# Patient Record
Sex: Male | Born: 1955
Health system: Southern US, Community
[De-identification: ages and names within clinical notes are randomized; demographics above are authoritative.]

## PROBLEM LIST (undated history)

## (undated) DIAGNOSIS — K449 Diaphragmatic hernia without obstruction or gangrene: Secondary | ICD-10-CM

## (undated) DIAGNOSIS — R51 Headache: Secondary | ICD-10-CM

## (undated) DIAGNOSIS — K219 Gastro-esophageal reflux disease without esophagitis: Secondary | ICD-10-CM

## (undated) DIAGNOSIS — J45909 Unspecified asthma, uncomplicated: Secondary | ICD-10-CM

## (undated) DIAGNOSIS — F329 Major depressive disorder, single episode, unspecified: Secondary | ICD-10-CM

## (undated) DIAGNOSIS — I10 Essential (primary) hypertension: Secondary | ICD-10-CM

## (undated) DIAGNOSIS — F319 Bipolar disorder, unspecified: Secondary | ICD-10-CM

## (undated) DIAGNOSIS — F419 Anxiety disorder, unspecified: Secondary | ICD-10-CM

## (undated) DIAGNOSIS — F32A Depression, unspecified: Secondary | ICD-10-CM

## (undated) DIAGNOSIS — M1990 Unspecified osteoarthritis, unspecified site: Secondary | ICD-10-CM

## (undated) DIAGNOSIS — J449 Chronic obstructive pulmonary disease, unspecified: Secondary | ICD-10-CM

## (undated) HISTORY — DX: Depression, unspecified: F32.A

## (undated) HISTORY — DX: Anxiety disorder, unspecified: F41.9

## (undated) HISTORY — DX: Major depressive disorder, single episode, unspecified: F32.9

## (undated) HISTORY — DX: Unspecified asthma, uncomplicated: J45.909

---

## 1998-09-25 ENCOUNTER — Encounter: Payer: Self-pay | Admitting: Emergency Medicine

## 1998-09-25 ENCOUNTER — Inpatient Hospital Stay (HOSPITAL_COMMUNITY): Admission: EM | Admit: 1998-09-25 | Discharge: 1998-09-27 | Payer: Self-pay | Admitting: Emergency Medicine

## 1998-10-01 ENCOUNTER — Inpatient Hospital Stay (HOSPITAL_COMMUNITY): Admission: AD | Admit: 1998-10-01 | Discharge: 1998-10-02 | Payer: Self-pay | Admitting: *Deleted

## 1998-10-02 ENCOUNTER — Encounter: Payer: Self-pay | Admitting: *Deleted

## 1998-11-05 ENCOUNTER — Emergency Department (HOSPITAL_COMMUNITY): Admission: EM | Admit: 1998-11-05 | Discharge: 1998-11-05 | Payer: Self-pay | Admitting: Emergency Medicine

## 1998-11-05 ENCOUNTER — Encounter: Payer: Self-pay | Admitting: Emergency Medicine

## 2000-05-08 ENCOUNTER — Emergency Department (HOSPITAL_COMMUNITY): Admission: EM | Admit: 2000-05-08 | Discharge: 2000-05-08 | Payer: Self-pay | Admitting: Emergency Medicine

## 2004-06-27 ENCOUNTER — Emergency Department (HOSPITAL_COMMUNITY): Admission: EM | Admit: 2004-06-27 | Discharge: 2004-06-27 | Payer: Self-pay | Admitting: Emergency Medicine

## 2005-01-10 ENCOUNTER — Emergency Department (HOSPITAL_COMMUNITY): Admission: EM | Admit: 2005-01-10 | Discharge: 2005-01-10 | Payer: Self-pay | Admitting: Emergency Medicine

## 2005-10-09 ENCOUNTER — Emergency Department (HOSPITAL_COMMUNITY): Admission: EM | Admit: 2005-10-09 | Discharge: 2005-10-09 | Payer: Self-pay | Admitting: Emergency Medicine

## 2005-10-14 ENCOUNTER — Emergency Department (HOSPITAL_COMMUNITY): Admission: EM | Admit: 2005-10-14 | Discharge: 2005-10-14 | Payer: Self-pay | Admitting: Emergency Medicine

## 2005-11-20 ENCOUNTER — Emergency Department (HOSPITAL_COMMUNITY): Admission: EM | Admit: 2005-11-20 | Discharge: 2005-11-20 | Payer: Self-pay | Admitting: Family Medicine

## 2005-12-06 ENCOUNTER — Emergency Department (HOSPITAL_COMMUNITY): Admission: EM | Admit: 2005-12-06 | Discharge: 2005-12-06 | Payer: Self-pay | Admitting: Emergency Medicine

## 2005-12-24 ENCOUNTER — Emergency Department (HOSPITAL_COMMUNITY): Admission: EM | Admit: 2005-12-24 | Discharge: 2005-12-24 | Payer: Self-pay | Admitting: Emergency Medicine

## 2006-03-27 ENCOUNTER — Emergency Department (HOSPITAL_COMMUNITY): Admission: EM | Admit: 2006-03-27 | Discharge: 2006-03-27 | Payer: Self-pay | Admitting: Emergency Medicine

## 2006-06-23 ENCOUNTER — Emergency Department (HOSPITAL_COMMUNITY): Admission: EM | Admit: 2006-06-23 | Discharge: 2006-06-23 | Payer: Self-pay | Admitting: Emergency Medicine

## 2006-07-25 ENCOUNTER — Emergency Department (HOSPITAL_COMMUNITY): Admission: EM | Admit: 2006-07-25 | Discharge: 2006-07-25 | Payer: Self-pay | Admitting: *Deleted

## 2006-08-01 ENCOUNTER — Emergency Department (HOSPITAL_COMMUNITY): Admission: EM | Admit: 2006-08-01 | Discharge: 2006-08-01 | Payer: Self-pay | Admitting: Emergency Medicine

## 2006-11-16 ENCOUNTER — Encounter: Admission: RE | Admit: 2006-11-16 | Discharge: 2006-11-16 | Payer: Self-pay | Admitting: Orthopaedic Surgery

## 2006-11-21 ENCOUNTER — Ambulatory Visit (HOSPITAL_BASED_OUTPATIENT_CLINIC_OR_DEPARTMENT_OTHER): Admission: RE | Admit: 2006-11-21 | Discharge: 2006-11-21 | Payer: Self-pay | Admitting: Orthopaedic Surgery

## 2008-02-11 ENCOUNTER — Emergency Department (HOSPITAL_COMMUNITY): Admission: EM | Admit: 2008-02-11 | Discharge: 2008-02-11 | Payer: Self-pay | Admitting: Emergency Medicine

## 2008-04-16 ENCOUNTER — Encounter
Admission: RE | Admit: 2008-04-16 | Discharge: 2008-04-16 | Payer: Self-pay | Admitting: Physical Medicine & Rehabilitation

## 2009-01-31 ENCOUNTER — Emergency Department (HOSPITAL_COMMUNITY): Admission: EM | Admit: 2009-01-31 | Discharge: 2009-01-31 | Payer: Self-pay | Admitting: Emergency Medicine

## 2009-02-08 ENCOUNTER — Emergency Department (HOSPITAL_COMMUNITY): Admission: EM | Admit: 2009-02-08 | Discharge: 2009-02-08 | Payer: Self-pay | Admitting: Family Medicine

## 2009-12-21 ENCOUNTER — Emergency Department (HOSPITAL_COMMUNITY): Admission: EM | Admit: 2009-12-21 | Discharge: 2009-12-21 | Payer: Self-pay | Admitting: Emergency Medicine

## 2010-09-11 ENCOUNTER — Emergency Department (HOSPITAL_COMMUNITY): Admission: EM | Admit: 2010-09-11 | Discharge: 2010-09-11 | Payer: Self-pay | Admitting: Emergency Medicine

## 2010-09-13 ENCOUNTER — Emergency Department (HOSPITAL_COMMUNITY): Admission: EM | Admit: 2010-09-13 | Discharge: 2010-09-14 | Payer: Self-pay | Admitting: Emergency Medicine

## 2010-10-19 ENCOUNTER — Emergency Department (HOSPITAL_COMMUNITY)
Admission: EM | Admit: 2010-10-19 | Discharge: 2010-10-19 | Payer: Self-pay | Source: Home / Self Care | Admitting: Emergency Medicine

## 2011-02-09 LAB — POCT URINALYSIS DIP (DEVICE)
Glucose, UA: NEGATIVE mg/dL
Nitrite: NEGATIVE

## 2011-02-17 ENCOUNTER — Ambulatory Visit (HOSPITAL_COMMUNITY)
Admission: RE | Admit: 2011-02-17 | Discharge: 2011-02-17 | Disposition: A | Discharge status: Home / Self Care | Payer: Medicaid Other | Source: Ambulatory Visit | Attending: Family Medicine | Admitting: Family Medicine

## 2011-02-17 DIAGNOSIS — J449 Chronic obstructive pulmonary disease, unspecified: Secondary | ICD-10-CM | POA: Insufficient documentation

## 2011-02-17 DIAGNOSIS — J4489 Other specified chronic obstructive pulmonary disease: Secondary | ICD-10-CM | POA: Insufficient documentation

## 2011-02-17 LAB — PULMONARY FUNCTION TEST

## 2011-03-18 NOTE — Op Note (Signed)
NAME:  Glen Lopez, Glen Lopez NO.:  [id number]   MEDICAL RECORD NO.:  [id number]          PATIENT TYPE:  AMB   LOCATION:  DSC                          FACILITY:  [facility]   PHYSICIAN:  Sharolyn Douglas, M.D.        DATE OF BIRTH:  Mar 01, 1956   DATE OF PROCEDURE:  11/21/2006  DATE OF DISCHARGE:                               OPERATIVE REPORT   DIAGNOSIS:  Lumbar spondylosis and severe back pain.   PROCEDURE:  1. Bilateral L3-S1 facette joint injections.  2. Fluoroscopic imaging used for the above injections during needle      placement.   SURGEON:  Sharolyn Douglas, MD   ASSISTANT:  None.   ANESTHESIA:  MAC plus local.   COMPLICATIONS:  None.   INDICATIONS:  The patient is a pleasant 55 year old male with chronic  severe back pain.  He has failed other conservative treatment  modalities, now presents for facette injections from L3-S1 for further  diagnostic and therapeutic purposes.  Risk, benefits, alternatives  reviewed.   PROCEDURE:  After informed consent taken to the operating room.  He  underwent sedation.  He was turned prone.  Back prepped, draped usual  sterile fashion.  Fluoroscopy brought into the field and oblique imaging  was obtained of the facette joints from L3 to S1.  22 gauge Quincke  spinal needles were advanced into the joints.  Aspiration showed no  blood.  A solution of 20 mg Depo-Medrol and 1 mL preservative-free  lidocaine injected into each joint.  The patient tolerated the procedure  well.  There were no apparent complications.  He was transferred to  recovery in stable condition.  I reviewed post injection instructions  with him.  He was comfortable and he will follow-up in 2 weeks.      Sharolyn Douglas, M.D.  Electronically Signed     MC/MEDQ  D:  11/21/2006  T:  11/21/2006  Job:  [id number]

## 2011-03-18 NOTE — Op Note (Signed)
NAME:  Glen Lopez, Glen Lopez NO.:  [id number]   MEDICAL RECORD NO.:  [id number]          PATIENT TYPE:  AMB   LOCATION:  DSC                          FACILITY:  [facility]   PHYSICIAN:  Sharolyn Douglas, M.D.        DATE OF BIRTH:  September 16, 1956   DATE OF PROCEDURE:  11/21/2006  DATE OF DISCHARGE:  11/21/2006                               OPERATIVE REPORT   DIAGNOSIS:  Lumbar spondylosis and back pain.   PROCEDURES:  1. Bilateral facette injections L3-4, L4-5 and L5-S1.  2. Fluoroscopic imaging used for needle placement in the above      injections.   SURGEON:  Sharolyn Douglas, MD   ASSISTANT:  None.   ANESTHESIA:  MAC plus local.   COMPLICATIONS:  None.   INDICATIONS:  The patient is a pleasant 55 year old male with persistent  back pain thought to be secondary to  lumbar spondylosis now presents  for facette injections as listed above.  The risks, benefits and  alternatives were reviewed.   PROCEDURE:  After informed taken to the operating room.  He was turned  prone.  He underwent sedation by anesthesia.  The fluoroscopy unit was  brought into the field.  The back was prepped and draped in the usual  sterile fashion.  Twenty-two gauge Quincke spinal needles were advanced  into the facette joints bilaterally at L3-4, L4-5 and L5-S1 using  fluoroscopy.  Aspiration showed no blood.  We then injected a solution  of 20 mg Depo-Medrol and 1 mL preservative-free lidocaine in each  facette joint.  The patient tolerated the procedure well with no  apparent complications.  Transferred to recovery in stable condition.  I  reviewed post injection instructions with him.  He will follow-up in 2  weeks.      Sharolyn Douglas, M.D.  Electronically Signed     MC/MEDQ  D:  02/08/2007  T:  02/08/2007  Job:  [id number]

## 2011-06-06 ENCOUNTER — Inpatient Hospital Stay (INDEPENDENT_AMBULATORY_CARE_PROVIDER_SITE_OTHER)
Admission: RE | Admit: 2011-06-06 | Discharge: 2011-06-06 | Disposition: A | Discharge status: Home / Self Care | Payer: Medicaid Other | Source: Ambulatory Visit | Attending: Emergency Medicine | Admitting: Emergency Medicine

## 2011-06-06 ENCOUNTER — Ambulatory Visit (INDEPENDENT_AMBULATORY_CARE_PROVIDER_SITE_OTHER): Payer: Medicaid Other

## 2011-06-06 DIAGNOSIS — S40019A Contusion of unspecified shoulder, initial encounter: Secondary | ICD-10-CM

## 2011-06-06 DIAGNOSIS — M549 Dorsalgia, unspecified: Secondary | ICD-10-CM

## 2011-06-06 DIAGNOSIS — M542 Cervicalgia: Secondary | ICD-10-CM

## 2011-06-06 DIAGNOSIS — M25519 Pain in unspecified shoulder: Secondary | ICD-10-CM

## 2011-07-26 LAB — POCT I-STAT, CHEM 8
BUN: 22
Chloride: 106
Creatinine, Ser: 1.3
HCT: 52
Potassium: 4.6
Sodium: 141

## 2011-07-28 ENCOUNTER — Emergency Department (HOSPITAL_COMMUNITY): Payer: Medicaid Other

## 2011-07-28 ENCOUNTER — Encounter (HOSPITAL_COMMUNITY): Payer: Self-pay | Admitting: Radiology

## 2011-07-28 ENCOUNTER — Emergency Department (HOSPITAL_COMMUNITY)
Admission: EM | Admit: 2011-07-28 | Discharge: 2011-07-28 | Disposition: A | Discharge status: Home / Self Care | Payer: Medicaid Other | Attending: Emergency Medicine | Admitting: Emergency Medicine

## 2011-07-28 DIAGNOSIS — Z79899 Other long term (current) drug therapy: Secondary | ICD-10-CM | POA: Insufficient documentation

## 2011-07-28 DIAGNOSIS — J438 Other emphysema: Secondary | ICD-10-CM | POA: Insufficient documentation

## 2011-07-28 DIAGNOSIS — R079 Chest pain, unspecified: Secondary | ICD-10-CM | POA: Insufficient documentation

## 2011-07-28 DIAGNOSIS — M549 Dorsalgia, unspecified: Secondary | ICD-10-CM | POA: Insufficient documentation

## 2011-07-28 DIAGNOSIS — R109 Unspecified abdominal pain: Secondary | ICD-10-CM | POA: Insufficient documentation

## 2011-07-28 DIAGNOSIS — R112 Nausea with vomiting, unspecified: Secondary | ICD-10-CM | POA: Insufficient documentation

## 2011-07-28 DIAGNOSIS — G8929 Other chronic pain: Secondary | ICD-10-CM | POA: Insufficient documentation

## 2011-07-28 HISTORY — DX: Diaphragmatic hernia without obstruction or gangrene: K44.9

## 2011-07-28 HISTORY — DX: Chronic obstructive pulmonary disease, unspecified: J44.9

## 2011-07-28 LAB — URINALYSIS, ROUTINE W REFLEX MICROSCOPIC
Glucose, UA: NEGATIVE mg/dL
Ketones, ur: NEGATIVE mg/dL
Leukocytes, UA: NEGATIVE
Protein, ur: NEGATIVE mg/dL

## 2011-07-28 LAB — CBC
HCT: 46.9 % (ref 39.0–52.0)
Hemoglobin: 16.6 g/dL (ref 13.0–17.0)
MCH: 31.9 pg (ref 26.0–34.0)
MCV: 90 fL (ref 78.0–100.0)
RBC: 5.21 MIL/uL (ref 4.22–5.81)
RDW: 13.7 % (ref 11.5–15.5)
WBC: 9.6 10*3/uL (ref 4.0–10.5)

## 2011-07-28 LAB — COMPREHENSIVE METABOLIC PANEL
ALT: 28 U/L (ref 0–53)
AST: 23 U/L (ref 0–37)
Albumin: 3.9 g/dL (ref 3.5–5.2)
Calcium: 9.2 mg/dL (ref 8.4–10.5)
Chloride: 104 mEq/L (ref 96–112)
Creatinine, Ser: 1.32 mg/dL (ref 0.50–1.35)
Total Bilirubin: 0.4 mg/dL (ref 0.3–1.2)
Total Protein: 7 g/dL (ref 6.0–8.3)

## 2011-07-28 LAB — DIFFERENTIAL
Basophils Relative: 0 % (ref 0–1)
Eosinophils Absolute: 0.1 10*3/uL (ref 0.0–0.7)
Monocytes Relative: 8 % (ref 3–12)

## 2011-07-28 MED ORDER — IOHEXOL 300 MG/ML  SOLN
100.0000 mL | Freq: Once | INTRAMUSCULAR | Status: AC | PRN
  Administered 2011-07-28: 100 mL via INTRAVENOUS

## 2012-05-22 ENCOUNTER — Emergency Department (HOSPITAL_COMMUNITY)
Admission: EM | Admit: 2012-05-22 | Discharge: 2012-05-22 | Disposition: A | Discharge status: Home / Self Care | Payer: Medicare Other | Attending: Emergency Medicine | Admitting: Emergency Medicine

## 2012-05-22 ENCOUNTER — Encounter (HOSPITAL_COMMUNITY): Payer: Self-pay | Admitting: *Deleted

## 2012-05-22 ENCOUNTER — Emergency Department (HOSPITAL_COMMUNITY): Payer: Medicare Other

## 2012-05-22 DIAGNOSIS — K209 Esophagitis, unspecified without bleeding: Secondary | ICD-10-CM

## 2012-05-22 DIAGNOSIS — R079 Chest pain, unspecified: Secondary | ICD-10-CM | POA: Insufficient documentation

## 2012-05-22 DIAGNOSIS — J4489 Other specified chronic obstructive pulmonary disease: Secondary | ICD-10-CM | POA: Insufficient documentation

## 2012-05-22 DIAGNOSIS — R1013 Epigastric pain: Secondary | ICD-10-CM | POA: Insufficient documentation

## 2012-05-22 DIAGNOSIS — R0600 Dyspnea, unspecified: Secondary | ICD-10-CM

## 2012-05-22 DIAGNOSIS — J449 Chronic obstructive pulmonary disease, unspecified: Secondary | ICD-10-CM | POA: Insufficient documentation

## 2012-05-22 DIAGNOSIS — F17200 Nicotine dependence, unspecified, uncomplicated: Secondary | ICD-10-CM | POA: Insufficient documentation

## 2012-05-22 DIAGNOSIS — R110 Nausea: Secondary | ICD-10-CM | POA: Insufficient documentation

## 2012-05-22 DIAGNOSIS — Z79899 Other long term (current) drug therapy: Secondary | ICD-10-CM | POA: Insufficient documentation

## 2012-05-22 LAB — COMPREHENSIVE METABOLIC PANEL
AST: 21 U/L (ref 0–37)
BUN: 14 mg/dL (ref 6–23)
CO2: 27 mEq/L (ref 19–32)
Calcium: 9.3 mg/dL (ref 8.4–10.5)
Chloride: 102 mEq/L (ref 96–112)
Creatinine, Ser: 1.12 mg/dL (ref 0.50–1.35)
GFR calc non Af Amer: 72 mL/min — ABNORMAL LOW (ref >90)
Total Bilirubin: 0.8 mg/dL (ref 0.3–1.2)

## 2012-05-22 LAB — CBC WITH DIFFERENTIAL/PLATELET
Eosinophils Absolute: 0.1 10*3/uL (ref 0.0–0.7)
Hemoglobin: 17.5 g/dL — ABNORMAL HIGH (ref 13.0–17.0)
Lymphocytes Relative: 21 % (ref 12–46)
Lymphs Abs: 2.4 10*3/uL (ref 0.7–4.0)
MCH: 32.4 pg (ref 26.0–34.0)
Monocytes Relative: 7 % (ref 3–12)
Neutro Abs: 8.1 10*3/uL — ABNORMAL HIGH (ref 1.7–7.7)
Neutrophils Relative %: 71 % (ref 43–77)
Platelets: 147 10*3/uL — ABNORMAL LOW (ref 150–400)
RBC: 5.4 MIL/uL (ref 4.22–5.81)
WBC: 11.4 10*3/uL — ABNORMAL HIGH (ref 4.0–10.5)

## 2012-05-22 LAB — LIPASE, BLOOD: Lipase: 14 U/L (ref 11–59)

## 2012-05-22 LAB — POCT I-STAT TROPONIN I: Troponin i, poc: 0.05 ng/mL (ref 0.00–0.08)

## 2012-05-22 LAB — TROPONIN I: Troponin I: <0.3 ng/mL (ref <0.30)

## 2012-05-22 MED ORDER — GI COCKTAIL ~~LOC~~
30.0000 mL | Freq: Once | ORAL | Status: AC
  Administered 2012-05-22: 30 mL via ORAL
  Filled 2012-05-22: qty 30

## 2012-05-22 MED ORDER — SUCRALFATE 1 GM/10ML PO SUSP: 1.0000 g | Freq: Four times a day (QID) | ORAL | Status: DC

## 2012-05-22 MED ORDER — OMEPRAZOLE 20 MG PO CPDR: 20.0000 mg | DELAYED_RELEASE_CAPSULE | Freq: Every day | ORAL | Status: DC

## 2012-05-22 NOTE — ED Provider Notes (Signed)
History     [name]: [id number]  Arrival date & time 05/22/12  1655   First MD Initiated Contact with Patient 05/22/12 1953      Chief Complaint  Patient presents with  . Chest Pain    (Consider location/radiation/quality/duration/timing/severity/associated sxs/prior treatment) HPI The patient presents with concerns of chest pain.  He notes that his symptoms began 4 days ago, while he was on vacation.  He states that his symptoms consist of burning, aching pain in his epigastrium and sternum.  The pain has become worse over the past few days, and is most pronounced when lying flat.  He notes minor improvement with walking, upright positioning. The patient notes concurrent nausea.  He notes no significant improvement with oral medication, such as H2 blockers, or PPI. He denies ongoing other dyspnea, lightheadedness, syncope, confusion, disorientation. The patient continues to smoke, though he smokes 0.5 packs per day, down from 3 packs per day. Past Medical History  Diagnosis Date  . COPD (chronic obstructive pulmonary disease)   . Hiatal hernia   . Emphysema     History reviewed. No pertinent past surgical history.  No family history on file.  History  Substance Use Topics  . Smoking status: Current Everyday Smoker  . Smokeless tobacco: Not on file  . Alcohol Use: No      Review of Systems  Constitutional:       Per HPI, otherwise negative  HENT:       Per HPI, otherwise negative  Eyes: Negative.   Respiratory:       Per HPI, otherwise negative  Cardiovascular:       Per HPI, otherwise negative  Gastrointestinal: Negative for vomiting.  Genitourinary: Negative.   Musculoskeletal:       Per HPI, otherwise negative  Skin: Negative.   Neurological: Negative for syncope.    Allergies  Codeine  Home Medications   Current Outpatient Rx  Name Route Sig Dispense Refill  . ALBUTEROL SULFATE HFA 108 (90 BASE) MCG/ACT IN AERS Inhalation Inhale 2 puffs into the lungs  every 4 (four) hours as needed. For shortness of breath    . ALBUTEROL SULFATE (2.5 MG/3ML) 0.083% IN NEBU Nebulization Take 2.5 mg by nebulization every 6 (six) hours as needed. For shortness of breath    . ALPRAZOLAM 1 MG PO TABS Oral Take 1 mg by mouth 2 (two) times daily.    . BUDESONIDE-FORMOTEROL FUMARATE 160-4.5 MCG/ACT IN AERO Inhalation Inhale 2 puffs into the lungs 2 (two) times daily.    Marland Kitchen HYDROCODONE-ACETAMINOPHEN 10-325 MG PO TABS Oral Take 1 tablet by mouth 3 (three) times daily.    Marland Kitchen LITHIUM CARBONATE 300 MG PO CAPS Oral Take 300 mg by mouth 3 (three) times daily with meals.      BP 156/88  Pulse 95  Temp 97.8 F (36.6 C) (Oral)  Resp 18  SpO2 92%  Physical Exam  Nursing note and vitals reviewed. Constitutional: He is oriented to person, place, and time. He appears well-developed. No distress.  HENT:  Head: Normocephalic and atraumatic.  Eyes: Conjunctivae and EOM are normal.  Cardiovascular: Normal rate and regular rhythm.   Pulmonary/Chest: Effort normal. No stridor. No respiratory distress.  Abdominal: He exhibits no distension. There is tenderness in the epigastric area.  Musculoskeletal: He exhibits no edema.  Neurological: He is alert and oriented to person, place, and time.  Skin: Skin is warm and dry.  Psychiatric: He has a normal mood and affect.    ED  Course  Procedures (including critical care time)  Labs Reviewed  COMPREHENSIVE METABOLIC PANEL - Abnormal; Notable for the following:    Glucose, Bld 142 (*)     GFR calc non Af Amer 72 (*)     GFR calc Af Amer 84 (*)     All other components within normal limits  CBC WITH DIFFERENTIAL - Abnormal; Notable for the following:    WBC 11.4 (*)     Hemoglobin 17.5 (*)     Platelets 147 (*)     Neutro Abs 8.1 (*)     All other components within normal limits  POCT I-STAT TROPONIN I  TROPONIN I  LIPASE, BLOOD   Dg Chest 2 View  05/22/2012  *RADIOLOGY REPORT*  Clinical Data: Chest pain  CHEST - 2 VIEW   Comparison: 07/28/2011  Findings: COPD with scarring in the lung bases.  Negative for pneumonia.  Negative for heart failure or effusion.  Negative for mass lesion.  IMPRESSION: COPD with pulmonary scarring.  No acute abnormality.  Original Report Authenticated By: Camelia Phenes, M.D.     No diagnosis found.   Cardiac 75 sinus rhythm normal Pulse oximetry 97% room air normal   Date: 05/22/2012  Rate: 99  Rhythm: normal sinus rhythm  QRS Axis: normal  Intervals: normal  ST/T Wave abnormalities: normal  Conduction Disutrbances:none  Narrative Interpretation:   Old EKG Reviewed: none available Unremarkable ecg   On re-eval the patient refers to ED staff as "miracle workers" and notes that he is entirely asymptomatic.  MDM  This 56 year old male presents with days of epigastric and chest pain.  On my exam the patient is in no distress with unremarkable vital signs.  Given the patient's description of symptoms for days there is low suspicion for acute ongoing ischemia, this is also supported by the patient's endorsement of pain that is relieved with exertion.  The patient's description of pain is worse when supine, his history of GERD and is suggestive of acute esophagitis.  The patient's symptoms resolved entirely following a GI cocktail and following an unremarkable laboratory evaluation he was discharged with instructions to follow up with a gastroenterologist as well as his primary care physician for a still necessary cardiac evaluation.    Gerhard Munch, MD 05/22/12 2350

## 2012-05-22 NOTE — ED Notes (Signed)
Pt had some chest pain last week and felt like indigestion.  Pt states last nite it hit him and laid it back and then it came back again.  Pt states it is hurting now [place] beach.  Pt states he has a hernia behind his heart.

## 2012-05-22 NOTE — ED Notes (Signed)
Pt st's pain has subsided after GI cocktail.  Dr. Jeraldine Loots made aware.

## 2012-07-16 ENCOUNTER — Encounter (HOSPITAL_COMMUNITY): Payer: Self-pay

## 2012-07-16 ENCOUNTER — Emergency Department (INDEPENDENT_AMBULATORY_CARE_PROVIDER_SITE_OTHER)
Admission: EM | Admit: 2012-07-16 | Discharge: 2012-07-16 | Disposition: A | Discharge status: Home / Self Care | Payer: Medicare Other | Source: Home / Self Care | Attending: Family Medicine | Admitting: Family Medicine

## 2012-07-16 DIAGNOSIS — M25569 Pain in unspecified knee: Secondary | ICD-10-CM

## 2012-07-16 DIAGNOSIS — M25561 Pain in right knee: Secondary | ICD-10-CM

## 2012-07-16 MED ORDER — KETOROLAC TROMETHAMINE 60 MG/2ML IM SOLN
60.0000 mg | Freq: Once | INTRAMUSCULAR | Status: AC
  Administered 2012-07-16: 60 mg via INTRAMUSCULAR

## 2012-07-16 MED ORDER — KETOROLAC TROMETHAMINE 60 MG/2ML IM SOLN
INTRAMUSCULAR | Status: AC
  Filled 2012-07-16: qty 2

## 2012-07-16 MED ORDER — MELOXICAM 7.5 MG PO TABS: 7.5000 mg | ORAL_TABLET | Freq: Every day | ORAL | Status: DC

## 2012-07-16 NOTE — ED Notes (Signed)
C/O Right knee pain, states that he tripped over a cable wire at home yesterday, pain radiates from knee down.
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
## 2012-07-27 NOTE — ED Provider Notes (Signed)
Medical screening examination/treatment/procedure(s) were performed by resident physician or non-physician practitioner and as supervising physician I was immediately available for consultation/collaboration.   Barkley Bruns MD.    Linna Hoff, MD 07/27/12 [phone]

## 2013-01-29 HISTORY — PX: OTHER SURGICAL HISTORY: SHX169

## 2013-08-29 ENCOUNTER — Encounter (HOSPITAL_COMMUNITY): Payer: Self-pay | Admitting: Emergency Medicine

## 2013-08-29 ENCOUNTER — Emergency Department (HOSPITAL_COMMUNITY)
Admission: EM | Admit: 2013-08-29 | Discharge: 2013-08-29 | Disposition: A | Discharge status: Home / Self Care | Payer: Medicare Other | Attending: Emergency Medicine | Admitting: Emergency Medicine

## 2013-08-29 ENCOUNTER — Emergency Department (HOSPITAL_COMMUNITY): Payer: Medicare Other

## 2013-08-29 DIAGNOSIS — J438 Other emphysema: Secondary | ICD-10-CM | POA: Insufficient documentation

## 2013-08-29 DIAGNOSIS — IMO0002 Reserved for concepts with insufficient information to code with codable children: Secondary | ICD-10-CM | POA: Insufficient documentation

## 2013-08-29 DIAGNOSIS — S40011A Contusion of right shoulder, initial encounter: Secondary | ICD-10-CM

## 2013-08-29 DIAGNOSIS — Z79899 Other long term (current) drug therapy: Secondary | ICD-10-CM | POA: Insufficient documentation

## 2013-08-29 DIAGNOSIS — Z8719 Personal history of other diseases of the digestive system: Secondary | ICD-10-CM | POA: Insufficient documentation

## 2013-08-29 DIAGNOSIS — F17200 Nicotine dependence, unspecified, uncomplicated: Secondary | ICD-10-CM | POA: Insufficient documentation

## 2013-08-29 DIAGNOSIS — Y9301 Activity, walking, marching and hiking: Secondary | ICD-10-CM | POA: Insufficient documentation

## 2013-08-29 DIAGNOSIS — S8391XA Sprain of unspecified site of right knee, initial encounter: Secondary | ICD-10-CM

## 2013-08-29 DIAGNOSIS — S40019A Contusion of unspecified shoulder, initial encounter: Secondary | ICD-10-CM | POA: Insufficient documentation

## 2013-08-29 DIAGNOSIS — Y92410 Unspecified street and highway as the place of occurrence of the external cause: Secondary | ICD-10-CM | POA: Insufficient documentation
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
## 2013-08-29 NOTE — ED Notes (Signed)
Pa  at bedside. 
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
## 2013-09-01 NOTE — ED Provider Notes (Signed)
Medical screening examination/treatment/procedure(s) were performed by non-physician practitioner and as supervising physician I was immediately available for consultation/collaboration.  Kitara Hebb M Lathyn Griggs, MD 09/01/13 1842 

## 2013-10-31 ENCOUNTER — Inpatient Hospital Stay (HOSPITAL_COMMUNITY)
Admission: EM | Admit: 2013-10-31 | Discharge: 2013-11-01 | DRG: 192 | Disposition: A | Discharge status: Home / Self Care | Payer: Medicare Other | Attending: Internal Medicine | Admitting: Internal Medicine

## 2013-10-31 ENCOUNTER — Emergency Department (HOSPITAL_COMMUNITY): Payer: Medicare Other

## 2013-10-31 ENCOUNTER — Encounter (HOSPITAL_COMMUNITY): Payer: Self-pay | Admitting: Emergency Medicine

## 2013-10-31 DIAGNOSIS — K219 Gastro-esophageal reflux disease without esophagitis: Secondary | ICD-10-CM | POA: Diagnosis present

## 2013-10-31 DIAGNOSIS — R0902 Hypoxemia: Secondary | ICD-10-CM | POA: Diagnosis present

## 2013-10-31 DIAGNOSIS — I10 Essential (primary) hypertension: Secondary | ICD-10-CM | POA: Diagnosis present

## 2013-10-31 DIAGNOSIS — J441 Chronic obstructive pulmonary disease with (acute) exacerbation: Principal | ICD-10-CM | POA: Diagnosis present

## 2013-10-31 DIAGNOSIS — J439 Emphysema, unspecified: Secondary | ICD-10-CM | POA: Diagnosis present

## 2013-10-31 DIAGNOSIS — F17200 Nicotine dependence, unspecified, uncomplicated: Secondary | ICD-10-CM | POA: Diagnosis present

## 2013-10-31 DIAGNOSIS — F319 Bipolar disorder, unspecified: Secondary | ICD-10-CM | POA: Diagnosis present

## 2013-10-31 HISTORY — DX: Headache: R51

## 2013-10-31 HISTORY — DX: Essential (primary) hypertension: I10

## 2013-10-31 HISTORY — DX: Gastro-esophageal reflux disease without esophagitis: K21.9

## 2013-10-31 HISTORY — DX: Bipolar disorder, unspecified: F31.9

## 2013-10-31 HISTORY — DX: Unspecified osteoarthritis, unspecified site: M19.90

## 2013-10-31 LAB — BASIC METABOLIC PANEL
BUN: 19 mg/dL (ref 6–23)
CHLORIDE: 96 meq/L (ref 96–112)
CO2: 29 meq/L (ref 19–32)
CREATININE: 1.19 mg/dL (ref 0.50–1.35)
Calcium: 9.3 mg/dL (ref 8.4–10.5)
GFR calc Af Amer: 77 mL/min — ABNORMAL LOW (ref >90)
GFR calc non Af Amer: 66 mL/min — ABNORMAL LOW (ref >90)
Glucose, Bld: 110 mg/dL — ABNORMAL HIGH (ref 70–99)
Potassium: 4.7 mEq/L (ref 3.7–5.3)
Sodium: 139 mEq/L (ref 137–147)

## 2013-10-31 LAB — POCT I-STAT 3, ART BLOOD GAS (G3+)
ACID-BASE EXCESS: 2 mmol/L (ref 0.0–2.0)
Bicarbonate: 30.7 mEq/L — ABNORMAL HIGH (ref 20.0–24.0)
O2 Saturation: 98 %
Patient temperature: 98.9
TCO2: 32 mmol/L (ref 0–100)
pCO2 arterial: 59.3 mmHg (ref 35.0–45.0)
pH, Arterial: 7.324 — ABNORMAL LOW (ref 7.350–7.450)
pO2, Arterial: 120 mmHg — ABNORMAL HIGH (ref 80.0–100.0)

## 2013-10-31 LAB — CBC
HCT: 52.3 % — ABNORMAL HIGH (ref 39.0–52.0)
Hemoglobin: 18.1 g/dL — ABNORMAL HIGH (ref 13.0–17.0)
MCH: 32.6 pg (ref 26.0–34.0)
MCHC: 34.6 g/dL (ref 30.0–36.0)
MCV: 94.1 fL (ref 78.0–100.0)
PLATELETS: 132 10*3/uL — AB (ref 150–400)
RBC: 5.56 MIL/uL (ref 4.22–5.81)
RDW: 14.6 % (ref 11.5–15.5)
WBC: 15.5 10*3/uL — AB (ref 4.0–10.5)

## 2013-10-31 LAB — PRO B NATRIURETIC PEPTIDE: Pro B Natriuretic peptide (BNP): 125.2 pg/mL — ABNORMAL HIGH (ref 0–125)

## 2013-10-31 LAB — POCT I-STAT TROPONIN I: Troponin i, poc: 0.02 ng/mL (ref 0.00–0.08)

## 2013-10-31 MED ORDER — ACETAMINOPHEN 500 MG PO TABS
1000.0000 mg | ORAL_TABLET | Freq: Once | ORAL | Status: AC
Start: 2013-10-31 — End: 2013-10-31
  Administered 2013-10-31: 1000 mg via ORAL
  Filled 2013-10-31: qty 2

## 2013-10-31 MED ORDER — ALPRAZOLAM 0.5 MG PO TABS
1.0000 mg | ORAL_TABLET | Freq: Three times a day (TID) | ORAL | Status: DC | PRN
  Administered 2013-10-31 – 2013-11-01 (×2): 1 mg via ORAL
  Filled 2013-10-31 (×2): qty 4
  Filled 2013-10-31: qty 2

## 2013-10-31 MED ORDER — ALBUTEROL SULFATE (2.5 MG/3ML) 0.083% IN NEBU
2.5000 mg | INHALATION_SOLUTION | RESPIRATORY_TRACT | Status: DC | PRN
  Administered 2013-11-01: 2.5 mg via RESPIRATORY_TRACT
  Filled 2013-10-31: qty 3

## 2013-10-31 MED ORDER — ALBUTEROL SULFATE (2.5 MG/3ML) 0.083% IN NEBU
5.0000 mg | INHALATION_SOLUTION | Freq: Once | RESPIRATORY_TRACT | Status: AC
  Administered 2013-10-31: 5 mg via RESPIRATORY_TRACT
  Filled 2013-10-31: qty 6

## 2013-10-31 MED ORDER — BUDESONIDE-FORMOTEROL FUMARATE 160-4.5 MCG/ACT IN AERO
2.0000 | INHALATION_SPRAY | Freq: Two times a day (BID) | RESPIRATORY_TRACT | Status: DC
  Administered 2013-10-31: 2 via RESPIRATORY_TRACT
  Filled 2013-10-31: qty 6

## 2013-10-31 MED ORDER — IPRATROPIUM BROMIDE 0.02 % IN SOLN
0.5000 mg | RESPIRATORY_TRACT | Status: DC | PRN
  Administered 2013-11-01: 0.5 mg via RESPIRATORY_TRACT
  Filled 2013-10-31: qty 2.5

## 2013-10-31 MED ORDER — HYDROCODONE-ACETAMINOPHEN 10-325 MG PO TABS
1.0000 | ORAL_TABLET | Freq: Three times a day (TID) | ORAL | Status: DC | PRN
  Administered 2013-10-31 – 2013-11-01 (×2): 1 via ORAL
  Filled 2013-10-31 (×2): qty 1

## 2013-10-31 MED ORDER — LEVOFLOXACIN IN D5W 750 MG/150ML IV SOLN
750.0000 mg | Freq: Once | INTRAVENOUS | Status: AC
  Administered 2013-10-31: 750 mg via INTRAVENOUS
  Filled 2013-10-31: qty 150

## 2013-10-31 MED ORDER — ENOXAPARIN SODIUM 40 MG/0.4ML ~~LOC~~ SOLN
40.0000 mg | SUBCUTANEOUS | Status: DC
  Administered 2013-10-31: 40 mg via SUBCUTANEOUS
  Filled 2013-10-31 (×2): qty 0.4

## 2013-10-31 MED ORDER — IPRATROPIUM BROMIDE 0.02 % IN SOLN
0.5000 mg | Freq: Once | RESPIRATORY_TRACT | Status: AC
  Administered 2013-10-31: 0.5 mg via RESPIRATORY_TRACT
  Filled 2013-10-31: qty 2.5

## 2013-10-31 MED ORDER — PREDNISONE 20 MG PO TABS
60.0000 mg | ORAL_TABLET | Freq: Once | ORAL | Status: AC
  Administered 2013-10-31: 60 mg via ORAL
  Filled 2013-10-31: qty 3

## 2013-10-31 MED ORDER — LITHIUM CARBONATE 300 MG PO CAPS
300.0000 mg | ORAL_CAPSULE | Freq: Three times a day (TID) | ORAL | Status: DC
  Filled 2013-10-31 (×3): qty 1

## 2013-10-31 MED ORDER — LEVOFLOXACIN IN D5W 750 MG/150ML IV SOLN
750.0000 mg | INTRAVENOUS | Status: DC
  Filled 2013-10-31: qty 150

## 2013-10-31 MED ORDER — LITHIUM CARBONATE 300 MG PO CAPS: 300.0000 mg | ORAL_CAPSULE | Freq: Three times a day (TID) | ORAL | Status: DC

## 2013-10-31 MED ORDER — ALBUTEROL (5 MG/ML) CONTINUOUS INHALATION SOLN
10.0000 mg/h | INHALATION_SOLUTION | Freq: Once | RESPIRATORY_TRACT | Status: AC
  Administered 2013-10-31: 10 mg/h via RESPIRATORY_TRACT
  Filled 2013-10-31: qty 20

## 2013-10-31 MED ORDER — PANTOPRAZOLE SODIUM 40 MG PO TBEC: 40.0000 mg | DELAYED_RELEASE_TABLET | Freq: Every day | ORAL | Status: DC

## 2013-10-31 MED ORDER — ALBUTEROL SULFATE HFA 108 (90 BASE) MCG/ACT IN AERS: 2.0000 | INHALATION_SPRAY | RESPIRATORY_TRACT | Status: DC | PRN

## 2013-10-31 MED ORDER — METHYLPREDNISOLONE SODIUM SUCC 125 MG IJ SOLR
60.0000 mg | Freq: Four times a day (QID) | INTRAMUSCULAR | Status: DC
  Administered 2013-10-31 – 2013-11-01 (×3): 60 mg via INTRAVENOUS
  Filled 2013-10-31 (×4): qty 0.96
  Filled 2013-10-31: qty 2
  Filled 2013-10-31: qty 0.96
  Filled 2013-10-31: qty 2
  Filled 2013-10-31: qty 0.96

## 2013-10-31 MED ORDER — IPRATROPIUM-ALBUTEROL 0.5-2.5 (3) MG/3ML IN SOLN
3.0000 mL | RESPIRATORY_TRACT | Status: DC
  Administered 2013-10-31 – 2013-11-01 (×3): 3 mL via RESPIRATORY_TRACT
  Filled 2013-10-31 (×3): qty 3

## 2013-10-31 MED ORDER — PANTOPRAZOLE SODIUM 40 MG PO TBEC
40.0000 mg | DELAYED_RELEASE_TABLET | Freq: Every day | ORAL | Status: DC
  Filled 2013-10-31 (×2): qty 1

## 2013-10-31 NOTE — ED Notes (Signed)
The pt has copd and for 2-3 days he has had diff breathing also.  headache

## 2013-10-31 NOTE — ED Notes (Signed)
Pt complaining of back pain, chronic pain.

## 2013-10-31 NOTE — ED Notes (Signed)
Nasal 02 placed at 2 liters

## 2013-10-31 NOTE — H&P (Signed)
[facility] History and Physical  Patient: Glen Lopez  JWJ:[id number]  DOB: 07/10/1956  DOS: the patient was seen and examined on 10/31/2013 PCP: Norberto SorensonSHAW,EVA, MD  Chief Complaint: Shortness of breath and cough  HPI: Glen Lopez is a 58 y.o. male with Past medical history of COPD, hypertension, bipolar disorder. The patient is coming from home. The patient presented with the complaints of shortness of breath and cough that has been ongoing since last 4-5 days. He mentions he has some baseline cough and baseline shortness of breath but since last few days this is progressively worsening associated with whitish sputum production.2 weeks ago he had grown children having similar symptoms.today he mentions that he was feeling disoriented and clumsy and was not able to fully understand his surroundings and was doing things that he would not do it normally.he also complains of frontal headache with some runny nose. Pt denies any fever, chills, chest pain, palpitation, orthopnea, PND, nausea, vomiting, abdominal pain, diarrhea, constipation, active bleeding, burning urination, dizziness, pedal edema,  focal neurological deficit. He is an active smoker smokes one pack of cigarettes a day. No alcohol abuse no signs of aspiration. No recent travel no immobilization  Review of Systems: as mentioned in the history of present illness.  A Comprehensive review of the other systems is negative.  Past Medical History  Diagnosis Date  . COPD (chronic obstructive pulmonary disease)   . Hiatal hernia   . Emphysema   . Emphysema    Past Surgical History  Procedure Laterality Date  . Artheroscopic knee surgery r Right 01/2013   Social History:  reports that he has been smoking Cigarettes.  He has been smoking about 0.50 packs per day. He does not have any smokeless tobacco history on file. He reports that he does not drink alcohol or use illicit drugs. Independent for most of his   ADL.  Allergies  Allergen Reactions  . Codeine Nausea And Vomiting    No family history on file.  Prior to Admission medications   Medication Sig Start Date End Date Taking? Authorizing Provider  albuterol (PROVENTIL HFA;VENTOLIN HFA) 108 (90 BASE) MCG/ACT inhaler Inhale 2 puffs into the lungs every 4 (four) hours as needed. For shortness of breath   Yes Historical Provider, MD  albuterol (PROVENTIL) (2.5 MG/3ML) 0.083% nebulizer solution Take 2.5 mg by nebulization every 6 (six) hours as needed. For shortness of breath   Yes Historical Provider, MD  ALPRAZolam Prudy Feeler(XANAX) 1 MG tablet Take 1 mg by mouth 3 (three) times daily as needed for anxiety.    Yes Historical Provider, MD  atenolol-chlorthalidone (TENORETIC) 100-25 MG per tablet Take 0.5 tablets by mouth daily.   Yes Historical Provider, MD  budesonide-formoterol (SYMBICORT) 160-4.5 MCG/ACT inhaler Inhale 2 puffs into the lungs 2 (two) times daily.   Yes Historical Provider, MD  HYDROcodone-acetaminophen (NORCO) 10-325 MG per tablet Take 1 tablet by mouth 3 (three) times daily as needed for moderate pain.    Yes Historical Provider, MD  lithium carbonate 300 MG capsule Take 300 mg by mouth 3 (three) times daily with meals.   Yes Historical Provider, MD  omeprazole (PRILOSEC) 20 MG capsule Take 20 mg by mouth daily as needed (for acid reflux/indigestion).   Yes Historical Provider, MD    Physical Exam: Filed Vitals:   10/31/13 1846 10/31/13 1900 10/31/13 1930 10/31/13 2000  BP: 136/69 127/67 124/67 121/74  Pulse: 119 113 116 111  Temp:      Resp: 20 28  28 24  Height:      Weight:      SpO2: 89% 85% 82% 95%    General: Alert, Awake and Oriented to Time, Place and Person. Appear in moderate distress Eyes: PERRL ENT: Oral Mucosa clear moist. Neck: no  JVD Cardiovascular: S1 and S2 Present, No  Murmur, Peripheral Pulses Present Respiratory: Bilateral Air entry equal and Decreased, no Crackles, bilateral expiratory wheezes Abdomen:  Bowel Sound Present, Soft and Non tender Skin: no  Rash Extremities: No  Pedal edema,  no calf tenderness Neurologic: Grossly Unremarkable.  Labs on Admission:  CBC:  Recent Labs Lab 10/31/13 1645  WBC 15.5*  HGB 18.1*  HCT 52.3*  MCV 94.1  PLT 132*    CMP     Component Value Date/Time   NA 139 10/31/2013 1645   K 4.7 10/31/2013 1645   CL 96 10/31/2013 1645   CO2 29 10/31/2013 1645   GLUCOSE 110* 10/31/2013 1645   BUN 19 10/31/2013 1645   CREATININE 1.19 10/31/2013 1645   CALCIUM 9.3 10/31/2013 1645   PROT 7.4 05/22/2012 1736   ALBUMIN 4.0 05/22/2012 1736   AST 21 05/22/2012 1736   ALT 25 05/22/2012 1736   ALKPHOS 76 05/22/2012 1736   BILITOT 0.8 05/22/2012 1736   GFRNONAA 66* 10/31/2013 1645   GFRAA 77* 10/31/2013 1645  BNP (last 3 results)  Recent Labs  10/31/13 1645  PROBNP 125.2*    Radiological Exams on Admission: Dg Chest Portable 1 View  10/31/2013   CLINICAL DATA:  Shortness of breath.  EXAM: PORTABLE CHEST - 1 VIEW  COMPARISON:  05/22/2012.  FINDINGS: The cardiac silhouette, mediastinal and hilar contours are within normal limits and stable. The lungs are clear. Mild hyperinflation and probable emphysematous changes. No pleural effusion or pneumothorax. The bony thorax is intact.  IMPRESSION: No acute cardiopulmonary findings.   Electronically Signed   By: Loralie Champagne M.D.   On: 10/31/2013 17:22    EKG: Independently reviewed. sinus tachycardia.  Assessment/Plan Principal Problem:   COPD exacerbation Active Problems:   Essential hypertension, benign   Bipolar disorder, unspecified   1. COPD exacerbation  with an active smoking history the patient is presenting with complaints of cough and shortness of breath with extensive wheezing and severe hypoxia with significantly decreased breath sounds bilaterally. it appears that he has COPD exacerbation of the same at present . With this I will admit him to telemetry IV Solu-Medrol 60 mg every 6 hours , DuoNeb, IV  levofloxacin, oxygen as needed to maintain saturations 88-92%. His ABG does show hypercarbia but it appears chronic and compensated. He is requiring more than 50% FiO2 to maintain his saturations in adequate range at present, he may require closer monitoring If it does not improve with current treatment.  2.hypertension  At present holding his antihypertensive  May Continue his atenolol and chlorthalidone from tomorrow   3. bipolar disorder  Continue lithium   4. GERD   continue Protonix  DVT Prophylaxis: subcutaneous HeparinNutrition: Cardiac diet   Code Status: Full  Family Communication:  wife  was present at bedside, opportunity was given to ask question and all questions were answered satisfactorily at the time of interview. Disposition: Admitted to inpatient in telemetry unit.  Author: Lynden Oxford, MD [facility] Pager: [phone] 10/31/2013, 8:11 PM    If 7PM-7AM, please contact night-coverage [email or web address] Password [email or web address]

## 2013-10-31 NOTE — ED Notes (Addendum)
RT at bedside to change patient to a ventri mask off from NRB. 40% O2.

## 2013-10-31 NOTE — ED Notes (Signed)
Spoke with Admitting MD about lithium medication, dose change and floor request of Stepdown.

## 2013-10-31 NOTE — ED Notes (Signed)
Attempted report. Charge RN will come down to assess patient.
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
## 2013-11-01 ENCOUNTER — Encounter (HOSPITAL_COMMUNITY): Payer: Self-pay | Admitting: General Practice

## 2013-11-01 LAB — COMPREHENSIVE METABOLIC PANEL
ALBUMIN: 3.4 g/dL — AB (ref 3.5–5.2)
ALT: 32 U/L (ref 0–53)
AST: 18 U/L (ref 0–37)
Alkaline Phosphatase: 73 U/L (ref 39–117)
BUN: 20 mg/dL (ref 6–23)
CALCIUM: 9.1 mg/dL (ref 8.4–10.5)
CO2: 27 meq/L (ref 19–32)
Chloride: 97 mEq/L (ref 96–112)
Creatinine, Ser: 1 mg/dL (ref 0.50–1.35)
GFR calc Af Amer: >90 mL/min (ref >90)
GFR calc non Af Amer: 82 mL/min — ABNORMAL LOW (ref >90)
Glucose, Bld: 220 mg/dL — ABNORMAL HIGH (ref 70–99)
Potassium: 4.1 mEq/L (ref 3.7–5.3)
SODIUM: 139 meq/L (ref 137–147)
Total Bilirubin: 0.7 mg/dL (ref 0.3–1.2)
Total Protein: 7.2 g/dL (ref 6.0–8.3)

## 2013-11-01 LAB — CBC WITH DIFFERENTIAL/PLATELET
BASOS PCT: 0 % (ref 0–1)
Basophils Absolute: 0 10*3/uL (ref 0.0–0.1)
EOS PCT: 0 % (ref 0–5)
Eosinophils Absolute: 0 10*3/uL (ref 0.0–0.7)
HEMATOCRIT: 48.6 % (ref 39.0–52.0)
HEMOGLOBIN: 17 g/dL (ref 13.0–17.0)
Lymphocytes Relative: 8 % — ABNORMAL LOW (ref 12–46)
Lymphs Abs: 0.8 10*3/uL (ref 0.7–4.0)
MCH: 32.9 pg (ref 26.0–34.0)
MCHC: 35 g/dL (ref 30.0–36.0)
MCV: 94 fL (ref 78.0–100.0)
Monocytes Absolute: 0.1 10*3/uL (ref 0.1–1.0)
Monocytes Relative: 1 % — ABNORMAL LOW (ref 3–12)
NEUTROS PCT: 91 % — AB (ref 43–77)
Neutro Abs: 9.3 10*3/uL — ABNORMAL HIGH (ref 1.7–7.7)
Platelets: 124 10*3/uL — ABNORMAL LOW (ref 150–400)
RBC: 5.17 MIL/uL (ref 4.22–5.81)
RDW: 14.1 % (ref 11.5–15.5)
WBC: 10.2 10*3/uL (ref 4.0–10.5)

## 2013-11-01 LAB — LEGIONELLA ANTIGEN, URINE: Legionella Antigen, Urine: NEGATIVE

## 2013-11-01 LAB — STREP PNEUMONIAE URINARY ANTIGEN: STREP PNEUMO URINARY ANTIGEN: NEGATIVE

## 2013-11-01 MED ORDER — PREDNISONE 10 MG PO TABS: 10.0000 mg | ORAL_TABLET | Freq: Every day | ORAL | Status: DC

## 2013-11-01 MED ORDER — LITHIUM CARBONATE 300 MG PO CAPS
300.0000 mg | ORAL_CAPSULE | Freq: Three times a day (TID) | ORAL | Status: DC
  Administered 2013-11-01: 300 mg via ORAL
  Filled 2013-11-01 (×4): qty 1

## 2013-11-01 MED ORDER — LEVOFLOXACIN 750 MG PO TABS: 750.0000 mg | ORAL_TABLET | Freq: Every day | ORAL | Status: DC

## 2013-11-01 NOTE — ED Provider Notes (Addendum)
I saw and evaluated the patient, reviewed the resident's note and I agree with the findings and plan.   .Face to face Exam:  General:  Awake HEENT:  Atraumatic Resp:  Wheezing Abd:  Nondistended Neuro:No focal weakness   I reviewed and interpreted the EKG with resident.  Nelia Shiobert L Baylin Cabal, MD 11/21/13 [phone]

## 2013-11-01 NOTE — Discharge Summary (Signed)
Physician Discharge Summary  Glen Lopez ZOX:[id number] DOB: 02/25/1956 DOA: 10/31/2013  PCP: Norberto Sorenson, MD  Admit date: 10/31/2013 Discharge date: 11/01/2013  Time spent: >35 minutes  Recommendations for Outpatient Follow-up:  F/u with PCP next week Discharge Diagnoses:  Principal Problem:   COPD exacerbation Active Problems:   Essential hypertension, benign   Bipolar disorder, unspecified   Discharge Condition: stable   Diet recommendation: heart healthy   Filed Weights   10/31/13 1628 10/31/13 2317 11/01/13 0456  Weight: 77.111 kg (170 lb) 82.01 kg (180 lb 12.8 oz) 81.557 kg (179 lb 12.8 oz)    History of present illness:  58 y.o. male with Past medical history of COPD, hypertension, bipolar disorder. The patient is coming from home. The patient presented with the complaints of shortness of breath and cough that has been ongoing since last 4-5 days. He mentions he has some baseline cough and baseline shortness of breath but since last few days this is progressively worsening associated with whitish sputum production.2 weeks ago he had grown children having similar symptoms   Hospital Course:  Patient started on IV steroids, antibiotics, bronchodilators with clinical improvement; he is requesting top go home today; recommended to cont PO steroid taper, atx, inhalers, and f/u with PCP next week; d/w to stop smoking; will perform desat study prior to discharge    Procedures:  CXR (i.e. Studies not automatically included, echos, thoracentesis, etc; not x-rays)  Consultations:  None   Discharge Exam: Filed Vitals:   11/01/13 0853  BP: 140/72  Pulse: 102  Temp: 97.8 F (36.6 C)  Resp:     General: alert Cardiovascular: s1,s2 rrr Respiratory: CTA BL  Discharge Instructions  Discharge Orders   Future Orders Complete By Expires   Diet - low sodium heart healthy  As directed    Discharge instructions  As directed    Comments:     Please follow up with primary  care doctor in 1 week   Increase activity slowly  As directed        Medication List         albuterol 108 (90 BASE) MCG/ACT inhaler  Commonly known as:  PROVENTIL HFA;VENTOLIN HFA  Inhale 2 puffs into the lungs every 4 (four) hours as needed. For shortness of breath     albuterol (2.5 MG/3ML) 0.083% nebulizer solution  Commonly known as:  PROVENTIL  Take 2.5 mg by nebulization every 6 (six) hours as needed. For shortness of breath     ALPRAZolam 1 MG tablet  Commonly known as:  XANAX  Take 1 mg by mouth 3 (three) times daily as needed for anxiety.     atenolol-chlorthalidone 100-25 MG per tablet  Commonly known as:  TENORETIC  Take 0.5 tablets by mouth daily.     budesonide-formoterol 160-4.5 MCG/ACT inhaler  Commonly known as:  SYMBICORT  Inhale 2 puffs into the lungs 2 (two) times daily.     HYDROcodone-acetaminophen 10-325 MG per tablet  Commonly known as:  NORCO  Take 1 tablet by mouth 3 (three) times daily as needed for moderate pain.     levofloxacin 750 MG tablet  Commonly known as:  LEVAQUIN  Take 1 tablet (750 mg total) by mouth daily.     lithium carbonate 300 MG capsule  Take 300 mg by mouth 3 (three) times daily with meals.     omeprazole 20 MG capsule  Commonly known as:  PRILOSEC  Take 20 mg by mouth daily as needed (for acid reflux/indigestion).  predniSONE 10 MG tablet  Commonly known as:  DELTASONE  Take 1 tablet (10 mg total) by mouth daily with breakfast.       Allergies  Allergen Reactions  . Codeine Nausea And Vomiting       Follow-up Information   Follow up with SHAW,EVA, MD. Schedule an appointment as soon as possible for a visit in 1 week.   Specialty:  Family Medicine   Contact information:   [address] [place] [id number] [phone]        The results of significant diagnostics from this hospitalization (including imaging, microbiology, ancillary and laboratory) are listed below for reference.    Significant  Diagnostic Studies: Dg Chest Portable 1 View  10/31/2013   CLINICAL DATA:  Shortness of breath.  EXAM: PORTABLE CHEST - 1 VIEW  COMPARISON:  05/22/2012.  FINDINGS: The cardiac silhouette, mediastinal and hilar contours are within normal limits and stable. The lungs are clear. Mild hyperinflation and probable emphysematous changes. No pleural effusion or pneumothorax. The bony thorax is intact.  IMPRESSION: No acute cardiopulmonary findings.   Electronically Signed   By: Loralie ChampagneMark  Gallerani M.D.   On: 10/31/2013 17:22    Microbiology: No results found for this or any previous visit (from the past 240 hour(s)).   Labs: Basic Metabolic Panel:  Recent Labs Lab 10/31/13 1645 11/01/13 0556  NA 139 139  K 4.7 4.1  CL 96 97  CO2 29 27  GLUCOSE 110* 220*  BUN 19 20  CREATININE 1.19 1.00  CALCIUM 9.3 9.1   Liver Function Tests:  Recent Labs Lab 11/01/13 0556  AST 18  ALT 32  ALKPHOS 73  BILITOT 0.7  PROT 7.2  ALBUMIN 3.4*   No results found for this basename: LIPASE, AMYLASE,  in the last 168 hours No results found for this basename: AMMONIA,  in the last 168 hours CBC:  Recent Labs Lab 10/31/13 1645 11/01/13 0556  WBC 15.5* 10.2  NEUTROABS  --  9.3*  HGB 18.1* 17.0  HCT 52.3* 48.6  MCV 94.1 94.0  PLT 132* 124*   Cardiac Enzymes: No results found for this basename: CKTOTAL, CKMB, CKMBINDEX, TROPONINI,  in the last 168 hours BNP: BNP (last 3 results)  Recent Labs  10/31/13 1645  PROBNP 125.2*   CBG: No results found for this basename: GLUCAP,  in the last 168 hours     Signed:  Jonette MateBURIEV, Biridiana Twardowski N  [facility] 11/01/2013, 11:48 AM

## 2013-11-01 NOTE — Progress Notes (Signed)
Went over all d/c info with pt and wife.

## 2013-11-01 NOTE — Progress Notes (Addendum)
Pt discharged per w/c accompanied by Nurse tech and wife. Pt has d/c info when next meds are due, scripts and follow up appts. Also 02 tank from [facility] on 4 l n/c

## 2013-11-01 NOTE — Progress Notes (Signed)
[facility] here to deliver tank for pt and discuss proper use.

## 2013-11-01 NOTE — Progress Notes (Signed)
Call to [facility] for oxygen tank and instruction see previous note for sat results on RA and while ambulating.

## 2013-11-01 NOTE — Progress Notes (Signed)
SATURATION QUALIFICATIONS: (This note is used to comply with regulatory documentation for home oxygen)  Patient Saturations on Room Air at Rest =84 %  Patient Saturations on Room Air while Ambulating = 82  Patient Saturations on 4 Liters of oxygen while Ambulating = 91 %  Please briefly explain why patient needs home oxygen: Emphysema per CXR and desaturation  on RA

## 2013-11-01 NOTE — Progress Notes (Signed)
Pt. Arrived to the floor via stretcher in alert and stable condition. Pt. Oriented to room and call system. Call light within reach. Wife at bedside. Pt. O2 saturation 91% (40% venturi mask). No distress or discomfort noted. Pt. Stated he has constant back pain 5/10 in lower back that he had received pain medication for before arriving to unit. Pain is tolerable at this time. RN will continue to monitor pt. For changes in condition. Anai Lipson, Cheryll DessertKaren Cherrell

## 2014-03-06 ENCOUNTER — Emergency Department (HOSPITAL_COMMUNITY)
Admission: EM | Admit: 2014-03-06 | Discharge: 2014-03-06 | Disposition: A | Discharge status: Home / Self Care | Payer: Medicare HMO | Attending: Emergency Medicine | Admitting: Emergency Medicine

## 2014-03-06 ENCOUNTER — Emergency Department (HOSPITAL_COMMUNITY): Payer: Medicare HMO

## 2014-03-06 ENCOUNTER — Encounter (HOSPITAL_COMMUNITY): Payer: Self-pay | Admitting: Emergency Medicine

## 2014-03-06 DIAGNOSIS — M25569 Pain in unspecified knee: Secondary | ICD-10-CM

## 2014-03-06 DIAGNOSIS — W010XXA Fall on same level from slipping, tripping and stumbling without subsequent striking against object, initial encounter: Secondary | ICD-10-CM | POA: Insufficient documentation

## 2014-03-06 DIAGNOSIS — M129 Arthropathy, unspecified: Secondary | ICD-10-CM | POA: Insufficient documentation

## 2014-03-06 DIAGNOSIS — IMO0002 Reserved for concepts with insufficient information to code with codable children: Secondary | ICD-10-CM | POA: Insufficient documentation

## 2014-03-06 DIAGNOSIS — S79929A Unspecified injury of unspecified thigh, initial encounter: Secondary | ICD-10-CM | POA: Insufficient documentation

## 2014-03-06 DIAGNOSIS — Z79899 Other long term (current) drug therapy: Secondary | ICD-10-CM | POA: Insufficient documentation

## 2014-03-06 DIAGNOSIS — K219 Gastro-esophageal reflux disease without esophagitis: Secondary | ICD-10-CM | POA: Insufficient documentation

## 2014-03-06 DIAGNOSIS — F17200 Nicotine dependence, unspecified, uncomplicated: Secondary | ICD-10-CM | POA: Insufficient documentation

## 2014-03-06 DIAGNOSIS — I10 Essential (primary) hypertension: Secondary | ICD-10-CM | POA: Insufficient documentation

## 2014-03-06 DIAGNOSIS — F319 Bipolar disorder, unspecified: Secondary | ICD-10-CM | POA: Insufficient documentation

## 2014-03-06 DIAGNOSIS — R000 Tachycardia, unspecified: Secondary | ICD-10-CM | POA: Insufficient documentation

## 2014-03-06 DIAGNOSIS — S99919A Unspecified injury of unspecified ankle, initial encounter: Secondary | ICD-10-CM

## 2014-03-06 DIAGNOSIS — S8990XA Unspecified injury of unspecified lower leg, initial encounter: Secondary | ICD-10-CM | POA: Insufficient documentation

## 2014-03-06 DIAGNOSIS — J438 Other emphysema: Secondary | ICD-10-CM | POA: Insufficient documentation

## 2014-03-06 DIAGNOSIS — Y9229 Other specified public building as the place of occurrence of the external cause: Secondary | ICD-10-CM | POA: Insufficient documentation

## 2014-03-06 DIAGNOSIS — S79919A Unspecified injury of unspecified hip, initial encounter: Secondary | ICD-10-CM | POA: Insufficient documentation

## 2014-03-06 DIAGNOSIS — M25559 Pain in unspecified hip: Secondary | ICD-10-CM

## 2014-03-06 DIAGNOSIS — Y939 Activity, unspecified: Secondary | ICD-10-CM | POA: Insufficient documentation

## 2014-03-06 DIAGNOSIS — S99929A Unspecified injury of unspecified foot, initial encounter: Secondary | ICD-10-CM

## 2014-03-06 MED ORDER — OXYCODONE-ACETAMINOPHEN 5-325 MG PO TABS
2.0000 | ORAL_TABLET | Freq: Once | ORAL | Status: AC
  Administered 2014-03-06: 2 via ORAL
  Filled 2014-03-06: qty 2

## 2014-03-06 MED ORDER — OXYCODONE-ACETAMINOPHEN 5-325 MG PO TABS: 1.0000 | ORAL_TABLET | Freq: Three times a day (TID) | ORAL | Status: DC | PRN

## 2014-03-06 NOTE — Discharge Instructions (Signed)
Arthralgia  Your caregiver has diagnosed you as suffering from an arthralgia. Arthralgia means there is pain in a joint. This can come from many reasons including:  · Bruising the joint which causes soreness (inflammation) in the joint.  · Wear and tear on the joints which occur as we grow older (osteoarthritis).  · Overusing the joint.  · Various forms of arthritis.  · Infections of the joint.  Regardless of the cause of pain in your joint, most of these different pains respond to anti-inflammatory drugs and rest. The exception to this is when a joint is infected, and these cases are treated with antibiotics, if it is a bacterial infection.  HOME CARE INSTRUCTIONS   · Rest the injured area for as long as directed by your caregiver. Then slowly start using the joint as directed by your caregiver and as the pain allows. Crutches as directed may be useful if the ankles, knees or hips are involved. If the knee was splinted or casted, continue use and care as directed. If an stretchy or elastic wrapping bandage has been applied today, it should be removed and re-applied every 3 to 4 hours. It should not be applied tightly, but firmly enough to keep swelling down. Watch toes and feet for swelling, bluish discoloration, coldness, numbness or excessive pain. If any of these problems (symptoms) occur, remove the ace bandage and re-apply more loosely. If these symptoms persist, contact your caregiver or return to this location.  · For the first 24 hours, keep the injured extremity elevated on pillows while lying down.  · Apply ice for 15-20 minutes to the sore joint every couple hours while awake for the first half day. Then 03-04 times per day for the first 48 hours. Put the ice in a plastic bag and place a towel between the bag of ice and your skin.  · Wear any splinting, casting, elastic bandage applications, or slings as instructed.  · Only take over-the-counter or prescription medicines for pain, discomfort, or fever as  directed by your caregiver. Do not use aspirin immediately after the injury unless instructed by your physician. Aspirin can cause increased bleeding and bruising of the tissues.  · If you were given crutches, continue to use them as instructed and do not resume weight bearing on the sore joint until instructed.  Persistent pain and inability to use the sore joint as directed for more than 2 to 3 days are warning signs indicating that you should see a caregiver for a follow-up visit as soon as possible. Initially, a hairline fracture (break in bone) may not be evident on X-rays. Persistent pain and swelling indicate that further evaluation, non-weight bearing or use of the joint (use of crutches or slings as instructed), or further X-rays are indicated. X-rays may sometimes not show a small fracture until a week or 10 days later. Make a follow-up appointment with your own caregiver or one to whom we have referred you. A radiologist (specialist in reading X-rays) may read your X-rays. Make sure you know how you are to obtain your X-ray results. Do not assume everything is normal if you do not hear from us.  SEEK MEDICAL CARE IF:  Bruising, swelling, or pain increases.  SEEK IMMEDIATE MEDICAL CARE IF:   · Your fingers or toes are numb or blue.  · The pain is not responding to medications and continues to stay the same or get worse.  · The pain in your joint becomes severe.  · You   develop a fever over 102° F (38.9° C).  · It becomes impossible to move or use the joint.  MAKE SURE YOU:   · Understand these instructions.  · Will watch your condition.  · Will get help right away if you are not doing well or get worse.  Document Released: 10/17/2005 Document Revised: 01/09/2012 Document Reviewed: 06/04/2008  [employer]® Patient Information ©2014 [employer].

## 2014-03-06 NOTE — ED Provider Notes (Signed)
[name]: [id number]     Arrival date & time 03/06/14  1911 History   First MD Initiated Contact with Patient 03/06/14 2002     Chief Complaint  Patient presents with  . Fall  . Hip Pain     (Consider location/radiation/quality/duration/timing/severity/associated sxs/prior Treatment) HPI Comments: Patient presents to the emergency department with chief complaint of left hip, and left knee pain. He states that he slipped and fell yesterday the grocery store. He has tried using Vicodin with no relief. He states the pain is aggravated with ambulation and movement. He denies any alleviating factors. Denies fevers, chills, nausea, or vomiting.  The history is provided by the patient. No language interpreter was used.    Past Medical History  Diagnosis Date  . COPD (chronic obstructive pulmonary disease)   . Hiatal hernia   . Emphysema   . Emphysema   . Bipolar disorder   . Hypertension   . Shortness of breath   . GERD (gastroesophageal reflux disease)   . Headache(784.0)   . Arthritis    Past Surgical History  Procedure Laterality Date  . Artheroscopic knee surgery r Right 01/2013   History reviewed. No pertinent family history. History  Substance Use Topics  . Smoking status: Current Every Day Smoker -- 0.50 packs/day for 43 years    Types: Cigarettes  . Smokeless tobacco: Never Used  . Alcohol Use: No    Review of Systems  Constitutional: Negative for fever and chills.  Respiratory: Negative for shortness of breath.   Cardiovascular: Negative for chest pain.  Gastrointestinal: Negative for nausea, vomiting, diarrhea and constipation.  Genitourinary: Negative for dysuria.  Musculoskeletal: Positive for arthralgias, joint swelling and myalgias.      Allergies  Codeine  Home Medications   Prior to Admission medications   Medication Sig Start Date End Date Taking? Authorizing Provider  albuterol (PROVENTIL HFA;VENTOLIN HFA) 108 (90 BASE) MCG/ACT inhaler Inhale 2 puffs  into the lungs every 4 (four) hours as needed. For shortness of breath   Yes Historical Provider, MD  albuterol (PROVENTIL) (2.5 MG/3ML) 0.083% nebulizer solution Take 2.5 mg by nebulization every 6 (six) hours as needed. For shortness of breath   Yes Historical Provider, MD  ALPRAZolam Prudy Feeler(XANAX) 1 MG tablet Take 1 mg by mouth 3 (three) times daily as needed for anxiety.    Yes Historical Provider, MD  atenolol-chlorthalidone (TENORETIC) 100-25 MG per tablet Take 0.5 tablets by mouth daily.   Yes Historical Provider, MD  budesonide-formoterol (SYMBICORT) 160-4.5 MCG/ACT inhaler Inhale 2 puffs into the lungs 2 (two) times daily.   Yes Historical Provider, MD  HYDROcodone-acetaminophen (NORCO) 10-325 MG per tablet Take 0.5-1 tablets by mouth 3 (three) times daily as needed for moderate pain.    Yes Historical Provider, MD  lithium carbonate 300 MG capsule Take 300 mg by mouth 3 (three) times daily with meals.   Yes Historical Provider, MD  Omega-3 Fatty Acids (FISH OIL) 500 MG CAPS Take 500 mg by mouth daily.   Yes Historical Provider, MD  omeprazole (PRILOSEC) 20 MG capsule Take 20 mg by mouth daily as needed (for acid reflux/indigestion).   Yes Historical Provider, MD   BP 151/74  Pulse 125  Temp(Src) 99 F (37.2 C) (Oral)  Resp 24  SpO2 90% Physical Exam  Nursing note and vitals reviewed. Constitutional: He is oriented to person, place, and time. He appears well-developed and well-nourished.  HENT:  Head: Normocephalic and atraumatic.  Eyes: Conjunctivae and EOM are normal. Pupils are  equal, round, and reactive to light. Right eye exhibits no discharge. Left eye exhibits no discharge. No scleral icterus.  Neck: Normal range of motion. Neck supple. No JVD present.  Cardiovascular: Regular rhythm and normal heart sounds.  Exam reveals no gallop and no friction rub.   No murmur heard. Tachycardic  Pulmonary/Chest: Effort normal and breath sounds normal. No respiratory distress. He has no  wheezes. He has no rales. He exhibits no tenderness.  Abdominal: Soft. He exhibits no distension and no mass. There is no tenderness. There is no rebound and no guarding.  Musculoskeletal: Normal range of motion. He exhibits no edema and no tenderness.  Range of motion and strength reduced secondary to pain, some bony tenderness over the lateral hip, no bony tenderness over the lateral knee.  Neurological: He is alert and oriented to person, place, and time.  Skin: Skin is warm and dry.  Psychiatric: He has a normal mood and affect. His behavior is normal. Judgment and thought content normal.    ED Course  Procedures (including critical care time) Labs Review Labs Reviewed - No data to display  Imaging Review Dg Hip Complete Left  03/06/2014   CLINICAL DATA:  Larey SeatFell today. Lateral hip pain. Pain worse with weight-bearing and flexing. No prior injury.  EXAM: LEFT HIP - COMPLETE 2+ VIEW  COMPARISON:  None.  FINDINGS: There is no evidence of hip fracture or dislocation. There is no evidence of arthropathy or other focal bone abnormality.  IMPRESSION: Negative.   Electronically Signed   By: Rosalie GumsBeth  Brown M.D.   On: 03/06/2014 21:42   Dg Knee Complete 4 Views Left  03/06/2014   CLINICAL DATA:  Fall  EXAM: LEFT KNEE - COMPLETE 4+ VIEW  COMPARISON:  None.  FINDINGS: There is no evidence of fracture, dislocation, or joint effusion. There is no evidence of arthropathy or other focal bone abnormality. Soft tissues are unremarkable.  IMPRESSION: Negative.   Electronically Signed   By: Elige KoHetal  Patel   On: 03/06/2014 21:45     EKG Interpretation None      MDM   Final diagnoses:  Hip pain  Knee pain    Patient with knee pain and hip pain following a fall.  Negative plain films.  DC to home with ortho follow-up.  Patient is stable and ready for discharge.  I personally performed the services described in this documentation, which was scribed in my presence. The recorded information has been reviewed  and is accurate.  Filed Vitals:   03/06/14 2237  BP:   Pulse: 88  Temp:   Resp:       Roxy Horsemanobert Dalaina Tates, PA-C 03/06/14 2259

## 2014-03-06 NOTE — ED Notes (Signed)
Pt states he slipped then fell catching himself before his hit the ground yesterday [place] [employer]. Pt states that his left hip and left knee are hurting him. Pt states he tried OTC medication with no relief. Pt ambulatory.

## 2014-03-10 NOTE — ED Provider Notes (Signed)
Medical screening examination/treatment/procedure(s) were performed by non-physician practitioner and as supervising physician I was immediately available for consultation/collaboration.   EKG Interpretation None        Shelda JakesScott W. Malayasia Mirkin, MD 03/10/14 [phone]

## 2014-03-21 ENCOUNTER — Institutional Professional Consult (permissible substitution): Payer: Medicare Other | Admitting: Emergency Medicine

## 2014-03-27 ENCOUNTER — Institutional Professional Consult (permissible substitution): Payer: Medicare Other | Admitting: Emergency Medicine

## 2014-04-02 ENCOUNTER — Telehealth: Payer: Self-pay

## 2014-04-02 NOTE — Telephone Encounter (Signed)
Glen Lopez from home health care called because she received a fax from [facility] for pt's oxygen form, that only had a circle on the page, and nothing else. Would like to know if she can refax the paper work

## 2014-04-02 NOTE — Telephone Encounter (Signed)
Spoke to [place]  And she will refax the ppw for Dr. Clelia Croft to complete.

## 2014-06-25 ENCOUNTER — Emergency Department (HOSPITAL_COMMUNITY): Payer: Medicare HMO

## 2014-06-25 ENCOUNTER — Emergency Department (HOSPITAL_COMMUNITY)
Admission: EM | Admit: 2014-06-25 | Discharge: 2014-06-25 | Disposition: A | Discharge status: Home / Self Care | Payer: Medicare HMO | Attending: Emergency Medicine | Admitting: Emergency Medicine

## 2014-06-25 ENCOUNTER — Encounter (HOSPITAL_COMMUNITY): Payer: Self-pay | Admitting: Emergency Medicine

## 2014-06-25 DIAGNOSIS — Z79899 Other long term (current) drug therapy: Secondary | ICD-10-CM | POA: Insufficient documentation

## 2014-06-25 DIAGNOSIS — M129 Arthropathy, unspecified: Secondary | ICD-10-CM | POA: Insufficient documentation

## 2014-06-25 DIAGNOSIS — R000 Tachycardia, unspecified: Secondary | ICD-10-CM | POA: Insufficient documentation

## 2014-06-25 DIAGNOSIS — S8990XA Unspecified injury of unspecified lower leg, initial encounter: Secondary | ICD-10-CM | POA: Diagnosis not present

## 2014-06-25 DIAGNOSIS — X500XXA Overexertion from strenuous movement or load, initial encounter: Secondary | ICD-10-CM | POA: Diagnosis not present

## 2014-06-25 DIAGNOSIS — Y9389 Activity, other specified: Secondary | ICD-10-CM | POA: Insufficient documentation

## 2014-06-25 DIAGNOSIS — S99919A Unspecified injury of unspecified ankle, initial encounter: Principal | ICD-10-CM

## 2014-06-25 DIAGNOSIS — I10 Essential (primary) hypertension: Secondary | ICD-10-CM | POA: Insufficient documentation

## 2014-06-25 DIAGNOSIS — K219 Gastro-esophageal reflux disease without esophagitis: Secondary | ICD-10-CM | POA: Diagnosis not present

## 2014-06-25 DIAGNOSIS — Z9889 Other specified postprocedural states: Secondary | ICD-10-CM | POA: Diagnosis not present

## 2014-06-25 DIAGNOSIS — F319 Bipolar disorder, unspecified: Secondary | ICD-10-CM | POA: Diagnosis not present

## 2014-06-25 DIAGNOSIS — Y9289 Other specified places as the place of occurrence of the external cause: Secondary | ICD-10-CM | POA: Insufficient documentation

## 2014-06-25 DIAGNOSIS — F17200 Nicotine dependence, unspecified, uncomplicated: Secondary | ICD-10-CM | POA: Insufficient documentation

## 2014-06-25 DIAGNOSIS — J438 Other emphysema: Secondary | ICD-10-CM | POA: Diagnosis not present

## 2014-06-25 DIAGNOSIS — M25561 Pain in right knee: Secondary | ICD-10-CM

## 2014-06-25 DIAGNOSIS — S99929A Unspecified injury of unspecified foot, initial encounter: Principal | ICD-10-CM

## 2014-06-25 DIAGNOSIS — G8929 Other chronic pain: Secondary | ICD-10-CM

## 2014-06-25 MED ORDER — OXYCODONE-ACETAMINOPHEN 5-325 MG PO TABS
2.0000 | ORAL_TABLET | Freq: Once | ORAL | Status: AC
  Administered 2014-06-25: 2 via ORAL
  Filled 2014-06-25: qty 2

## 2014-06-25 MED ORDER — OXYCODONE-ACETAMINOPHEN 5-325 MG PO TABS: 1.0000 | ORAL_TABLET | Freq: Four times a day (QID) | ORAL | Status: DC | PRN

## 2014-06-25 NOTE — Discharge Instructions (Signed)

## 2014-06-25 NOTE — ED Notes (Addendum)
PT reports leg pain; tachy at 126. Reports radiates to groin on right side. Pt is not a diabetic. Hx of multiple leg injuries and surgeries in the past. Denies chest pain; SOB.

## 2014-06-25 NOTE — ED Notes (Addendum)
States "I don't want to leave without any pain medicine. I just can't take this,. The percocet is not doing anything"

## 2014-06-25 NOTE — ED Notes (Signed)
Pt no longer diaphoretic, heart rate has come down to 100-110. Pt states he is trying to get into a pain clinic, and has been on percocet for a long time for chronic back pain. Grandson at bedside.

## 2014-06-25 NOTE — ED Notes (Signed)
Pt diaphoretic, jittery, tachycardic-- states "I haven't taken my medicine this am"

## 2014-06-25 NOTE — ED Notes (Signed)
PT takes atenolol and did not take it this morning.

## 2014-06-25 NOTE — ED Provider Notes (Signed)
[name]: [id number]     Arrival date & time 06/25/14  1049 History   First MD Initiated Contact with Patient 06/25/14 1101     Chief Complaint  Patient presents with  . Leg Pain     (Consider location/radiation/quality/duration/timing/severity/associated sxs/prior Treatment) HPI Comments: Patient presents to the emergency department with chief complaint of right knee pain. He states that he recently twisted his knee "wrong" while getting out of car. He states that he has chronic pain in his right knee. He has been followed by orthopedic surgery. He states his pain is 10 out of 10. It radiates from his knee to his groin. He denies any swelling. He states that he normally takes Percocet for his pain. It is aggravated with movement and palpation. He denies fevers, chills, shortness of breath, or chest pain.  The history is provided by the patient. No language interpreter was used.    Past Medical History  Diagnosis Date  . COPD (chronic obstructive pulmonary disease)   . Hiatal hernia   . Emphysema   . Emphysema   . Bipolar disorder   . Hypertension   . Shortness of breath   . GERD (gastroesophageal reflux disease)   . Headache(784.0)   . Arthritis    Past Surgical History  Procedure Laterality Date  . Artheroscopic knee surgery r Right 01/2013   History reviewed. No pertinent family history. History  Substance Use Topics  . Smoking status: Current Every Day Smoker -- 0.50 packs/day for 43 years    Types: Cigarettes  . Smokeless tobacco: Never Used  . Alcohol Use: No    Review of Systems  Constitutional: Negative for fever and chills.  Respiratory: Negative for shortness of breath.   Cardiovascular: Negative for chest pain.  Gastrointestinal: Negative for nausea, vomiting, diarrhea and constipation.  Genitourinary: Negative for dysuria.  Musculoskeletal: Positive for arthralgias and joint swelling.      Allergies  Codeine  Home Medications   Prior to Admission  medications   Medication Sig Start Date End Date Taking? Authorizing Provider  albuterol (PROVENTIL HFA;VENTOLIN HFA) 108 (90 BASE) MCG/ACT inhaler Inhale 2 puffs into the lungs every 4 (four) hours as needed. For shortness of breath    Historical Provider, MD  albuterol (PROVENTIL) (2.5 MG/3ML) 0.083% nebulizer solution Take 2.5 mg by nebulization every 6 (six) hours as needed. For shortness of breath    Historical Provider, MD  ALPRAZolam Prudy Feeler) 1 MG tablet Take 1 mg by mouth 3 (three) times daily as needed for anxiety.     Historical Provider, MD  atenolol-chlorthalidone (TENORETIC) 100-25 MG per tablet Take 0.5 tablets by mouth daily.    Historical Provider, MD  budesonide-formoterol (SYMBICORT) 160-4.5 MCG/ACT inhaler Inhale 2 puffs into the lungs 2 (two) times daily.    Historical Provider, MD  HYDROcodone-acetaminophen (NORCO) 10-325 MG per tablet Take 0.5-1 tablets by mouth 3 (three) times daily as needed for moderate pain.     Historical Provider, MD  lithium carbonate 300 MG capsule Take 300 mg by mouth 3 (three) times daily with meals.    Historical Provider, MD  Omega-3 Fatty Acids (FISH OIL) 500 MG CAPS Take 500 mg by mouth daily.    Historical Provider, MD  omeprazole (PRILOSEC) 20 MG capsule Take 20 mg by mouth daily as needed (for acid reflux/indigestion).    Historical Provider, MD  oxyCODONE-acetaminophen (PERCOCET/ROXICET) 5-325 MG per tablet Take 1-2 tablets by mouth every 8 (eight) hours as needed for severe pain. 03/06/14   [name]  [name], PA-C   BP 131/95  Pulse 127  Temp(Src) 97.8 F (36.6 C) (Oral)  Resp 20  SpO2 93% Physical Exam  Nursing note and vitals reviewed. Constitutional: He is oriented to person, place, and time. He appears well-developed and well-nourished.  HENT:  Head: Normocephalic and atraumatic.  Eyes: Conjunctivae and EOM are normal.  Neck: Normal range of motion.  Cardiovascular:  Tachycardia  Right PT pulse is found with doppler  Right DP  pulse is not found  Left PT/DP pulses intact to palpation  Pulmonary/Chest: Breath sounds normal. He is in respiratory distress.  Abdominal: He exhibits no distension.  Musculoskeletal: Normal range of motion.  Right knee mildly tender to palpation, no obvious swelling, no evidence of DVT, or septic joint, range of motion and strength mildly limited secondary to pain  Neurological: He is alert and oriented to person, place, and time.  Skin: Skin is dry.  Psychiatric: He has a normal mood and affect. His behavior is normal. Judgment and thought content normal.    ED Course  Procedures (including critical care time) Labs Review Labs Reviewed - No data to display  Imaging Review No results found.   EKG Interpretation None      MDM   Final diagnoses:  Chronic knee pain, right    Patient with knee pain that radiates to the groin.  This is not a new problem.  Patient states that he is out of his pain medications.  Is trying to establish care with orthopedics.  Patient repeatedly asks for pain medicines.  States that he is not leaving without any.  Suspect that the patient is drug seeking and is likely in opiate withdrawal.    Patient seen by and discussed with Dr. Loretha Stapler, who agrees that no additional workup is needed today.  DC to home with ortho follow-up. I will give him a few percocet, but have told him this will be the last he will receive from [place] without seeing ortho.   1:57 PM  Nurse reports that after discharge, patient 2 other family members join him with prescription pain medications, all seen today.   Roxy Horseman, PA-C 06/25/14 1357

## 2014-06-26 NOTE — ED Provider Notes (Signed)
Medical screening examination/treatment/procedure(s) were conducted as a shared visit with non-physician practitioner(s) and myself.  I personally evaluated the patient during the encounter.   EKG Interpretation None      58 yo male with chronic right knee pain.  He denies any new component to his pain, stating it is severe, sharp, radiating up to his groin.  On exam, well appearing, nontoxic, not distressed, normal respiratory effort, normal perfusion, right knee with mild TTP of lateral aspect without instability or gross deformity, no testicular mass or tenderness, able to bear weight.  Plan dc with outpatient treatment.   Clinical Impression: 1. Chronic knee pain, right       Candyce Churn III, MD 06/26/14 [phone]

## 2014-06-26 NOTE — ED Provider Notes (Signed)
Medical screening examination/treatment/procedure(s) were conducted as a shared visit with non-physician practitioner(s) and myself.  I personally evaluated the patient during the encounter.   EKG Interpretation None        Candyce Churn III, MD 06/26/14 [phone]

## 2014-06-27 ENCOUNTER — Emergency Department (HOSPITAL_COMMUNITY)
Admission: EM | Admit: 2014-06-27 | Discharge: 2014-06-27 | Disposition: A | Discharge status: Home / Self Care | Payer: Medicare HMO | Attending: Emergency Medicine | Admitting: Emergency Medicine

## 2014-06-27 ENCOUNTER — Encounter (HOSPITAL_COMMUNITY): Payer: Self-pay | Admitting: Emergency Medicine

## 2014-06-27 DIAGNOSIS — F319 Bipolar disorder, unspecified: Secondary | ICD-10-CM | POA: Diagnosis not present

## 2014-06-27 DIAGNOSIS — J4489 Other specified chronic obstructive pulmonary disease: Secondary | ICD-10-CM | POA: Insufficient documentation

## 2014-06-27 DIAGNOSIS — Z79899 Other long term (current) drug therapy: Secondary | ICD-10-CM | POA: Diagnosis not present

## 2014-06-27 DIAGNOSIS — F19239 Other psychoactive substance dependence with withdrawal, unspecified: Secondary | ICD-10-CM | POA: Insufficient documentation

## 2014-06-27 DIAGNOSIS — G8929 Other chronic pain: Secondary | ICD-10-CM | POA: Insufficient documentation

## 2014-06-27 DIAGNOSIS — F17200 Nicotine dependence, unspecified, uncomplicated: Secondary | ICD-10-CM | POA: Insufficient documentation

## 2014-06-27 DIAGNOSIS — F1123 Opioid dependence with withdrawal: Secondary | ICD-10-CM

## 2014-06-27 DIAGNOSIS — J438 Other emphysema: Secondary | ICD-10-CM | POA: Insufficient documentation

## 2014-06-27 DIAGNOSIS — F19939 Other psychoactive substance use, unspecified with withdrawal, unspecified: Secondary | ICD-10-CM | POA: Insufficient documentation

## 2014-06-27 DIAGNOSIS — J449 Chronic obstructive pulmonary disease, unspecified: Secondary | ICD-10-CM | POA: Insufficient documentation

## 2014-06-27 DIAGNOSIS — F1120 Opioid dependence, uncomplicated: Secondary | ICD-10-CM | POA: Diagnosis not present

## 2014-06-27 DIAGNOSIS — M25569 Pain in unspecified knee: Secondary | ICD-10-CM | POA: Insufficient documentation

## 2014-06-27 DIAGNOSIS — I10 Essential (primary) hypertension: Secondary | ICD-10-CM | POA: Insufficient documentation

## 2014-06-27 DIAGNOSIS — Z8739 Personal history of other diseases of the musculoskeletal system and connective tissue: Secondary | ICD-10-CM | POA: Diagnosis not present

## 2014-06-27 DIAGNOSIS — K219 Gastro-esophageal reflux disease without esophagitis: Secondary | ICD-10-CM | POA: Insufficient documentation

## 2014-06-27 MED ORDER — LORAZEPAM 1 MG PO TABS: 0.5000 mg | ORAL_TABLET | Freq: Four times a day (QID) | ORAL | Status: DC | PRN

## 2014-06-27 MED ORDER — LORAZEPAM 1 MG PO TABS
1.0000 mg | ORAL_TABLET | Freq: Once | ORAL | Status: AC
  Administered 2014-06-27: 1 mg via ORAL
  Filled 2014-06-27: qty 1

## 2014-06-27 MED ORDER — CLONIDINE HCL 0.1 MG PO TABS: 0.1000 mg | ORAL_TABLET | Freq: Three times a day (TID) | ORAL | Status: DC

## 2014-06-27 MED ORDER — CLONIDINE HCL 0.2 MG PO TABS
0.2000 mg | ORAL_TABLET | Freq: Once | ORAL | Status: AC
  Administered 2014-06-27: 0.2 mg via ORAL
  Filled 2014-06-27: qty 1

## 2014-06-27 MED ORDER — ONDANSETRON HCL 4 MG PO TABS: 4.0000 mg | ORAL_TABLET | Freq: Four times a day (QID) | ORAL | Status: DC | PRN

## 2014-06-27 MED ORDER — ONDANSETRON 4 MG PO TBDP
4.0000 mg | ORAL_TABLET | Freq: Once | ORAL | Status: AC
  Administered 2014-06-27: 4 mg via ORAL
  Filled 2014-06-27: qty 1

## 2014-06-27 NOTE — ED Notes (Signed)
Per pt sts he wants detox from oxycodone. sts he takes 3 a day and last one was 5 hours ago. sts he wither wants to detox or more pain meds.

## 2014-06-27 NOTE — Discharge Instructions (Signed)
Chemical Dependency Chemical dependency is an addiction to drugs or alcohol. It is characterized by the repeated behavior of seeking out and using drugs and alcohol despite harmful consequences to the health and safety of ones self and others.  RISK FACTORS There are certain situations or behaviors that increase a person's risk for chemical dependency. These include:  A family history of chemical dependency.  A history of mental health issues, including depression and anxiety.  A home environment where drugs and alcohol are easily available to you.  Drug or alcohol use at a young age. SYMPTOMS  The following symptoms can indicate chemical dependency:  Inability to limit the use of drugs or alcohol.  Nausea, sweating, shakiness, and anxiety that occurs when alcohol or drugs are not being used.  An increase in amount of drugs or alcohol that is necessary to get drunk or high. People who experience these symptoms can assess their use of drugs and alcohol by asking themselves the following questions:  Have you been told by friends or family that they are worried about your use of alcohol or drugs?  Do friends and family ever tell you about things you did while drinking alcohol or using drugs that you do not remember?  Do you lie about using alcohol or drugs or about the amounts you use?  Do you have difficulty completing daily tasks unless you use alcohol or drugs?  Is the level of your work or school performance lower because of your drug or alcohol use?  Do you get sick from using drugs or alcohol but keep using anyway?  Do you feel uncomfortable in social situations unless you use alcohol or drugs?  Do you use drugs or alcohol to help forget problems? An answer of yes to any of these questions may indicate chemical dependency. Professional evaluation is suggested. Document Released: 10/11/2001 Document Revised: 01/09/2012 Document Reviewed: 12/23/2010 [facility] Patient  Information 2015 [place], [place]. This information is not intended to replace advice given to you by your health care provider. Make sure you discuss any questions you have with your health care provider.  Finding Treatment for Alcohol and Drug Addiction It can be hard to find the right place to get professional treatment. Here are some important things to consider:  There are different types of treatment to choose from.  Some programs are live-in (residential) while others are not (outpatient). Sometimes a combination is offered.  No single type of program is right for everyone.  Most treatment programs involve a combination of education, counseling, and a 12-step, spiritually-based approach.  There are non-spiritually based programs (not 12-step).  Some treatment programs are government sponsored. They are geared for patients without private insurance.  Treatment programs can vary in many respects such as:  Cost and types of insurance accepted.  Types of on-site medical services offered.  Length of stay, setting, and size.  Overall philosophy of treatment. A person may need specialized treatment or have needs not addressed by all programs. For example, adolescents need treatment appropriate for their age. Other people have secondary disorders that must be managed as well. Secondary conditions can include mental illness, such as depression or diabetes. Often, a period of detoxification from alcohol or drugs is needed. This requires medical supervision and not all programs offer this. THINGS TO CONSIDER WHEN SELECTING A TREATMENT PROGRAM   Is the program certified by the appropriate government agency? Even private programs must be certified and employ certified professionals.  Does the program accept your insurance?  If not, can a payment plan be set up?  Is the facility clean, organized, and well run? Do they allow you to speak with graduates who can share their treatment experience  with you? Can you tour the facility? Can you meet with staff?  Does the program meet the full range of individual needs?  Does the treatment program address sexual orientation and physical disabilities? Do they provide age, gender, and culturally appropriate treatment services?  Is treatment available in languages other than English?  Is long-term aftercare support or guidance encouraged and provided?  Is assessment of an individual's treatment plan ongoing to ensure it meets changing needs?  Does the program use strategies to encourage reluctant patients to remain in treatment long enough to increase the likelihood of success?  Does the program offer counseling (individual or group) and other behavioral therapies?  Does the program offer medicine as part of the treatment regimen, if needed?  Is there ongoing monitoring of possible relapse? Is there a defined relapse prevention program? Are services or referrals offered to family members to ensure they understand addiction and the recovery process? This would help them support the recovering individual.  Are 12-step meetings held at the center or is transport available for patients to attend outside meetings? In countries outside of the [place]. and [place], Magazine features editor for contact information for services in your area. Document Released: 09/15/2005 Document Revised: 01/09/2012 Document Reviewed: 03/27/2008 [facility] Patient Information 2015 [place], [place]. This information is not intended to replace advice given to you by your health care provider. Make sure you discuss any questions you have with your health care provider.   Emergency Department Resource Guide 1) Find a Doctor and Pay Out of Pocket Although you won't have to find out who is covered by your insurance plan, it is a good idea to ask around and get recommendations. You will then need to call the office and see if the doctor you have chosen will accept you as a new  patient and what types of options they offer for patients who are self-pay. Some doctors offer discounts or will set up payment plans for their patients who do not have insurance, but you will need to ask so you aren't surprised when you get to your appointment.  2) Contact Your Local Health Department Not all health departments have doctors that can see patients for sick visits, but many do, so it is worth a call to see if yours does. If you don't know where your local health department is, you can check in your phone book. The CDC also has a tool to help you locate your state's health department, and many state websites also have listings of all of their local health departments.  3) Find a Walk-in Clinic If your illness is not likely to be very severe or complicated, you may want to try a walk in clinic. These are popping up all over the country in pharmacies, drugstores, and shopping centers. They're usually staffed by nurse practitioners or physician assistants that have been trained to treat common illnesses and complaints. They're usually fairly quick and inexpensive. However, if you have serious medical issues or chronic medical problems, these are probably not your best option.  No Primary Care Doctor: - Call Health Connect at  [phone] - they can help you locate a primary care doctor that  accepts your insurance, provides certain services, etc. - Physician Referral Service- [phone]  Chronic Pain Problems: Organization  Address  Phone   Notes  [facility]  [phone] Patients need to be referred by their primary care doctor.   Medication Assistance: Organization         Address  Phone   Notes  [facility] Medication [facility] [address] [place], [place] [address] [phone] --Must be a resident of [facility] -- Must have NO insurance coverage whatsoever (no Medicaid/ Medicare, etc.) -- The pt. MUST have a primary  care doctor that directs their care regularly and follows them in the community   MedAssist  [phone]   [employer]  [phone]    Agencies that provide inexpensive medical care: Organization         Address  Phone   Notes  [place]  [phone]   [facility]    [phone]   [facility] [place], [place] [address] [phone]   [place] [address] [place] [phone]   Planned Parenthood    [phone]   [facility]    [phone]   [place] and [place] [address], [place] Phone:  [phone], Fax:  [phone] Hours of Operation:  9 am - 6 pm, M-F.  Also accepts Medicaid/Medicare and self-pay.  [facility] for [place], [address], [place] Phone: [phone], Fax: [phone]. Hours of Operation:  8:30 am - 5:30 pm, M-F.  Also accepts Medicaid and self-pay.  [facility] [place] [address] [place] Phone: [phone]   [place], [place], [place] [phone], Ext. [phone] Mondays & Thursdays: 7-9 AM.  First 15 patients are seen on a first come, first serve basis.    [place] Providers:  Organization         Address  Phone   Notes  [facility] [address], [place] [phone] Also accepts self-pay patients.  [facility] [address], [place]  [phone]   [place], [address], [place] [phone]   [facility] [address] [place] [phone]   Lucianne Lei [address], [place]   [phone] Only accepts [name] Access [place] patients after they have their name applied to their card.   Self-Pay (no insurance) in [facility]:  Organization         Address  Phone   Notes  Sickle Cell Patients, [facility] [place] [phone]   [facility] [place] [phone]   [facility] [place]  [place], [place], [place] [phone]   [facility]/Dr. Osei-Bonsu  [address] [place] or [address], [place] [phone] Phone number for both [place] and [place] locations is the same.  Urgent Medical and [facility] [place] [address], [place] [phone]   [facility] [address] [place] or [address] [phone]   [facility] [address] [place] [phone], phone; [phone], fax Sees patients 1st and 3rd Saturday of every month.  Must not qualify  for public or private insurance (i.e. Medicaid, Medicare, [place] Health Choice, Veterans' Benefits)  Household income should be no more than 200% of the poverty level The clinic cannot treat you if you are pregnant or think you are pregnant  Sexually transmitted diseases are not treated at the clinic.    Dental Care: Organization         Address  Phone  Notes  [facility] Department of [facility] [phone] Accepts children up to age 28 who are enrolled in [place] or [place]; pregnant women with a Medicaid card; and children who have applied for Medicaid or [place] Health Choice, but were declined, whose parents can pay a reduced fee at time of service.  [facility] Department of [facility]  [address], [place] [phone] Accepts children up to age 68 who are enrolled in [place] or [place]; pregnant women with a Medicaid card; and children who have applied for Medicaid or  Health Choice, but were declined, whose parents can  pay a reduced fee at time of service.  [facility]  [place] [phone] Patients are seen by appointment only. Walk-ins are not accepted. [place] will see patients 52 years of age and older. Monday - Tuesday (8am-5pm) Most Wednesdays (8:30-5pm) $30 per visit, cash only  [facility] Adult Dental Access PROGRAM  [address], [facility] [phone] Patients are seen by appointment only. Walk-ins are not accepted. [place] will see patients 36 years of age and older. One Wednesday Evening (Monthly: Volunteer Based).  $30 per visit, cash only  [place]  [phone] for adults; Children under age 40, call Graduate Pediatric Dentistry at [phone]. Children aged 22-14, please call [phone] to request a pediatric application.  Dental services are provided in all areas of dental care including fillings, crowns and bridges, complete and partial dentures, implants, gum treatment, root canals, and extractions. Preventive care is also provided. Treatment is provided to both adults and children. Patients are selected via a lottery and there is often a waiting list.   [facility] [address] [place]  [phone] [email or web address]   [facility] [address] [place], [place] [phone], Ext. [phone] Second and Fourth Thursday of each month, opens at 6:30 AM; Clinic ends at 9 AM.  Patients are seen on a first-come first-served basis, and a limited number are seen during each clinic.   [facility]  [address] Hillard Danker [place], [place] [phone]   Eligibility Requirements You must have lived in [place], [place], or [place] counties for at least the last three months.   You cannot be eligible for state or federal sponsored [employer], including [employer], [place], or [employer].   You generally cannot be eligible for healthcare  insurance through your employer.    How to apply: Eligibility screenings are held every Tuesday and Wednesday afternoon from 1:00 pm until 4:00 pm. You do not need an appointment for the interview!  [facility] [address] [place], [place]   [place]  [place] Department  [place]  [phone]    Behavioral Health Resources in the Community: Intensive Outpatient Programs Organization         Address  Phone  Notes  °[facility] [address], [place], [place] [phone]   °[facility] [address], [place], [place] [phone]   °[facility] [address], [place], [place] ° [phone]   °[facility] [address],  °[place], [place] 1-800-853-5163 or 336-641-4981   °Substance Abuse Resources °Organization         Address  Phone  Notes  °[facility]  [phone]   °[facility]  [phone]   °[facility]  336-285-9073   °[facility]  [phone]   °Residential & Outpatient Substance Abuse Program  1-800-659-3381   °Psychological Services °Organization         Address  Phone  Notes  °[facility]  336- [phone]   °[facility]  336- [phone]   °[facility] [address], [place] 1-800-853-5163 or 336-641-4981   ° °Mobile Crisis Teams °Organization         Address  Phone  Notes  °[facility], Mobile Crisis Care Unit  1-877-626-1772   °Assertive °[facility] Services ° [address] [place], [place] 336-834-9664   °Sharon DeEsch [address] °[place] [phone]   ° °Self-Help/Support Groups °Organization         Address  Phone             Notes  °[facility] [place] - variety of support groups  336- 373-1402 Call for more information  °Narcotics Anonymous (NA),  [facility] [address], °[place]  2 meetings at this location  ° °Residential Treatment Programs °Organization         Address  Phone  Notes  °[facility] [address],    °[place]  1-866-801-8205   °[facility] ° [address], [place], [place] [phone]   °[facility] [address], [place] [phone] Admissions: 8am-3pm M-F  °[facility] [address]    °[place], [place] 336-841-1104   °[facility] [address], [place], [place] 336-379-7146   °[facility] [address]  °[place], [place] 336-285-9073   °[facility] - Intensive Outpatient [address], [place], [place] 336-852-3033   °[facility] ([facility] [address]  °[place], [place] 1-877-615-2722 or 336-784-9470   °[facility] ([facility]) [address] [place], [place] 336-227-7417 Accepts Medicaid  °[facility] [address]  ° [place] 1-800-659-3381 Substance Abuse/Addiction Treatment  ° °[place] Behavioral Health Resources °Organization         Address  Phone  Notes  °[facility]  (888) 581-9988   °Julie Brannon, PhD [address] [place], [place]   (336) 349-5553 or (336) 951-0000   ° Behavioral   [address] °[place], [place] (336) 349-4454   °[facility] [address], [place], [place] (336) 342-8316 Insurance/Medicaid/sponsorship through [facility]  °[facility] [address]                                    [place],  (336) [phone] Therapy/tele-psych/case  °[facility] [address]  ° ,  (336) 349-2233    °Dr. Arfeen  (336) 349-4544   °[facility] [place]  [employer] [place]   [facility]. 1) [address], [place] 2) [address] [place] 3)  [address], [place] [phone]  [phone]    [facility] Child Abuse Hotline 417-730-1755 or (918) [phone] (After Hours)

## 2014-06-27 NOTE — ED Notes (Signed)
Dr. Kohut at bedside 

## 2014-06-29 ENCOUNTER — Emergency Department (HOSPITAL_BASED_OUTPATIENT_CLINIC_OR_DEPARTMENT_OTHER)
Admission: EM | Admit: 2014-06-29 | Discharge: 2014-06-29 | Discharge status: Against Medical Advice | Payer: Medicare HMO | Attending: Emergency Medicine | Admitting: Emergency Medicine

## 2014-06-29 ENCOUNTER — Encounter (HOSPITAL_BASED_OUTPATIENT_CLINIC_OR_DEPARTMENT_OTHER): Payer: Self-pay | Admitting: Emergency Medicine

## 2014-06-29 ENCOUNTER — Emergency Department (HOSPITAL_BASED_OUTPATIENT_CLINIC_OR_DEPARTMENT_OTHER): Payer: Medicare HMO

## 2014-06-29 DIAGNOSIS — F319 Bipolar disorder, unspecified: Secondary | ICD-10-CM | POA: Diagnosis not present

## 2014-06-29 DIAGNOSIS — M25569 Pain in unspecified knee: Secondary | ICD-10-CM | POA: Diagnosis not present

## 2014-06-29 DIAGNOSIS — M25561 Pain in right knee: Secondary | ICD-10-CM

## 2014-06-29 DIAGNOSIS — J438 Other emphysema: Secondary | ICD-10-CM | POA: Insufficient documentation

## 2014-06-29 DIAGNOSIS — Z79899 Other long term (current) drug therapy: Secondary | ICD-10-CM | POA: Diagnosis not present

## 2014-06-29 DIAGNOSIS — K219 Gastro-esophageal reflux disease without esophagitis: Secondary | ICD-10-CM | POA: Diagnosis not present

## 2014-06-29 DIAGNOSIS — I10 Essential (primary) hypertension: Secondary | ICD-10-CM | POA: Insufficient documentation

## 2014-06-29 DIAGNOSIS — M129 Arthropathy, unspecified: Secondary | ICD-10-CM | POA: Insufficient documentation

## 2014-06-29 DIAGNOSIS — G8929 Other chronic pain: Secondary | ICD-10-CM | POA: Insufficient documentation

## 2014-06-29 DIAGNOSIS — F17200 Nicotine dependence, unspecified, uncomplicated: Secondary | ICD-10-CM | POA: Diagnosis not present

## 2014-06-29 NOTE — ED Notes (Signed)
Reports pain to right knee. Chronic pain.  No swelling. Recent fall

## 2014-06-29 NOTE — ED Provider Notes (Signed)
Medical screening examination/treatment/procedure(s) were performed by non-physician practitioner and as supervising physician I was immediately available for consultation/collaboration.   EKG Interpretation None        Elwin Mocha, MD 06/29/14 [phone]

## 2014-06-29 NOTE — ED Provider Notes (Signed)
[name]: [id number]     Arrival date & time 06/29/14  1340 History  This chart was scribed for non-physician practitioner Lonia Skinner. Joylene Grapes, working with Rolland Porter, MD, by Yevette Edwards, ED Scribe. This patient was seen in room MHFT1/MHFT1 and the patient's care was started at 4:00 PM.  First MD Initiated Contact with Patient 06/29/14 1512     Chief Complaint  Patient presents with  . Knee Pain    The history is provided by the patient. No language interpreter was used.   HPI Comments: Glen Lopez is a 58 y.o. male, with a h/o arthritis, who presents to the Emergency Department complaining of pain to his right knee which has persisted for approximately five months. He also endorses numbness to his right inferior leg. The pt was treated at the ED two days ago for similar symptoms. However, he states that he has not been provided adequate pain medication. He has an appointment with a pain management clinic on September 25.  Dr. Mayford Knife was his PCP; his new PCP is Dr. Meredith Staggers. He is a current smoker.   Past Medical History  Diagnosis Date  . COPD (chronic obstructive pulmonary disease)   . Hiatal hernia   . Emphysema   . Emphysema   . Bipolar disorder   . Hypertension   . Shortness of breath   . GERD (gastroesophageal reflux disease)   . Headache(784.0)   . Arthritis    Past Surgical History  Procedure Laterality Date  . Artheroscopic knee surgery r Right 01/2013   No family history on file. History  Substance Use Topics  . Smoking status: Current Every Day Smoker -- 0.50 packs/day for 43 years    Types: Cigarettes  . Smokeless tobacco: Never Used  . Alcohol Use: No    Review of Systems  Constitutional: Negative for fever and chills.  Musculoskeletal: Positive for arthralgias.  All other systems reviewed and are negative.   Allergies  Codeine  Home Medications   Prior to Admission medications   Medication Sig Start Date End Date Taking? Authorizing  Provider  albuterol (PROVENTIL HFA;VENTOLIN HFA) 108 (90 BASE) MCG/ACT inhaler Inhale 2 puffs into the lungs every 4 (four) hours as needed. For shortness of breath    Historical Provider, MD  albuterol (PROVENTIL) (2.5 MG/3ML) 0.083% nebulizer solution Take 2.5 mg by nebulization every 6 (six) hours as needed. For shortness of breath    Historical Provider, MD  ALPRAZolam Prudy Feeler) 1 MG tablet Take 1 mg by mouth 3 (three) times daily as needed for anxiety.     Historical Provider, MD  atenolol-chlorthalidone (TENORETIC) 100-25 MG per tablet Take 0.5 tablets by mouth daily.    Historical Provider, MD  budesonide-formoterol (SYMBICORT) 160-4.5 MCG/ACT inhaler Inhale 2 puffs into the lungs 2 (two) times daily.    Historical Provider, MD  cloNIDine (CATAPRES) 0.1 MG tablet Take 1 tablet (0.1 mg total) by mouth 3 (three) times daily. 06/27/14   Raeford Razor, MD  lithium carbonate 300 MG capsule Take 300 mg by mouth 3 (three) times daily with meals.    Historical Provider, MD  LORazepam (ATIVAN) 1 MG tablet Take 0.5 tablets (0.5 mg total) by mouth every 6 (six) hours as needed for anxiety. 06/27/14   Raeford Razor, MD  Omega-3 Fatty Acids (FISH OIL) 500 MG CAPS Take 500 mg by mouth daily.    Historical Provider, MD  omeprazole (PRILOSEC) 20 MG capsule Take 20 mg by mouth daily as needed (for acid  reflux/indigestion).    Historical Provider, MD  ondansetron (ZOFRAN) 4 MG tablet Take 1 tablet (4 mg total) by mouth 4 (four) times daily as needed for nausea or vomiting. 06/27/14   Raeford Razor, MD  oxyCODONE-acetaminophen (PERCOCET/ROXICET) 5-325 MG per tablet Take 2 tablets by mouth 3 (three) times daily as needed for moderate pain or severe pain.     Historical Provider, MD   Triage Vitals: BP 107/72  Pulse 80  Temp(Src) 98.1 F (36.7 C) (Oral)  Resp 18  Ht  (1.727 m)  Wt 180 lb (81.647 kg)  BMI 27.38 kg/m2  SpO2 90%  Physical Exam  Nursing note and vitals reviewed. Constitutional: He is  oriented to person, place, and time. He appears well-developed and well-nourished. No distress.  HENT:  Head: Normocephalic and atraumatic.  Eyes: Conjunctivae and EOM are normal.  Neck: Neck supple. No tracheal deviation present.  Cardiovascular: Normal rate.   Pulmonary/Chest: Effort normal. No respiratory distress.  Musculoskeletal: Normal range of motion.  Neurological: He is alert and oriented to person, place, and time.  Skin: Skin is warm and dry.  Psychiatric: He has a normal mood and affect. His behavior is normal.    ED Course  Procedures (including critical care time)  DIAGNOSTIC STUDIES: Oxygen Saturation is 90% on [place], adequate by my interpretation.    COORDINATION OF CARE:  4:06 PM- Discussed treatment plan with patient which includes a non-narcotic pain reliever. The pt became hostile, stating "This is bullshit. [facility]." He then waved his cane towards the provider and left without further treatment. He was ambulatory.   Labs Review Labs Reviewed - No data to display  Imaging Review Dg Knee 1-2 Views Right  06/29/2014   CLINICAL DATA:  Knee pain.  EXAM: RIGHT KNEE - 1-2 VIEW  COMPARISON:  06/25/2014.  FINDINGS: Soft tissue structures are unremarkable. No acute bony or joint abnormality identified. No evidence of fracture or dislocation. Mild osteopenia.  IMPRESSION: No acute abnormality.  Mild osteopenia.   Electronically Signed   By: Maisie Fus  Register   On: 06/29/2014 14:23     EKG Interpretation None      MDM Pt seen at [facility] on 26 and 28.   Pt advised he needs to discuss pain medication with his primary care MD.   Pt offered torodol and treatment but he refused.    Final diagnoses:  Chronic knee pain, right       I personally performed the services in this documentation, which was scribed in my presence.  The recorded information has been reviewed and considered.   [place] Pall.  Elson Areas, PA-C 06/29/14 1633  Lonia Skinner [place],  [place] 06/29/14 [phone]

## 2014-06-29 NOTE — ED Notes (Signed)
Patient refused to sign discharge and was upset that he did not get dilaudid. He was here with another family member that requested the same

## 2014-07-02 NOTE — ED Provider Notes (Signed)
[name]: [id number]     Arrival date & time 06/27/14  1558 History   First MD Initiated Contact with Patient 06/27/14 2013     Chief Complaint  Patient presents with  . Addiction Problem     (Consider location/radiation/quality/duration/timing/severity/associated sxs/prior Treatment) HPI  58 year old male with right knee pain. Chronic in nature. Patient requesting additional pain medication. Than stating he wanted detox. Then stated he wanted Suboxone. Then asking for oxycodone. Denies any new injury. No swelling. No acute numbness or tingling. Can ambulate although reports he needs pain.  Past Medical History  Diagnosis Date  . COPD (chronic obstructive pulmonary disease)   . Hiatal hernia   . Emphysema   . Emphysema   . Bipolar disorder   . Hypertension   . Shortness of breath   . GERD (gastroesophageal reflux disease)   . Headache(784.0)   . Arthritis    Past Surgical History  Procedure Laterality Date  . Artheroscopic knee surgery r Right 01/2013   History reviewed. No pertinent family history. History  Substance Use Topics  . Smoking status: Current Every Day Smoker -- 0.50 packs/day for 43 years    Types: Cigarettes  . Smokeless tobacco: Never Used  . Alcohol Use: No    Review of Systems  All systems reviewed and negative, other than as noted in HPI.   Allergies  Codeine  Home Medications   Prior to Admission medications   Medication Sig Start Date End Date Taking? Authorizing Provider  albuterol (PROVENTIL HFA;VENTOLIN HFA) 108 (90 BASE) MCG/ACT inhaler Inhale 2 puffs into the lungs every 4 (four) hours as needed. For shortness of breath   Yes Historical Provider, MD  albuterol (PROVENTIL) (2.5 MG/3ML) 0.083% nebulizer solution Take 2.5 mg by nebulization every 6 (six) hours as needed. For shortness of breath   Yes Historical Provider, MD  ALPRAZolam Prudy Feeler) 1 MG tablet Take 1 mg by mouth 3 (three) times daily as needed for anxiety.    Yes Historical Provider,  MD  atenolol-chlorthalidone (TENORETIC) 100-25 MG per tablet Take 0.5 tablets by mouth daily.   Yes Historical Provider, MD  budesonide-formoterol (SYMBICORT) 160-4.5 MCG/ACT inhaler Inhale 2 puffs into the lungs 2 (two) times daily.   Yes Historical Provider, MD  lithium carbonate 300 MG capsule Take 300 mg by mouth 3 (three) times daily with meals.   Yes Historical Provider, MD  Omega-3 Fatty Acids (FISH OIL) 500 MG CAPS Take 500 mg by mouth daily.   Yes Historical Provider, MD  omeprazole (PRILOSEC) 20 MG capsule Take 20 mg by mouth daily as needed (for acid reflux/indigestion).   Yes Historical Provider, MD  oxyCODONE-acetaminophen (PERCOCET/ROXICET) 5-325 MG per tablet Take 2 tablets by mouth 3 (three) times daily as needed for moderate pain or severe pain.    Yes Historical Provider, MD  cloNIDine (CATAPRES) 0.1 MG tablet Take 1 tablet (0.1 mg total) by mouth 3 (three) times daily. 06/27/14   Raeford Razor, MD  LORazepam (ATIVAN) 1 MG tablet Take 0.5 tablets (0.5 mg total) by mouth every 6 (six) hours as needed for anxiety. 06/27/14   Raeford Razor, MD  ondansetron (ZOFRAN) 4 MG tablet Take 1 tablet (4 mg total) by mouth 4 (four) times daily as needed for nausea or vomiting. 06/27/14   Raeford Razor, MD   BP 136/85  Pulse 94  Temp(Src) 98 F (36.7 C) (Oral)  Resp 16  SpO2 94% Physical Exam  Nursing note and vitals reviewed. Constitutional: He appears well-developed and well-nourished. No distress.  HENT:  Head: Normocephalic and atraumatic.  Eyes: Conjunctivae are normal. Right eye exhibits no discharge. Left eye exhibits no discharge.  Neck: Neck supple.  Cardiovascular: Normal rate, regular rhythm and normal heart sounds.  Exam reveals no gallop and no friction rub.   No murmur heard. Pulmonary/Chest: Effort normal and breath sounds normal. No respiratory distress.  Abdominal: Soft. He exhibits no distension. There is no tenderness.  Musculoskeletal: He exhibits no edema and no  tenderness.  Right knee grossly normal in appearance. Symmetric as compared to left. No deformity. No concerning skin lesions. No effusion. Can actively range although he reports increased pain with this. Neurovascular intact distally. No significant bony tenderness appreciated.  Neurological: He is alert.  Skin: Skin is warm and dry.  Psychiatric: He has a normal mood and affect. His behavior is normal. Thought content normal.    ED Course  Procedures (including critical care time) Labs Review Labs Reviewed - No data to display  Imaging Review No results found.   EKG Interpretation None      MDM   Final diagnoses:  Opioid dependence with withdrawal    58 year old male with chronic pain. His knee exam is unremarkable. Patient had numerous request for pain medication. When discussing the role of the emergency department in the treatment of chronic pain became increasingly upset when I essentially told him that I would not be giving him narcotics. He began bargaining. Eventually did give him some prescriptions to help with withdrawal symptoms. Discussed the need to followup with either PCP, orthopedics or pain management. He left angry, but in no acute distress.    Raeford Razor, MD 07/02/14 1407

## 2014-07-24 ENCOUNTER — Encounter (HOSPITAL_COMMUNITY): Payer: Self-pay | Admitting: Emergency Medicine

## 2014-07-24 ENCOUNTER — Emergency Department (INDEPENDENT_AMBULATORY_CARE_PROVIDER_SITE_OTHER)
Admission: EM | Admit: 2014-07-24 | Discharge: 2014-07-24 | Discharge status: Against Medical Advice | Payer: Medicare HMO | Source: Home / Self Care

## 2014-07-24 DIAGNOSIS — M25519 Pain in unspecified shoulder: Secondary | ICD-10-CM

## 2014-07-24 DIAGNOSIS — M25559 Pain in unspecified hip: Secondary | ICD-10-CM

## 2014-07-24 DIAGNOSIS — W102XXA Fall (on)(from) incline, initial encounter: Secondary | ICD-10-CM

## 2014-07-24 DIAGNOSIS — G894 Chronic pain syndrome: Secondary | ICD-10-CM

## 2014-07-24 DIAGNOSIS — M25511 Pain in right shoulder: Secondary | ICD-10-CM

## 2014-07-24 DIAGNOSIS — W108XXA Fall (on) (from) other stairs and steps, initial encounter: Secondary | ICD-10-CM

## 2014-07-24 DIAGNOSIS — M25551 Pain in right hip: Secondary | ICD-10-CM

## 2014-07-24 DIAGNOSIS — F1110 Opioid abuse, uncomplicated: Secondary | ICD-10-CM

## 2014-07-24 MED ORDER — TRAMADOL HCL 50 MG PO TABS
50.0000 mg | ORAL_TABLET | Freq: Four times a day (QID) | ORAL | Status: DC | PRN
Start: 2014-07-24 — End: 2014-12-23

## 2014-07-24 NOTE — Discharge Instructions (Signed)
Chronic Pain Chronic pain can be defined as pain that is off and on and lasts for 3-6 months or longer. Many things cause chronic pain, which can make it difficult to make a diagnosis. There are many treatment options available for chronic pain. However, finding a treatment that works well for you may require trying various approaches until the right one is found. Many people benefit from a combination of two or more types of treatment to control their pain. SYMPTOMS  Chronic pain can occur anywhere in the body and can range from mild to very severe. Some types of chronic pain include:  Headache.  Low back pain.  Cancer pain.  Arthritis pain.  Neurogenic pain. This is pain resulting from damage to nerves. People with chronic pain may also have other symptoms such as:  Depression.  Anger.  Insomnia.  Anxiety. DIAGNOSIS  Your health care provider will help diagnose your condition over time. In many cases, the initial focus will be on excluding possible conditions that could be causing the pain. Depending on your symptoms, your health care provider may order tests to diagnose your condition. Some of these tests may include:   Blood tests.   CT scan.   MRI.   X-rays.   Ultrasounds.   Nerve conduction studies.  You may need to see a specialist.  TREATMENT  Finding treatment that works well may take time. You may be referred to a pain specialist. He or she may prescribe medicine or therapies, such as:   Mindful meditation or yoga.  Shots (injections) of numbing or pain-relieving medicines into the spine or area of pain.  Local electrical stimulation.  Acupuncture.   Massage therapy.   Aroma, color, light, or sound therapy.   Biofeedback.   Working with a physical therapist to keep from getting stiff.   Regular, gentle exercise.   Cognitive or behavioral therapy.   Group support.  Sometimes, surgery may be recommended.  HOME CARE INSTRUCTIONS    Take all medicines as directed by your health care provider.   Lessen stress in your life by relaxing and doing things such as listening to calming music.   Exercise or be active as directed by your health care provider.   Eat a healthy diet and include things such as vegetables, fruits, fish, and lean meats in your diet.   Keep all follow-up appointments with your health care provider.   Attend a support group with others suffering from chronic pain. SEEK MEDICAL CARE IF:   Your pain gets worse.   You develop a new pain that was not there before.   You cannot tolerate medicines given to you by your health care provider.   You have new symptoms since your last visit with your health care provider.  SEEK IMMEDIATE MEDICAL CARE IF:   You feel weak.   You have decreased sensation or numbness.   You lose control of bowel or bladder function.   Your pain suddenly gets much worse.   You develop shaking.  You develop chills.  You develop confusion.  You develop chest pain.  You develop shortness of breath.  MAKE SURE YOU:  Understand these instructions.  Will watch your condition.  Will get help right away if you are not doing well or get worse. Document Released: 07/09/2002 Document Revised: 06/19/2013 Document Reviewed: 04/12/2013 [facility] Patient Information 2015 [place], [place]. This information is not intended to replace advice given to you by your health care provider. Make sure you discuss any  questions you have with your health care provider.  Fall Prevention and Home Safety Falls cause injuries and can affect all age groups. It is possible to use preventive measures to significantly decrease the likelihood of falls. There are many simple measures which can make your home safer and prevent falls. OUTDOORS  Repair cracks and edges of walkways and driveways.  Remove high doorway thresholds.  Trim shrubbery on the main path into your  home.  Have good outside lighting.  Clear walkways of tools, rocks, debris, and clutter.  Check that handrails are not broken and are securely fastened. Both sides of steps should have handrails.  Have leaves, snow, and ice cleared regularly.  Use sand or salt on walkways during winter months.  In the garage, clean up grease or oil spills. BATHROOM  Install night lights.  Install grab bars by the toilet and in the tub and shower.  Use non-skid mats or decals in the tub or shower.  Place a plastic non-slip stool in the shower to sit on, if needed.  Keep floors dry and clean up all water on the floor immediately.  Remove soap buildup in the tub or shower on a regular basis.  Secure bath mats with non-slip, double-sided rug tape.  Remove throw rugs and tripping hazards from the floors. BEDROOMS  Install night lights.  Make sure a bedside light is easy to reach.  Do not use oversized bedding.  Keep a telephone by your bedside.  Have a firm chair with side arms to use for getting dressed.  Remove throw rugs and tripping hazards from the floor. KITCHEN  Keep handles on pots and pans turned toward the center of the stove. Use back burners when possible.  Clean up spills quickly and allow time for drying.  Avoid walking on wet floors.  Avoid hot utensils and knives.  Position shelves so they are not too high or low.  Place commonly used objects within easy reach.  If necessary, use a sturdy step stool with a grab bar when reaching.  Keep electrical cables out of the way.  Do not use floor polish or wax that makes floors slippery. If you must use wax, use non-skid floor wax.  Remove throw rugs and tripping hazards from the floor. STAIRWAYS  Never leave objects on stairs.  Place handrails on both sides of stairways and use them. Fix any loose handrails. Make sure handrails on both sides of the stairways are as long as the stairs.  Check carpeting to make  sure it is firmly attached along stairs. Make repairs to worn or loose carpet promptly.  Avoid placing throw rugs at the top or bottom of stairways, or properly secure the rug with carpet tape to prevent slippage. Get rid of throw rugs, if possible.  Have an electrician put in a light switch at the top and bottom of the stairs. OTHER FALL PREVENTION TIPS  Wear low-heel or rubber-soled shoes that are supportive and fit well. Wear closed toe shoes.  When using a stepladder, make sure it is fully opened and both spreaders are firmly locked. Do not climb a closed stepladder.  Add color or contrast paint or tape to grab bars and handrails in your home. Place contrasting color strips on first and last steps.  Learn and use mobility aids as needed. Install an electrical emergency response system.  Turn on lights to avoid dark areas. Replace light bulbs that burn out immediately. Get light switches that glow.  Arrange furniture  to create clear pathways. Keep furniture in the same place.  Firmly attach carpet with non-skid or double-sided tape.  Eliminate uneven floor surfaces.  Select a carpet pattern that does not visually hide the edge of steps.  Be aware of all pets. OTHER HOME SAFETY TIPS  Set the water temperature for 120 F (48.8 C).  Keep emergency numbers on or near the telephone.  Keep smoke detectors on every level of the home and near sleeping areas. Document Released: 10/07/2002 Document Revised: 04/17/2012 Document Reviewed: 01/06/2012 [facility] Patient Information 2015 [place], [place]. This information is not intended to replace advice given to you by your health care provider. Make sure you discuss any questions you have with your health care provider.  Hip Pain Your hip is the joint between your upper legs and your lower pelvis. The bones, cartilage, tendons, and muscles of your hip joint perform a lot of work each day supporting your body weight and allowing you to move  around. Hip pain can range from a minor ache to severe pain in one or both of your hips. Pain may be felt on the inside of the hip joint near the groin, or the outside near the buttocks and upper thigh. You may have swelling or stiffness as well.  HOME CARE INSTRUCTIONS   Take medicines only as directed by your health care provider.  Apply ice to the injured area:  Put ice in a plastic bag.  Place a towel between your skin and the bag.  Leave the ice on for 15-20 minutes at a time, 3-4 times a day.  Keep your leg raised (elevated) when possible to lessen swelling.  Avoid activities that cause pain.  Follow specific exercises as directed by your health care provider.  Sleep with a pillow between your legs on your most comfortable side.  Record how often you have hip pain, the location of the pain, and what it feels like. SEEK MEDICAL CARE IF:   You are unable to put weight on your leg.  Your hip is red or swollen or very tender to touch.  Your pain or swelling continues or worsens after 1 week.  You have increasing difficulty walking.  You have a fever. SEEK IMMEDIATE MEDICAL CARE IF:   You have fallen.  You have a sudden increase in pain and swelling in your hip. MAKE SURE YOU:   Understand these instructions.  Will watch your condition.  Will get help right away if you are not doing well or get worse. Document Released: 04/06/2010 Document Revised: 03/03/2014 Document Reviewed: 06/13/2013 [facility] Patient Information 2015 [place], [place]. This information is not intended to replace advice given to you by your health care provider. Make sure you discuss any questions you have with your health care provider.  Opioid Use Disorder Opioid use disorder is a mental disorder. It is the continued nonmedical use of opioids in spite of risks to health and well-being. Misused opioids include the street drug heroin. They also include pain medicines such as morphine, hydrocodone,  oxycodone, and fentanyl. Opioids are very addictive. People who misuse opioids get an exaggerated feeling of well-being. Opioid use disorder often disrupts activities at home, work, or school. It may cause mental or physical problems.  A family history of opioid use disorder puts you at higher risk of it. People with opioid use disorder often misuse other drugs or have mental illness such as depression, posttraumatic stress disorder, or antisocial personality disorder. They also are at risk of suicide  and death from overdose. SIGNS AND SYMPTOMS  Signs and symptoms of opioid use disorder include:  Use of opioids in larger amounts or over a longer period than intended.  Unsuccessful attempts to cut down or control opioid use.  A lot of time spent obtaining, using, or recovering from the effects of opioids.  A strong desire or urge to use opioids (craving).  Continued use of opioids in spite of major problems at work, school, or home because of use.  Continued use of opioids in spite of relationship problems because of use.  Giving up or cutting down on important life activities because of opioid use.  Use of opioids over and over in situations when it is physically hazardous, such as driving a car.  Continued use of opioids in spite of a physical problem that is likely related to use. Physical problems can include:  Severe constipation.  Poor nutrition.  Infertility.  Tuberculosis.  Aspiration pneumonia.  Infections such as human immunodeficiency virus (HIV) and hepatitis (from injecting opioids).  Continued use of opioids in spite of a mental problem that is likely related to use. Mental problems can include:  Depression.  Anxiety.  Hallucinations.  Sleep problems.  Loss of sexual function.  Need to use more and more opioids to get the same effect, or lessened effect over time with use of the same amount (tolerance).  Having withdrawal symptoms when opioid use is  stopped, or using opioids to reduce or avoid withdrawal symptoms. Withdrawal symptoms include:  Depressed, anxious, or irritable mood.  Nausea, vomiting, diarrhea, or intestinal cramping.  Muscle aches or spasms.  Excessive tearing or runny nose.  Dilated pupils, sweating, or hairs standing on end.  Yawning.  Fever, raised blood pressure, or fast pulse.  Restlessness or trouble sleeping. This does not apply to people taking opioids for medical reasons only. DIAGNOSIS Opioid use disorder is diagnosed by your health care provider. You may be asked questions about your opioid use and and how it affects your life. A physical exam may be done. A drug screen may be ordered. You may be referred to a mental health professional. The diagnosis of opioid use disorder requires at least two symptoms within 12 months. The type of opioid use disorder you have depends on the number of signs and symptoms you have. The type may be:  Mild. Two or three signs and symptoms.   Moderate. Four or five signs and symptoms.   Severe. Six or more signs and symptoms. TREATMENT  Treatment is usually provided by mental health professionals with training in substance use disorders.The following options are available:  Detoxification.This is the first step in treatment for withdrawal. It is medically supervised withdrawal with the use of medicines. These medicines lessen withdrawal symptoms. They also raise the chance of becoming opioid free.  Counseling, also known as talk therapy. Talk therapy addresses the reasons you use opioids. It also addresses ways to keep you from using again (relapse). The goals of talk therapy are to avoid relapse by:  Identifying and avoiding triggers for use.  Finding healthy ways to cope with stress.  Learning how to handle cravings.  Support groups. Support groups provide emotional support, advice, and guidance.  A medicine that blocks opioid receptors in your brain. This  medicine can reduce opioid cravings that lead to relapse. This medicine also blocks the desired opioid effect when relapse occurs.  Opioids that are taken by mouth in place of the misused opioid (opioid maintenance treatment). These medicines  satisfy cravings but are safer than commonly misused opioids. This often is the best option for people who continue to relapse with other treatments. HOME CARE INSTRUCTIONS   Take medicines only as directed by your health care provider.  Check with your health care provider before starting new medicines.  Keep all follow-up visits as directed by your health care provider. SEEK MEDICAL CARE IF:  You are not able to take your medicines as directed.  Your symptoms get worse. SEEK IMMEDIATE MEDICAL CARE IF:  You have serious thoughts about hurting yourself or others.  You may have taken an overdose of opioids. FOR MORE INFORMATION  National Institute on Drug Abuse: http://www.price-smith.com/  Substance Abuse and Mental Health Services Administration: SkateOasis.com.pt Document Released: 08/14/2007 Document Revised: 03/03/2014 Document Reviewed: 10/30/2013 [facility] Patient Information 2015 [place], [place]. This information is not intended to replace advice given to you by your health care provider. Make sure you discuss any questions you have with your health care provider.  Shoulder Pain The shoulder is the joint that connects your arm to your body. Muscles and band-like tissues that connect bones to muscles (tendons) hold the joint together. Shoulder pain is felt if an injury or medical problem affects one or more parts of the shoulder. HOME CARE   Put ice on the sore area.  Put ice in a plastic bag.  Place a towel between your skin and the bag.  Leave the ice on for 15-20 minutes, 03-04 times a day for the first 2 days.  Stop using cold packs if they do not help with the pain.  If you were given something to keep your shoulder from moving (sling;  shoulder immobilizer), wear it as told. Only take it off to shower or bathe.  Move your arm as little as possible, but keep your hand moving to prevent puffiness (swelling).  Squeeze a soft ball or foam pad as much as possible to help prevent swelling.  Take medicine as told by your doctor. GET HELP IF:  You have progressing new pain in your arm, hand, or fingers.  Your hand or fingers get cold.  Your medicine does not help lessen your pain. GET HELP RIGHT AWAY IF:   Your arm, hand, or fingers are numb or tingling.  Your arm, hand, or fingers are puffy (swollen), painful, or turn white or blue. MAKE SURE YOU:   Understand these instructions.  Will watch your condition.  Will get help right away if you are not doing well or get worse. Document Released: 04/04/2008 Document Revised: 03/03/2014 Document Reviewed: 04/30/2012 [facility] Patient Information 2015 [place], [place]. This information is not intended to replace advice given to you by your health care provider. Make sure you discuss any questions you have with your health care provider.

## 2014-07-24 NOTE — ED Notes (Signed)
Pt  Fell  Down  Some  Stairs  This  Am  He  Reports  Pain  r  Shoulder       r hip  And         Neck         -  Pt  Has  A  History  Of  Copd            And  Is  A  Smoker         And is  Walking  Slowly  With      A  Cane          He  Is  Awake  And  Alert  And  Oriented    Skin is  Warm  And  Dry          Color  Is  Dusky  With  Delayed  Cap  Refill

## 2014-07-24 NOTE — ED Notes (Signed)
Pt refused xray states that he needs to go fix his flat tire and that he does not have time to wait for a xray. Pt was educated on the need to get xray by medical staff pt still refused xray.

## 2014-07-24 NOTE — ED Provider Notes (Signed)
[name]: [id number]     Arrival date & time 07/24/14  1136 History   First MD Initiated Contact with Patient 07/24/14 1224     No chief complaint on file.  (Consider location/radiation/quality/duration/timing/severity/associated sxs/prior Treatment) HPI Comments: 58 year old male states he fell down the steps this morning. He is complaining of pain to the right heel and thigh, soreness in the right side of his neck and pain to the right shoulder. He apparently fell onto his right shoulder and slid down some stairs. Is able to stand and bear full weight. He denies striking his head, loss of consciousness or problems with vision, hearing, swallowing or speech. States he has had surgery on his right lower extremity in the past and since then has had chronic pain to his thigh and knee. Denies injury to his knee today.   Past Medical History  Diagnosis Date  . COPD (chronic obstructive pulmonary disease)   . Hiatal hernia   . Emphysema   . Emphysema   . Bipolar disorder   . Hypertension   . Shortness of breath   . GERD (gastroesophageal reflux disease)   . Headache(784.0)   . Arthritis    Past Surgical History  Procedure Laterality Date  . Artheroscopic knee surgery r Right 01/2013   History reviewed. No pertinent family history. History  Substance Use Topics  . Smoking status: Current Every Day Smoker -- 0.50 packs/day for 43 years    Types: Cigarettes  . Smokeless tobacco: Never Used  . Alcohol Use: No    Review of Systems  Constitutional: Negative.   HENT: Negative.   Respiratory: Negative.  Negative for shortness of breath.   Cardiovascular: Negative.   Gastrointestinal: Negative.   Genitourinary: Negative.   Musculoskeletal:       As per HPI  Skin: Negative.        Mild redness to the right upper arm.  Neurological: Negative for dizziness, weakness, numbness and headaches.    Allergies  Codeine  Home Medications   Prior to Admission medications   Medication Sig  Start Date End Date Taking? Authorizing Provider  albuterol (PROVENTIL HFA;VENTOLIN HFA) 108 (90 BASE) MCG/ACT inhaler Inhale 2 puffs into the lungs every 4 (four) hours as needed. For shortness of breath    Historical Provider, MD  albuterol (PROVENTIL) (2.5 MG/3ML) 0.083% nebulizer solution Take 2.5 mg by nebulization every 6 (six) hours as needed. For shortness of breath    Historical Provider, MD  ALPRAZolam Prudy Feeler) 1 MG tablet Take 1 mg by mouth 3 (three) times daily as needed for anxiety.     Historical Provider, MD  atenolol-chlorthalidone (TENORETIC) 100-25 MG per tablet Take 0.5 tablets by mouth daily.    Historical Provider, MD  budesonide-formoterol (SYMBICORT) 160-4.5 MCG/ACT inhaler Inhale 2 puffs into the lungs 2 (two) times daily.    Historical Provider, MD  cloNIDine (CATAPRES) 0.1 MG tablet Take 1 tablet (0.1 mg total) by mouth 3 (three) times daily. 06/27/14   Raeford Razor, MD  lithium carbonate 300 MG capsule Take 300 mg by mouth 3 (three) times daily with meals.    Historical Provider, MD  LORazepam (ATIVAN) 1 MG tablet Take 0.5 tablets (0.5 mg total) by mouth every 6 (six) hours as needed for anxiety. 06/27/14   Raeford Razor, MD  Omega-3 Fatty Acids (FISH OIL) 500 MG CAPS Take 500 mg by mouth daily.    Historical Provider, MD  omeprazole (PRILOSEC) 20 MG capsule Take 20 mg by mouth daily as needed (for  acid reflux/indigestion).    Historical Provider, MD  ondansetron (ZOFRAN) 4 MG tablet Take 1 tablet (4 mg total) by mouth 4 (four) times daily as needed for nausea or vomiting. 06/27/14   Raeford Razor, MD  oxyCODONE-acetaminophen (PERCOCET/ROXICET) 5-325 MG per tablet Take 2 tablets by mouth 3 (three) times daily as needed for moderate pain or severe pain.     Historical Provider, MD  traMADol (ULTRAM) 50 MG tablet Take 1 tablet (50 mg total) by mouth every 6 (six) hours as needed. 07/24/14   Hayden Rasmussen, NP   BP 140/80  Pulse 70  Temp(Src) 98.6 F (37 C) (Oral)  Resp 20  SpO2  92% Physical Exam  Nursing note and vitals reviewed. Constitutional: He is oriented to person, place, and time. He appears well-developed and well-nourished.  HENT:  Head: Normocephalic and atraumatic.  Eyes: Conjunctivae and EOM are normal. Left eye exhibits no discharge.  Neck: Normal range of motion. Neck supple.  Minor tenderness to the right scalene muscle only. No spinal or other tenderness. No deformity.  Cardiovascular: Normal rate and normal heart sounds.   Pulmonary/Chest:  Bilat generalized coarseness  Musculoskeletal:  Neck and right shoulder with full ROM. Mild tenderness to the Deltoid muscle and outer upper arm. No asymmetry, swelling or deformity. Minor redness to the upper outer arm. Distal n/v and m/s intact. Radial pulse 2+ R hip with tenderness to the lateral and posterior aspect. Thigh tenderness but this is chronic. No asymmetry, int or ext rotation. Able to bear full weigt with limp. Distal N/V intact. Nl color  Neurological: He is alert and oriented to person, place, and time. No cranial nerve deficit.  Skin: Skin is warm and dry.  Psychiatric: He has a normal mood and affect.    ED Course  Procedures (including critical care time) Labs Review Labs Reviewed - No data to display  Imaging Review No results found.   MDM   1. Fall (on)(from) incline, initial encounter   2. Right shoulder pain   3. Right hip pain   4. Chronic pain disorder   5. Opioid abuse    Patient refuses to have x-ray of his right hip stating that he has to go because of a flat tire. Is apparently with a couple of other family members with musculoskeletal injuries. It is noted that he has been frequenting the emergency department several times in the past few months requesting pain medications and becoming argumentative and bargaining for those medications. He has chronic pain and evaluations have been the same. He has received multiple prescriptions for oxycodone and is requesting that  he receive more today. He states he has a tablets at home and will have taken up 6-8 tablets over the next 24 hours. He st requests opioids for his chronic hip, knee and hip pain. Will rx  tramadol #10 we will not feel his opioids. Emergency department has made the same statement.    Hayden Rasmussen, NP 07/24/14 1328  Hayden Rasmussen, NP 07/24/14 1331

## 2014-07-26 NOTE — ED Provider Notes (Signed)
Medical screening examination/treatment/procedure(s) were performed by a resident physician or non-physician practitioner and as the supervising physician I was immediately available for consultation/collaboration.  Raysha Tilmon, MD    Krisandra Bueno S Alice Vitelli, MD 07/26/14 0853 

## 2014-11-01 DIAGNOSIS — J449 Chronic obstructive pulmonary disease, unspecified: Secondary | ICD-10-CM | POA: Diagnosis not present

## 2014-11-01 DIAGNOSIS — J438 Other emphysema: Secondary | ICD-10-CM | POA: Diagnosis not present

## 2014-11-01 DIAGNOSIS — R0602 Shortness of breath: Secondary | ICD-10-CM | POA: Diagnosis not present

## 2014-11-05 DIAGNOSIS — R0602 Shortness of breath: Secondary | ICD-10-CM | POA: Diagnosis not present

## 2014-11-24 DIAGNOSIS — F319 Bipolar disorder, unspecified: Secondary | ICD-10-CM | POA: Diagnosis not present

## 2014-11-24 DIAGNOSIS — J449 Chronic obstructive pulmonary disease, unspecified: Secondary | ICD-10-CM | POA: Diagnosis not present

## 2014-11-24 DIAGNOSIS — I10 Essential (primary) hypertension: Secondary | ICD-10-CM | POA: Diagnosis not present

## 2014-11-24 DIAGNOSIS — K219 Gastro-esophageal reflux disease without esophagitis: Secondary | ICD-10-CM | POA: Diagnosis not present

## 2014-12-02 DIAGNOSIS — J438 Other emphysema: Secondary | ICD-10-CM | POA: Diagnosis not present

## 2014-12-02 DIAGNOSIS — J449 Chronic obstructive pulmonary disease, unspecified: Secondary | ICD-10-CM | POA: Diagnosis not present

## 2014-12-02 DIAGNOSIS — R0602 Shortness of breath: Secondary | ICD-10-CM | POA: Diagnosis not present

## 2014-12-23 ENCOUNTER — Encounter: Payer: Self-pay | Admitting: Pulmonary Disease

## 2014-12-23 ENCOUNTER — Ambulatory Visit (INDEPENDENT_AMBULATORY_CARE_PROVIDER_SITE_OTHER): Payer: Medicare HMO | Admitting: Pulmonary Disease

## 2014-12-23 VITALS — BP 132/74 | HR 83 | Temp 97.1°F | Ht 66.0 in | Wt 164.0 lb

## 2014-12-23 DIAGNOSIS — R070 Pain in throat: Secondary | ICD-10-CM

## 2014-12-23 DIAGNOSIS — J438 Other emphysema: Secondary | ICD-10-CM

## 2014-12-23 MED ORDER — TIOTROPIUM BROMIDE MONOHYDRATE 2.5 MCG/ACT IN AERS: INHALATION_SPRAY | RESPIRATORY_TRACT | Status: DC

## 2014-12-23 NOTE — Progress Notes (Signed)
   Subjective:    Patient ID: Antony HasteKenneth P Trivett, male    DOB: 01/21/1956, 59 y.o.   MRN: [id number]  HPI The patient is a 59 year old male who I've been asked to see for management of COPD. He has had pulmonary function studies in 2012 that showed moderate airflow obstruction, and unfortunately he has continued to smoke. He has been on Symbicort for a while, and felt it helped initially, but now it does not seem to be controlling his symptoms. He is having to use his albuterol rescue inhaler frequently. He tells me that he is been short of breath for years, but the last one year if this gotten significantly worse. He describes a one half block dyspnea on exertion at a moderate pace on flat ground, and will get winded walking up a flight of stairs or bringing groceries in from the car. He is currently wearing oxygen at night intermittently, and occasionally during the day when he is having breathing issues. He has a chronic cough with early morning white foamy mucus, but it does not extend into the day. He denies any lower extremity edema. He has had a chest x-ray in January of last year that showed no acute process. Finally, he is complaining of throat discomfort and hoarseness, and has not had an otolaryngology evaluation.   Review of Systems  Constitutional: Negative for fever and unexpected weight change.  HENT: Positive for congestion, postnasal drip, sore throat and trouble swallowing. Negative for dental problem, ear pain, nosebleeds, rhinorrhea, sinus pressure and sneezing.   Eyes: Negative for redness and itching.  Respiratory: Positive for cough, chest tightness, shortness of breath and wheezing.   Cardiovascular: Negative for palpitations and leg swelling.  Gastrointestinal: Negative for nausea and vomiting.  Genitourinary: Negative for dysuria.  Musculoskeletal: Negative for joint swelling.  Skin: Negative for rash.  Neurological: Negative for headaches.  Hematological: Does not  bruise/bleed easily.  Psychiatric/Behavioral: Positive for dysphoric mood. The patient is nervous/anxious.        Objective:   Physical Exam Constitutional:  Well developed, no acute distress  HENT:  Nares patent without discharge  Oropharynx without exudate, palate and uvula are normal  Eyes:  Perrla, eomi, no scleral icterus  Neck:  No JVD, no TMG  Cardiovascular:  Normal rate, regular rhythm, no rubs or gallops.  No murmurs        Intact distal pulses  Pulmonary :  decreased breath sounds, no stridor or respiratory distress   No rales, rhonchi, or wheezing  Abdominal:  Soft, nondistended, bowel sounds present.  No tenderness noted.   Musculoskeletal:  No lower extremity edema noted.  Lymph Nodes:  No cervical lymphadenopathy noted  Skin:  No cyanosis noted  Neurologic:  Alert, appropriate, moves all 4 extremities without obvious deficit.         Assessment & Plan:

## 2014-12-23 NOTE — Assessment & Plan Note (Signed)
The patient is complaining of throat discomfort and hoarseness, and has a long history of tobacco abuse. Will need an airway examination by otolaryngology.

## 2014-12-23 NOTE — Patient Instructions (Addendum)
Stay on symbicort 2 puffs am and pm everyday no matter what.  Keep mouth rinsed. Will add spiriva respimat 2 inhalations each am everyday.  Will give you a prescription for this as well.  Let us know if not covered by your insurance.  Do not use albuterol except for rescue.  Always sit down first to see if breathing settles down before reaching for inhaler.  You have to stop smoking if you want to get better. Will refer you to an ENT doctor to take a look at your throat/voice box. Wear your oxygen everynight during sleep no matter what.  Think about attending pulmonary rehab program, and I can make the referral.  Will check chest xray today since it has been over a year. Will call with results. followup with me again in 3mos, but call if having worsening breathing issues.

## 2014-12-23 NOTE — Assessment & Plan Note (Signed)
The patient has severe airflow limitation on his spirometry today, and has seen a significant decline in his FEV1 from 2012. I suspect the vast majority of this is fixed and related to his emphysema, although there may be a reversible component from airway inflammation related to his ongoing smoking. I have had a long discussion with him about the management of COPD, and stressed the importance of lung preservation with smoking cessation. So add an anticholinergic to his current regimen, and will refer him to pulmonary rehabilitation as well. I asked him to continue his oxygen at night, but it appears that he does not need it during the day except under extreme circumstances. He understands that it is going to be very difficult to make him better if he is not able to stop smoking.

## 2014-12-24 ENCOUNTER — Ambulatory Visit (INDEPENDENT_AMBULATORY_CARE_PROVIDER_SITE_OTHER)
Admission: RE | Admit: 2014-12-24 | Discharge: 2014-12-24 | Disposition: A | Discharge status: Home / Self Care | Payer: Commercial Managed Care - HMO | Source: Ambulatory Visit | Attending: Pulmonary Disease | Admitting: Pulmonary Disease

## 2014-12-24 DIAGNOSIS — J438 Other emphysema: Secondary | ICD-10-CM | POA: Diagnosis not present

## 2014-12-24 DIAGNOSIS — J449 Chronic obstructive pulmonary disease, unspecified: Secondary | ICD-10-CM | POA: Diagnosis not present

## 2014-12-26 ENCOUNTER — Encounter: Payer: Self-pay | Admitting: *Deleted

## 2014-12-31 ENCOUNTER — Telehealth: Payer: Self-pay | Admitting: Pulmonary Disease

## 2014-12-31 DIAGNOSIS — R0602 Shortness of breath: Secondary | ICD-10-CM | POA: Diagnosis not present

## 2014-12-31 DIAGNOSIS — J438 Other emphysema: Secondary | ICD-10-CM | POA: Diagnosis not present

## 2014-12-31 DIAGNOSIS — J449 Chronic obstructive pulmonary disease, unspecified: Secondary | ICD-10-CM | POA: Diagnosis not present

## 2014-12-31 NOTE — Telephone Encounter (Signed)
Notes Recorded by Barbaraann ShareKeith M Clance, MD on 12/24/2014 at 3:45 PM Let pt know that his cxr looks ok ---   I spoke with patient about results and he verbalized understanding and had no questions.

## 2015-01-08 ENCOUNTER — Telehealth (HOSPITAL_COMMUNITY): Payer: Self-pay

## 2015-01-08 NOTE — Telephone Encounter (Signed)
Called patient regarding entrance to Pulmonary Rehab.  Patient states that they are interested in attending the program.  Glen Lopez is going to verify insurance coverage and follow up.

## 2015-01-20 DIAGNOSIS — R1310 Dysphagia, unspecified: Secondary | ICD-10-CM | POA: Diagnosis not present

## 2015-01-20 DIAGNOSIS — R490 Dysphonia: Secondary | ICD-10-CM | POA: Diagnosis not present

## 2015-01-20 DIAGNOSIS — Z720 Tobacco use: Secondary | ICD-10-CM | POA: Diagnosis not present

## 2015-01-20 DIAGNOSIS — K219 Gastro-esophageal reflux disease without esophagitis: Secondary | ICD-10-CM | POA: Diagnosis not present

## 2015-01-20 DIAGNOSIS — B370 Candidal stomatitis: Secondary | ICD-10-CM | POA: Diagnosis not present

## 2015-01-21 ENCOUNTER — Other Ambulatory Visit: Payer: Self-pay | Admitting: Otolaryngology

## 2015-01-21 DIAGNOSIS — R4702 Dysphasia: Secondary | ICD-10-CM

## 2015-01-23 ENCOUNTER — Telehealth (HOSPITAL_COMMUNITY): Payer: Self-pay

## 2015-01-23 NOTE — Telephone Encounter (Signed)
Called patient to discuss Pulmonary Rehab.  Patient had stated during a previous phone conversation that he was interested in the program and was going to verify his insurance coverage. Attempted to call patient today but was not able to leave a message.  Sent letter in mail.  Will attempt additional follow ups.

## 2015-01-26 ENCOUNTER — Ambulatory Visit
Admission: RE | Admit: 2015-01-26 | Discharge: 2015-01-26 | Disposition: A | Discharge status: Home / Self Care | Payer: Commercial Managed Care - HMO | Source: Ambulatory Visit | Attending: Otolaryngology | Admitting: Otolaryngology

## 2015-01-26 ENCOUNTER — Telehealth (HOSPITAL_COMMUNITY): Payer: Self-pay

## 2015-01-26 DIAGNOSIS — R1310 Dysphagia, unspecified: Secondary | ICD-10-CM | POA: Diagnosis not present

## 2015-01-26 DIAGNOSIS — R4702 Dysphasia: Secondary | ICD-10-CM

## 2015-01-26 NOTE — Telephone Encounter (Signed)
Called patient regarding entrance to Pulmonary Rehab.  Patient states that they are interested in attending the program.  Dow is going to verify insurance coverage and follow up.   

## 2015-01-30 DIAGNOSIS — S5011XA Contusion of right forearm, initial encounter: Secondary | ICD-10-CM | POA: Diagnosis not present

## 2015-01-30 DIAGNOSIS — S8001XA Contusion of right knee, initial encounter: Secondary | ICD-10-CM | POA: Diagnosis not present

## 2015-01-30 DIAGNOSIS — S8991XA Unspecified injury of right lower leg, initial encounter: Secondary | ICD-10-CM | POA: Diagnosis not present

## 2015-01-30 DIAGNOSIS — Y999 Unspecified external cause status: Secondary | ICD-10-CM | POA: Diagnosis not present

## 2015-01-31 DIAGNOSIS — J449 Chronic obstructive pulmonary disease, unspecified: Secondary | ICD-10-CM | POA: Diagnosis not present

## 2015-01-31 DIAGNOSIS — R0602 Shortness of breath: Secondary | ICD-10-CM | POA: Diagnosis not present

## 2015-01-31 DIAGNOSIS — J438 Other emphysema: Secondary | ICD-10-CM | POA: Diagnosis not present

## 2015-02-01 DIAGNOSIS — S8981XA Other specified injuries of right lower leg, initial encounter: Secondary | ICD-10-CM | POA: Diagnosis not present

## 2015-02-03 DIAGNOSIS — M549 Dorsalgia, unspecified: Secondary | ICD-10-CM | POA: Diagnosis not present

## 2015-02-12 ENCOUNTER — Telehealth (HOSPITAL_COMMUNITY): Payer: Self-pay

## 2015-02-16 ENCOUNTER — Telehealth (HOSPITAL_COMMUNITY): Payer: Self-pay

## 2015-02-16 NOTE — Telephone Encounter (Signed)
I have called and left a message with Ryu to inquire about participation in Pulmonary Rehab per Dr. Clance's referral. Will send letter in mail and follow up.  

## 2015-02-20 ENCOUNTER — Telehealth (HOSPITAL_COMMUNITY): Payer: Self-pay

## 2015-02-20 NOTE — Telephone Encounter (Signed)
Called patient to follow up on Pulmonary Rehab referral.  I was not able to leave a message. I have already sent a letter.  Will attempt another time at a later date.

## 2015-02-23 DIAGNOSIS — M545 Low back pain: Secondary | ICD-10-CM | POA: Diagnosis not present

## 2015-02-26 DIAGNOSIS — W108XXA Fall (on) (from) other stairs and steps, initial encounter: Secondary | ICD-10-CM | POA: Diagnosis not present

## 2015-02-26 DIAGNOSIS — M25562 Pain in left knee: Secondary | ICD-10-CM | POA: Diagnosis not present

## 2015-02-26 DIAGNOSIS — M542 Cervicalgia: Secondary | ICD-10-CM | POA: Diagnosis not present

## 2015-02-26 DIAGNOSIS — M25552 Pain in left hip: Secondary | ICD-10-CM | POA: Diagnosis not present

## 2015-02-26 DIAGNOSIS — M25522 Pain in left elbow: Secondary | ICD-10-CM | POA: Diagnosis not present

## 2015-02-26 DIAGNOSIS — F419 Anxiety disorder, unspecified: Secondary | ICD-10-CM | POA: Diagnosis not present

## 2015-02-26 DIAGNOSIS — F319 Bipolar disorder, unspecified: Secondary | ICD-10-CM | POA: Diagnosis not present

## 2015-02-26 DIAGNOSIS — S4992XA Unspecified injury of left shoulder and upper arm, initial encounter: Secondary | ICD-10-CM | POA: Diagnosis not present

## 2015-02-26 DIAGNOSIS — M47812 Spondylosis without myelopathy or radiculopathy, cervical region: Secondary | ICD-10-CM | POA: Diagnosis not present

## 2015-02-26 DIAGNOSIS — M19012 Primary osteoarthritis, left shoulder: Secondary | ICD-10-CM | POA: Diagnosis not present

## 2015-02-26 DIAGNOSIS — M25512 Pain in left shoulder: Secondary | ICD-10-CM | POA: Diagnosis not present

## 2015-02-26 DIAGNOSIS — F17200 Nicotine dependence, unspecified, uncomplicated: Secondary | ICD-10-CM | POA: Diagnosis not present

## 2015-02-26 DIAGNOSIS — S199XXA Unspecified injury of neck, initial encounter: Secondary | ICD-10-CM | POA: Diagnosis not present

## 2015-02-26 DIAGNOSIS — S59902A Unspecified injury of left elbow, initial encounter: Secondary | ICD-10-CM | POA: Diagnosis not present

## 2015-03-02 DIAGNOSIS — R0602 Shortness of breath: Secondary | ICD-10-CM | POA: Diagnosis not present

## 2015-03-02 DIAGNOSIS — J449 Chronic obstructive pulmonary disease, unspecified: Secondary | ICD-10-CM | POA: Diagnosis not present

## 2015-03-02 DIAGNOSIS — J438 Other emphysema: Secondary | ICD-10-CM | POA: Diagnosis not present

## 2015-03-03 DIAGNOSIS — R070 Pain in throat: Secondary | ICD-10-CM | POA: Diagnosis not present

## 2015-03-03 DIAGNOSIS — M545 Low back pain: Secondary | ICD-10-CM | POA: Diagnosis not present

## 2015-03-03 DIAGNOSIS — F319 Bipolar disorder, unspecified: Secondary | ICD-10-CM | POA: Diagnosis not present

## 2015-03-03 DIAGNOSIS — J383 Other diseases of vocal cords: Secondary | ICD-10-CM | POA: Diagnosis not present

## 2015-03-03 DIAGNOSIS — I10 Essential (primary) hypertension: Secondary | ICD-10-CM | POA: Diagnosis not present

## 2015-03-03 DIAGNOSIS — R1310 Dysphagia, unspecified: Secondary | ICD-10-CM | POA: Diagnosis not present

## 2015-03-03 DIAGNOSIS — Z720 Tobacco use: Secondary | ICD-10-CM | POA: Diagnosis not present

## 2015-03-05 ENCOUNTER — Telehealth (HOSPITAL_COMMUNITY): Payer: Self-pay

## 2015-03-05 NOTE — Telephone Encounter (Signed)
I have called and left a message with Iantha FallenKenneth to inquire about participation in Pulmonary Rehab per Dr. Teddy Spikelance's referral. Will send letter in mail and follow up.

## 2015-03-10 ENCOUNTER — Other Ambulatory Visit: Payer: Self-pay | Admitting: Otolaryngology

## 2015-03-10 DIAGNOSIS — R1310 Dysphagia, unspecified: Secondary | ICD-10-CM

## 2015-03-23 ENCOUNTER — Ambulatory Visit: Payer: Medicare HMO | Admitting: Pulmonary Disease

## 2015-03-23 DIAGNOSIS — M545 Low back pain: Secondary | ICD-10-CM | POA: Diagnosis not present

## 2015-04-02 DIAGNOSIS — J438 Other emphysema: Secondary | ICD-10-CM | POA: Diagnosis not present

## 2015-04-02 DIAGNOSIS — J449 Chronic obstructive pulmonary disease, unspecified: Secondary | ICD-10-CM | POA: Diagnosis not present

## 2015-04-02 DIAGNOSIS — R0602 Shortness of breath: Secondary | ICD-10-CM | POA: Diagnosis not present

## 2015-05-02 DIAGNOSIS — R0602 Shortness of breath: Secondary | ICD-10-CM | POA: Diagnosis not present

## 2015-05-02 DIAGNOSIS — J449 Chronic obstructive pulmonary disease, unspecified: Secondary | ICD-10-CM | POA: Diagnosis not present

## 2015-05-02 DIAGNOSIS — J438 Other emphysema: Secondary | ICD-10-CM | POA: Diagnosis not present

## 2015-05-21 DIAGNOSIS — M545 Low back pain: Secondary | ICD-10-CM | POA: Diagnosis not present

## 2015-06-02 DIAGNOSIS — J438 Other emphysema: Secondary | ICD-10-CM | POA: Diagnosis not present

## 2015-06-02 DIAGNOSIS — J449 Chronic obstructive pulmonary disease, unspecified: Secondary | ICD-10-CM | POA: Diagnosis not present

## 2015-06-02 DIAGNOSIS — R0602 Shortness of breath: Secondary | ICD-10-CM | POA: Diagnosis not present

## 2015-06-17 ENCOUNTER — Ambulatory Visit (INDEPENDENT_AMBULATORY_CARE_PROVIDER_SITE_OTHER): Payer: Commercial Managed Care - HMO | Admitting: Family Medicine

## 2015-06-17 ENCOUNTER — Ambulatory Visit (INDEPENDENT_AMBULATORY_CARE_PROVIDER_SITE_OTHER): Payer: Commercial Managed Care - HMO

## 2015-06-17 VITALS — BP 130/70 | HR 135 | Temp 98.2°F | Resp 52 | Ht 65.0 in | Wt 178.0 lb

## 2015-06-17 DIAGNOSIS — M542 Cervicalgia: Secondary | ICD-10-CM

## 2015-06-17 DIAGNOSIS — M25561 Pain in right knee: Secondary | ICD-10-CM

## 2015-06-17 DIAGNOSIS — M25511 Pain in right shoulder: Secondary | ICD-10-CM

## 2015-06-17 DIAGNOSIS — R1031 Right lower quadrant pain: Secondary | ICD-10-CM

## 2015-06-17 DIAGNOSIS — J439 Emphysema, unspecified: Secondary | ICD-10-CM | POA: Diagnosis not present

## 2015-06-17 DIAGNOSIS — T148XXA Other injury of unspecified body region, initial encounter: Secondary | ICD-10-CM

## 2015-06-17 MED ORDER — OXYCODONE-ACETAMINOPHEN 5-325 MG PO TABS: 1.0000 | ORAL_TABLET | Freq: Three times a day (TID) | ORAL | Status: DC | PRN
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
## 2015-07-03 DIAGNOSIS — R0602 Shortness of breath: Secondary | ICD-10-CM | POA: Diagnosis not present

## 2015-07-03 DIAGNOSIS — J449 Chronic obstructive pulmonary disease, unspecified: Secondary | ICD-10-CM | POA: Diagnosis not present

## 2015-07-03 DIAGNOSIS — J438 Other emphysema: Secondary | ICD-10-CM | POA: Diagnosis not present

## 2015-07-16 DIAGNOSIS — M25561 Pain in right knee: Secondary | ICD-10-CM | POA: Diagnosis not present

## 2015-07-16 DIAGNOSIS — F319 Bipolar disorder, unspecified: Secondary | ICD-10-CM | POA: Diagnosis not present

## 2015-07-16 DIAGNOSIS — M5136 Other intervertebral disc degeneration, lumbar region: Secondary | ICD-10-CM | POA: Diagnosis not present

## 2015-07-16 DIAGNOSIS — G894 Chronic pain syndrome: Secondary | ICD-10-CM | POA: Diagnosis not present

## 2015-08-02 DIAGNOSIS — J438 Other emphysema: Secondary | ICD-10-CM | POA: Diagnosis not present

## 2015-08-02 DIAGNOSIS — J449 Chronic obstructive pulmonary disease, unspecified: Secondary | ICD-10-CM | POA: Diagnosis not present

## 2015-08-02 DIAGNOSIS — R0602 Shortness of breath: Secondary | ICD-10-CM | POA: Diagnosis not present

## 2015-09-02 DIAGNOSIS — J438 Other emphysema: Secondary | ICD-10-CM | POA: Diagnosis not present

## 2015-09-02 DIAGNOSIS — R0602 Shortness of breath: Secondary | ICD-10-CM | POA: Diagnosis not present

## 2015-09-02 DIAGNOSIS — J449 Chronic obstructive pulmonary disease, unspecified: Secondary | ICD-10-CM | POA: Diagnosis not present

## 2015-09-07 DIAGNOSIS — I10 Essential (primary) hypertension: Secondary | ICD-10-CM | POA: Diagnosis not present

## 2015-09-07 DIAGNOSIS — J449 Chronic obstructive pulmonary disease, unspecified: Secondary | ICD-10-CM | POA: Diagnosis not present

## 2015-09-07 DIAGNOSIS — F319 Bipolar disorder, unspecified: Secondary | ICD-10-CM | POA: Diagnosis not present

## 2015-09-07 DIAGNOSIS — M545 Low back pain: Secondary | ICD-10-CM | POA: Diagnosis not present

## 2015-09-07 DIAGNOSIS — Z23 Encounter for immunization: Secondary | ICD-10-CM | POA: Diagnosis not present

## 2015-09-10 ENCOUNTER — Other Ambulatory Visit: Payer: Commercial Managed Care - HMO

## 2015-09-10 ENCOUNTER — Encounter: Payer: Self-pay | Admitting: Internal Medicine

## 2015-09-10 ENCOUNTER — Ambulatory Visit (INDEPENDENT_AMBULATORY_CARE_PROVIDER_SITE_OTHER)
Admission: RE | Admit: 2015-09-10 | Discharge: 2015-09-10 | Disposition: A | Discharge status: Home / Self Care | Payer: Commercial Managed Care - HMO | Source: Ambulatory Visit | Attending: Internal Medicine | Admitting: Internal Medicine

## 2015-09-10 ENCOUNTER — Ambulatory Visit (INDEPENDENT_AMBULATORY_CARE_PROVIDER_SITE_OTHER): Payer: Commercial Managed Care - HMO | Admitting: Internal Medicine

## 2015-09-10 VITALS — BP 104/70 | HR 115 | Ht 65.0 in | Wt 174.0 lb

## 2015-09-10 DIAGNOSIS — B370 Candidal stomatitis: Secondary | ICD-10-CM

## 2015-09-10 DIAGNOSIS — J9611 Chronic respiratory failure with hypoxia: Secondary | ICD-10-CM

## 2015-09-10 DIAGNOSIS — J441 Chronic obstructive pulmonary disease with (acute) exacerbation: Secondary | ICD-10-CM | POA: Insufficient documentation

## 2015-09-10 DIAGNOSIS — J449 Chronic obstructive pulmonary disease, unspecified: Secondary | ICD-10-CM | POA: Diagnosis not present

## 2015-09-10 DIAGNOSIS — I10 Essential (primary) hypertension: Secondary | ICD-10-CM | POA: Diagnosis not present

## 2015-09-10 DIAGNOSIS — R0902 Hypoxemia: Secondary | ICD-10-CM | POA: Diagnosis not present

## 2015-09-10 MED ORDER — LEVOFLOXACIN 500 MG PO TABS: 500.0000 mg | ORAL_TABLET | Freq: Every day | ORAL | Status: DC

## 2015-09-10 MED ORDER — PREDNISONE 10 MG PO TABS: ORAL_TABLET | ORAL | Status: DC

## 2015-09-10 MED ORDER — NYSTATIN 100000 UNIT/ML MT SUSP: 5.0000 mL | Freq: Four times a day (QID) | OROMUCOSAL | Status: DC

## 2015-09-10 MED ORDER — LEVALBUTEROL HCL 0.63 MG/3ML IN NEBU
0.6300 mg | INHALATION_SOLUTION | Freq: Once | RESPIRATORY_TRACT | Status: AC
  Administered 2015-09-10: 0.63 mg via RESPIRATORY_TRACT

## 2015-09-10 MED ORDER — METHYLPREDNISOLONE ACETATE 80 MG/ML IJ SUSP
80.0000 mg | Freq: Once | INTRAMUSCULAR | Status: AC
  Administered 2015-09-10: 80 mg via INTRAMUSCULAR

## 2015-09-10 NOTE — Patient Instructions (Addendum)
ICD-9-CM ICD-10-CM   1. COPD exacerbation (HCC) 491.21 J44.1   2. Chronic respiratory failure with hypoxia (HCC) 518.83 J96.11    799.02    3. Oral thrush 112.0 B37.0    COPD- deteriorated NEb treatment in office  IM steroid 80mg  x 1 in office CXR 2 view on way out - will cal with results Take levaquin 500mg  once daily  X 5 days Please take Take prednisone 40mg  once daily x 3 days, then 30mg  once daily x 3 days, then 20mg  once daily x 3 days, then prednisone 10mg  once daily  x 3 days and stop Mucinex OTC as needed  Continue o2, spiriva and symbicort as before + nebulizers as needed Check alpha 1 phenotype  # Oral thrush - new issue - For Oral thrush: Take Suspension (swish and swallow): 500,000 units 4 times/day for 5 days; swish in the mouth and retain for as long as possible (several minutes) before swallowing   If this does not work or feeling worse call 911 and go to ER  Followup PFT i n2-3 weeks OFfice visit after PFT  2 - 3 weeks with NP Tammy Parrett

## 2015-09-10 NOTE — Addendum Note (Signed)
Addended by: Nicanor AlconNAGLE, Merina Behrendt K on: 09/10/2015 05:22 PM   Modules accepted: Orders

## 2015-09-10 NOTE — Progress Notes (Signed)
Subjective:     Patient ID: Glen Lopez, male   DOB: December 02, 1955, 59 y.o.   MRN: [id number]  HPI   OV 09/10/2015  Chief Complaint  Patient presents with  . Follow-up    Pt a former [facility] pt. Pt states his breathing has slightly worsened since las OV with [facility] in 12/2014. Pt states he becomes SOB more quickly with less activity. Pt c/o prod cough with green mucus x 2 months. Pt also c/o midsternal CP when coughing and SOB.    former patient of Dr. Marcelyn Bruins for COPD and chronic respiratory failure. Here for routine follow-up. He presents with his wife. They both are not the greatest historians. He reports that at baseline he is oxygen dependent COPD and he lives at a level V out of 10 in terms of his quality of life and symptoms. But over the last month he's had worsening shortness of breath, cough, wheezing, change in color of sputum from white to gray green and increase in sputum volume. Symptoms been progressive. Currently symptoms are rated as severe. Symptoms are particularly worse at night associated with cough and wheezing. 2 nights ago he almost called emergency services. Currently symptoms are rated as 10 out of 10 in severity. In fact on the COPD cat score he is scoring 40 out of 46 extremely high symptom burden. He continues with his oxygen use. He denies any fever, chills but he does have chest tightness. No chest pain or hemoptysis or pedal edema or syncope. A nebulizer treatment in the morning at home helped.  Review of the chart shows that he has Gold stage III COPD based on spirometry 12/23/2014 with FEV1 1.42 L/43%. Personally visualized image. On further 24 2016 he also had a chest x-ray which I personally visualized that shows hyperinflation consistent with COPD.    CAT COPD Symptom & Quality of Life Score (GSK trademark) 0 is no burden. 5 is highest burden 09/10/2015   Never Cough -> Cough all the time 5  No phlegm in chest -> Chest is full of phlegm 5  No chest tightness  -> Chest feels very tight 5  No dyspnea for 1 flight stairs/hill -> Very dyspneic for 1 flight of stairs 5  No limitations for ADL at home -> Very limited with ADL at home 5  Confident leaving home -> Not at all confident leaving home 5  Sleep soundly -> Do not sleep soundly because of lung condition 5  Lots of Energy -> No energy at all 5  TOTAL Score (max 40)  40       has a past medical history of COPD (chronic obstructive pulmonary disease) (HCC); Hiatal hernia; Emphysema; Emphysema; Bipolar disorder (HCC); Hypertension; Shortness of breath; GERD (gastroesophageal reflux disease); Headache(784.0); Arthritis; Depression; Emphysema of lung (HCC); Anxiety; and Asthma.   reports that he has been smoking Cigarettes.  He has a 24 pack-year smoking history. He has never used smokeless tobacco.  Past Surgical History  Procedure Laterality Date  . Artheroscopic knee surgery r Right 01/2013    Allergies  Allergen Reactions  . Codeine Nausea And Vomiting    Immunization History  Administered Date(s) Administered  . Influenza,inj,Quad PF,36+ Mos 09/09/2015  . Influenza-Unspecified 07/31/2014  . Pneumococcal Polysaccharide-23 10/31/2013    Family History  Problem Relation Age of Onset  . Heart disease Father   . Asthma Sister      Current outpatient prescriptions:  .  albuterol (PROVENTIL HFA;VENTOLIN HFA) 108 (90 BASE) MCG/ACT  inhaler, Inhale 2 puffs into the lungs every 4 (four) hours as needed. For shortness of breath, Disp: , Rfl:  .  albuterol (PROVENTIL) (2.5 MG/3ML) 0.083% nebulizer solution, Take 2.5 mg by nebulization every 6 (six) hours as needed. For shortness of breath, Disp: , Rfl:  .  ALPRAZolam (XANAX) 1 MG tablet, Take 1 mg by mouth 3 (three) times daily as needed for anxiety. , Disp: , Rfl:  .  atenolol-chlorthalidone (TENORETIC) 100-25 MG per tablet, Take 0.5 tablets by mouth daily., Disp: , Rfl:  .  budesonide-formoterol (SYMBICORT) 160-4.5 MCG/ACT inhaler, Inhale  2 puffs into the lungs 2 (two) times daily., Disp: , Rfl:  .  cloNIDine (CATAPRES) 0.1 MG tablet, Take 1 tablet (0.1 mg total) by mouth 3 (three) times daily., Disp: 15 tablet, Rfl: 0 .  lithium carbonate 300 MG capsule, Take 300 mg by mouth 3 (three) times daily with meals., Disp: , Rfl:  .  LORazepam (ATIVAN) 1 MG tablet, Take 0.5 tablets (0.5 mg total) by mouth every 6 (six) hours as needed for anxiety., Disp: 10 tablet, Rfl: 0 .  Omega-3 Fatty Acids (FISH OIL) 500 MG CAPS, Take 500 mg by mouth daily., Disp: , Rfl:  .  omeprazole (PRILOSEC) 20 MG capsule, Take 20 mg by mouth daily as needed (for acid reflux/indigestion)., Disp: , Rfl:  .  Tiotropium Bromide Monohydrate (SPIRIVA RESPIMAT) 2.5 MCG/ACT AERS, 2 puffs each morning, Disp: 4 g, Rfl: 6     Review of Systems According to history of present illness    Objective:   Physical Exam  Constitutional: He is oriented to person, place, and time. He appears well-developed and well-nourished. No distress.  HENT:  Head: Normocephalic and atraumatic.  Right Ear: External ear normal.  Left Ear: External ear normal.  Mouth/Throat: Oropharynx is clear and moist. No oropharyngeal exudate.  He has dentures. There is oral thrush and is hard palate  Eyes: Conjunctivae and EOM are normal. Pupils are equal, round, and reactive to light. Right eye exhibits no discharge. Left eye exhibits no discharge. No scleral icterus.  Neck: Normal range of motion. Neck supple. No JVD present. No tracheal deviation present. No thyromegaly present.  Cardiovascular: Normal rate, regular rhythm and intact distal pulses.  Exam reveals no gallop and no friction rub.   No murmur heard. Pulmonary/Chest: Effort normal. No respiratory distress. He has wheezes. He has no rales. He exhibits no tenderness.  He is able to complete full sentences No accessory muscle use Mild purse her breathing Oxygen being used. Not paradoxical respiration  Abdominal: Soft. Bowel sounds  are normal. He exhibits no distension and no mass. There is no tenderness. There is no rebound and no guarding.  Musculoskeletal: Normal range of motion. He exhibits no edema or tenderness.  Lymphadenopathy:    He has no cervical adenopathy.  Neurological: He is alert and oriented to person, place, and time. He has normal reflexes. No cranial nerve deficit. Coordination normal.  Skin: Skin is warm and dry. No rash noted. He is not diaphoretic. No erythema. No pallor.  Tattoos on skin present  Psychiatric: He has a normal mood and affect. His behavior is normal. Judgment and thought content normal.  Nursing note and vitals reviewed.   Filed Vitals:   09/10/15 1000 09/10/15 1007  BP:  104/70  Pulse: 120 115  Height:  5\' 5"  (1.651 m)  Weight:  174 lb (78.926 kg)  SpO2: 83% 92%        Assessment:  ICD-9-CM ICD-10-CM   1. COPD exacerbation (HCC) 491.21 J44.1 DG Chest 2 View  2. Chronic respiratory failure with hypoxia (HCC) 518.83 J96.11 DG Chest 2 View   799.02    3. Oral thrush 112.0 B37.0 DG Chest 2 View       Plan:      NEb treatment in office  IM steroid  x 1 in office CXR 2 view on way out - will cal with results Take levaquin  once daily  X 5 days Please take Take prednisone  once daily x 3 days, then  once daily x 3 days, then  once daily x 3 days, then prednisone  once daily  x 3 days and stop Mucinex OTC as needed  Continue o2, spiriva and symbicort as before + nebulizers as needed Check alpha 1 antitrypsin phenotype  # Oral thrush-new issue - For Oral thrush: Take Suspension (swish and swallow): 500,000 units 4 times/day for 5 days; swish in the mouth and retain for as long as possible (several minutes) before swallowing   If this does not work or feeling worse call 911 and go to ER  Followup PFT i n2-3 weeks OFfice visit after PFT  2 - 3 weeks with NP Tammy Parrett

## 2015-09-11 ENCOUNTER — Telehealth: Payer: Self-pay | Admitting: Internal Medicine

## 2015-09-11 DIAGNOSIS — R9389 Abnormal findings on diagnostic imaging of other specified body structures: Secondary | ICD-10-CM

## 2015-09-11 NOTE — Telephone Encounter (Signed)
lmtcb X1 for pt to relay results/recs.  

## 2015-09-11 NOTE — Telephone Encounter (Signed)
Let Glen HasteKenneth P Bartling know that chest x-ray does not show pneumonia but he might have scar tissue Have him do high resolution CT chest without contrast supine and prone images 1 month from now at time of follow-up. Indication is possible interstitial lung disease and abnormal chest x-ray    -   Dg Chest 2 View  09/10/2015  CLINICAL DATA:  COPD.  Hypertension. EXAM: CHEST  2 VIEW COMPARISON:  12/24/2014. FINDINGS: Mediastinum hilar structures normal. Heart size stable. Low lung volumes with bibasilar atelectasis. Mild interstitial prominence of the lung bases noted. A component of these changes may be related to chronic interstitial lung disease and pleural parenchymal scarring, however basilar pneumonitis cannot be excluded. No pleural effusion or pneumothorax. Degenerative changes IMPRESSION: 1. Low lung volumes with basilar atelectasis. 2. Mild basilar interstitial prominence noted. A component of these changes may be related to chronic interstitial lung disease and pleural parenchymal scarring, however basilar pneumonitis cannot be excluded. Electronically Signed   By: Maisie Fushomas  Register   On: 09/10/2015 13:02

## 2015-09-14 NOTE — Telephone Encounter (Signed)
Spoke with pt regarding results. Order placed for HRCT with comments below. Nothing further needed

## 2015-09-15 DIAGNOSIS — S76811A Strain of other specified muscles, fascia and tendons at thigh level, right thigh, initial encounter: Secondary | ICD-10-CM | POA: Diagnosis not present

## 2015-09-17 LAB — ALPHA-1 ANTITRYPSIN PHENOTYPE: A-1 Antitrypsin: 269 mg/dL — ABNORMAL HIGH (ref 83–199)

## 2015-09-21 NOTE — Progress Notes (Signed)
Quick Note:  Called and spoke to pt. Informed him of the results per MR. Pt verbalized understanding and denied any further questions or concerns at this time.   ______ 

## 2015-09-28 ENCOUNTER — Ambulatory Visit: Payer: Commercial Managed Care - HMO | Admitting: Adult Health

## 2015-09-29 ENCOUNTER — Ambulatory Visit: Payer: Commercial Managed Care - HMO | Admitting: Adult Health

## 2015-10-02 DIAGNOSIS — J438 Other emphysema: Secondary | ICD-10-CM | POA: Diagnosis not present

## 2015-10-02 DIAGNOSIS — J449 Chronic obstructive pulmonary disease, unspecified: Secondary | ICD-10-CM | POA: Diagnosis not present

## 2015-10-02 DIAGNOSIS — R0602 Shortness of breath: Secondary | ICD-10-CM | POA: Diagnosis not present

## 2015-10-15 ENCOUNTER — Ambulatory Visit: Payer: Commercial Managed Care - HMO | Admitting: Adult Health

## 2015-10-19 ENCOUNTER — Ambulatory Visit (INDEPENDENT_AMBULATORY_CARE_PROVIDER_SITE_OTHER): Payer: Commercial Managed Care - HMO | Admitting: Adult Health

## 2015-10-19 ENCOUNTER — Encounter: Payer: Self-pay | Admitting: Adult Health

## 2015-10-19 VITALS — BP 138/82 | HR 93 | Temp 98.7°F | Ht 65.0 in | Wt 171.0 lb

## 2015-10-19 DIAGNOSIS — J441 Chronic obstructive pulmonary disease with (acute) exacerbation: Secondary | ICD-10-CM | POA: Diagnosis not present

## 2015-10-19 DIAGNOSIS — R0602 Shortness of breath: Secondary | ICD-10-CM | POA: Diagnosis not present

## 2015-10-19 NOTE — Patient Instructions (Addendum)
Continue on current regimen Work on not smoking  Follow up Dr. Marchelle Gearingamaswamy in 4 months and as needed

## 2015-10-19 NOTE — Progress Notes (Signed)
Subjective:    Patient ID: Glen Lopez, male    DOB: 08/10/1956, 59 y.o.   MRN: [id number]  HPI 59 year old male with a known history of COPD and chronic respiratory failure.  TEST  12/23/2014 with FEV1 1.42 L/43%.   10/19/2015 Follow up : COPD  Patient returns for a 6 week follow-up. Patient was seen last visit for COPD exacerbation. He was treated with antibiotics and steroids. He says that he is feeling much better. Breathing has returned back to his baseline. Chest x-ray at that time showed chronic changes. Patient had an alpha-1 testing that was normal. Spirometry today shows FEV1 34%, ratio 55, FVC 50 % Patient continues to smoke, smoking cessation discussed  He denies any hemoptysis, orthopnea, PND, chest pain or orthopnea.   Past Medical History  Diagnosis Date  . COPD (chronic obstructive pulmonary disease) (HCC)   . Hiatal hernia   . Emphysema   . Emphysema   . Bipolar disorder (HCC)   . Hypertension   . Shortness of breath   . GERD (gastroesophageal reflux disease)   . Headache(784.0)   . Arthritis   . Depression   . Emphysema of lung (HCC)   . Anxiety   . Asthma    Current Outpatient Prescriptions on File Prior to Visit  Medication Sig Dispense Refill  . albuterol (PROVENTIL HFA;VENTOLIN HFA) 108 (90 BASE) MCG/ACT inhaler Inhale 2 puffs into the lungs every 4 (four) hours as needed. For shortness of breath    . albuterol (PROVENTIL) (2.5 MG/3ML) 0.083% nebulizer solution Take 2.5 mg by nebulization every 6 (six) hours as needed. For shortness of breath    . ALPRAZolam (XANAX) 1 MG tablet Take 1 mg by mouth 3 (three) times daily as needed for anxiety.     Marland Kitchen. atenolol-chlorthalidone (TENORETIC) 100-25 MG per tablet Take 0.5 tablets by mouth daily.    . budesonide-formoterol (SYMBICORT) 160-4.5 MCG/ACT inhaler Inhale 2 puffs into the lungs 2 (two) times daily.    . cloNIDine (CATAPRES) 0.1 MG tablet Take 1 tablet (0.1 mg total) by mouth 3 (three) times  daily. 15 tablet 0  . lithium carbonate 300 MG capsule Take 300 mg by mouth 3 (three) times daily with meals.    Marland Kitchen. LORazepam (ATIVAN) 1 MG tablet Take 0.5 tablets (0.5 mg total) by mouth every 6 (six) hours as needed for anxiety. 10 tablet 0  . nystatin (MYCOSTATIN) 100000 UNIT/ML suspension Take 5 mLs (500,000 Units total) by mouth 4 (four) times daily. 100 mL 0  . Omega-3 Fatty Acids (FISH OIL) 500 MG CAPS Take 500 mg by mouth daily.    Marland Kitchen. omeprazole (PRILOSEC) 20 MG capsule Take 20 mg by mouth daily as needed (for acid reflux/indigestion).    . Tiotropium Bromide Monohydrate (SPIRIVA RESPIMAT) 2.5 MCG/ACT AERS 2 puffs each morning 4 g 6   No current facility-administered medications on file prior to visit.      Review of Systems    .Constitutional:   No  weight loss, night sweats,  Fevers, chills, +fatigue, or  lassitude.  HEENT:   No headaches,  Difficulty swallowing,  Tooth/dental problems, or  Sore throat,                No sneezing, itching, ear ache, nasal congestion, post nasal drip,   CV:  No chest pain,  Orthopnea, PND, swelling in lower extremities, anasarca, dizziness, palpitations, syncope.   GI  No heartburn, indigestion, abdominal pain, nausea, vomiting, diarrhea, change in bowel habits,  loss of appetite, bloody stools.   Resp:   No chest wall deformity  Skin: no rash or lesions.  GU: no dysuria, change in color of urine, no urgency or frequency.  No flank pain, no hematuria   MS:  No joint pain or swelling.  No decreased range of motion.  No back pain.  Psych:  No change in mood or affect. No depression or anxiety.  No memory loss.      Objective:   Physical Exam   Filed Vitals:   10/19/15 1614  BP: 138/82  Pulse: 93  Temp: 98.7 F (37.1 C)  TempSrc: Oral  Height:  (1.651 m)  Weight: 171 lb (77.565 kg)  SpO2: 93%    GEN: A/Ox3; pleasant , NAD   HEENT:  [place]/AT,  EACs-clear, TMs-wnl, NOSE-clear, THROAT-clear, no lesions, no postnasal drip or  exudate noted.   NECK:  Supple w/ fair ROM; no JVD; normal carotid impulses w/o bruits; no thyromegaly or nodules palpated; no lymphadenopathy.  RESP  Decreased BS in bases , .no accessory muscle use, no dullness to percussion  CARD:  RRR, no m/r/g  , no peripheral edema, pulses intact, no cyanosis or clubbing.  GI:   Soft & nt; nml bowel sounds; no organomegaly or masses detected.  Musco: Warm bil, no deformities or joint swelling noted.   Neuro: alert, no focal deficits noted.    Skin: Warm, no lesions or rashes         Assessment & Plan:

## 2015-10-19 NOTE — Assessment & Plan Note (Signed)
Recent flare now resolving  Lung fxn is decreased from earlier this year Discussed smoking cessation  Cont on current regimen

## 2015-10-21 ENCOUNTER — Inpatient Hospital Stay: Admission: RE | Admit: 2015-10-21 | Payer: Commercial Managed Care - HMO | Source: Ambulatory Visit

## 2015-11-02 DIAGNOSIS — M25552 Pain in left hip: Secondary | ICD-10-CM | POA: Diagnosis not present

## 2015-11-02 DIAGNOSIS — M542 Cervicalgia: Secondary | ICD-10-CM | POA: Diagnosis not present

## 2015-11-02 DIAGNOSIS — Y30XXXA Falling, jumping or pushed from a high place, undetermined intent, initial encounter: Secondary | ICD-10-CM | POA: Diagnosis not present

## 2015-11-02 DIAGNOSIS — J449 Chronic obstructive pulmonary disease, unspecified: Secondary | ICD-10-CM | POA: Diagnosis not present

## 2015-11-02 DIAGNOSIS — J438 Other emphysema: Secondary | ICD-10-CM | POA: Diagnosis not present

## 2015-11-02 DIAGNOSIS — R0602 Shortness of breath: Secondary | ICD-10-CM | POA: Diagnosis not present

## 2015-11-03 ENCOUNTER — Ambulatory Visit (INDEPENDENT_AMBULATORY_CARE_PROVIDER_SITE_OTHER)
Admission: RE | Admit: 2015-11-03 | Discharge: 2015-11-03 | Disposition: A | Discharge status: Home / Self Care | Payer: Commercial Managed Care - HMO | Source: Ambulatory Visit | Attending: Internal Medicine | Admitting: Internal Medicine

## 2015-11-03 DIAGNOSIS — R0602 Shortness of breath: Secondary | ICD-10-CM | POA: Diagnosis not present

## 2015-11-03 DIAGNOSIS — J439 Emphysema, unspecified: Secondary | ICD-10-CM | POA: Diagnosis not present

## 2015-11-03 DIAGNOSIS — R938 Abnormal findings on diagnostic imaging of other specified body structures: Secondary | ICD-10-CM | POA: Diagnosis not present

## 2015-11-06 ENCOUNTER — Other Ambulatory Visit: Payer: Self-pay | Admitting: Internal Medicine

## 2015-11-20 ENCOUNTER — Telehealth: Payer: Self-pay | Admitting: Internal Medicine

## 2015-11-20 NOTE — Telephone Encounter (Signed)
LEt patient know there is no cancer on CT chest but there are findings of some mild chronic infection that does not spread to others. Sometimes we Rx it and sometimes not. I wil discuss at fu April 2017   Dr. Kalman Shan, M.D., F.C.C.P Pulmonary and Critical Care Medicine Staff Physician [facility] Pager: [phone], If no answer or between  15:00h - 7:00h: call [phone]  11/20/2015 5:32 PM     IMPRESSION: 1. 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 2. No definite findings otherwise to suggest interstitial lung disease at this time. 3. Mild centrilobular and paraseptal emphysema. 4. Atherosclerosis, including left main and 3 vessel coronary artery disease. Please note that although the presence of coronary artery calcium documents the presence of coronary artery disease, the severity of this disease and any potential stenosis cannot be assessed on this non-gated CT examination. Assessment for potential risk factor modification, dietary therapy or pharmacologic therapy may be warranted, if clinically indicated. 5. The appearance of the liver suggest early cirrhosis. There is also evidence of hepatic steatosis.   Electronically Signed By: Trudie Reed M.D. On: 11/03/2015 13:32

## 2015-11-23 NOTE — Telephone Encounter (Signed)
Called and spoke to pt. Informed him of the results per MR. Pt verbalized understanding and denied any further questions or concerns at this time.  

## 2015-11-27 ENCOUNTER — Ambulatory Visit (INDEPENDENT_AMBULATORY_CARE_PROVIDER_SITE_OTHER): Payer: Commercial Managed Care - HMO

## 2015-11-27 ENCOUNTER — Ambulatory Visit (INDEPENDENT_AMBULATORY_CARE_PROVIDER_SITE_OTHER): Payer: Commercial Managed Care - HMO | Admitting: Family Medicine

## 2015-11-27 VITALS — BP 132/80 | HR 115 | Temp 97.9°F | Resp 17 | Ht 66.0 in | Wt 173.0 lb

## 2015-11-27 DIAGNOSIS — M545 Low back pain, unspecified: Secondary | ICD-10-CM

## 2015-11-27 DIAGNOSIS — M542 Cervicalgia: Secondary | ICD-10-CM | POA: Diagnosis not present

## 2015-11-27 DIAGNOSIS — M25551 Pain in right hip: Secondary | ICD-10-CM | POA: Diagnosis not present

## 2015-11-27 DIAGNOSIS — T148XXA Other injury of unspecified body region, initial encounter: Secondary | ICD-10-CM

## 2015-11-27 DIAGNOSIS — M25552 Pain in left hip: Secondary | ICD-10-CM | POA: Diagnosis not present

## 2015-11-27 DIAGNOSIS — M5136 Other intervertebral disc degeneration, lumbar region: Secondary | ICD-10-CM | POA: Diagnosis not present

## 2015-11-27 DIAGNOSIS — M47816 Spondylosis without myelopathy or radiculopathy, lumbar region: Secondary | ICD-10-CM | POA: Diagnosis not present

## 2015-11-27 DIAGNOSIS — M160 Bilateral primary osteoarthritis of hip: Secondary | ICD-10-CM | POA: Diagnosis not present

## 2015-11-27 MED ORDER — OXYCODONE-ACETAMINOPHEN 5-325 MG PO TABS: 1.0000 | ORAL_TABLET | Freq: Three times a day (TID) | ORAL | Status: DC | PRN

## 2015-11-27 MED ORDER — CYCLOBENZAPRINE HCL 5 MG PO TABS: 5.0000 mg | ORAL_TABLET | Freq: Every evening | ORAL | Status: DC | PRN

## 2015-11-27 NOTE — Patient Instructions (Signed)
Because you received an x-ray today, you will receive an invoice from [facility]. Please contact [facility] at [phone] with questions or concerns regarding your invoice. Our billing staff will not be able to assist you with those questions. °

## 2015-11-27 NOTE — Progress Notes (Signed)
Chief Complaint:  Chief Complaint  Patient presents with  . Back Pain  . Hip Pain    HPI: Glen Lopez is a 60 y.o. male who reports to [facility] today complaining of fell on stairway and was wathcing kisd and overstepped slipped and rolled down steps and he ahs a hx of herniated disc and and also hurt his hip.  He is having right hip pain and also his back. He has been crying because he was having such pain. He deneis any urinary incontinence. HE deneis and numbnes sor tingling. He was driven by his wife here. This happened 2 hours ago.   Past Medical History  Diagnosis Date  . COPD (chronic obstructive pulmonary disease) (HCC)   . Hiatal hernia   . Emphysema   . Emphysema   . Bipolar disorder (HCC)   . Hypertension   . Shortness of breath   . GERD (gastroesophageal reflux disease)   . Headache(784.0)   . Arthritis   . Depression   . Emphysema of lung (HCC)   . Anxiety   . Asthma    Past Surgical History  Procedure Laterality Date  . Artheroscopic knee surgery r Right 01/2013   Social History   Social History  . Marital Status: Married    Spouse Name: N/A  . Number of Children: 4  . Years of Education: N/A   Occupational History  . disbaled    Social History Main Topics  . Smoking status: Current Every Day Smoker -- 1.00 packs/day for 48 years    Types: Cigarettes  . Smokeless tobacco: Never Used     Comment: 2 cigs daily//09/10/15  . Alcohol Use: No  . Drug Use: No  . Sexual Activity: Not Asked   Other Topics Concern  . None   Social History Narrative   Family History  Problem Relation Age of Onset  . Heart disease Father   . Asthma Sister    Allergies  Allergen Reactions  . Codeine Nausea And Vomiting   Prior to Admission medications   Medication Sig Start Date End Date Taking? Authorizing Provider  albuterol (PROVENTIL HFA;VENTOLIN HFA) 108 (90 BASE) MCG/ACT inhaler Inhale 2 puffs into the lungs every 4 (four) hours as needed. For  shortness of breath   Yes Historical Provider, MD  albuterol (PROVENTIL) (2.5 MG/3ML) 0.083% nebulizer solution Take 2.5 mg by nebulization every 6 (six) hours as needed. For shortness of breath   Yes Historical Provider, MD  ALPRAZolam Prudy Feeler) 1 MG tablet Take 1 mg by mouth 3 (three) times daily as needed for anxiety.    Yes Historical Provider, MD  atenolol-chlorthalidone (TENORETIC) 100-25 MG per tablet Take 0.5 tablets by mouth daily.   Yes Historical Provider, MD  budesonide-formoterol (SYMBICORT) 160-4.5 MCG/ACT inhaler Inhale 2 puffs into the lungs 2 (two) times daily.   Yes Historical Provider, MD  cloNIDine (CATAPRES) 0.1 MG tablet Take 1 tablet (0.1 mg total) by mouth 3 (three) times daily. 06/27/14  Yes Raeford Razor, MD  lithium carbonate 300 MG capsule Take 300 mg by mouth 3 (three) times daily with meals.   Yes Historical Provider, MD  LORazepam (ATIVAN) 1 MG tablet Take 0.5 tablets (0.5 mg total) by mouth every 6 (six) hours as needed for anxiety. 06/27/14  Yes Raeford Razor, MD  nystatin (MYCOSTATIN) 100000 UNIT/ML suspension Take 5 mLs (500,000 Units total) by mouth 4 (four) times daily. 09/10/15  Yes Kalman Shan, MD  Omega-3 Fatty Acids (FISH OIL)  500 MG CAPS Take 500 mg by mouth daily.   Yes Historical Provider, MD  omeprazole (PRILOSEC) 20 MG capsule Take 20 mg by mouth daily as needed (for acid reflux/indigestion).   Yes Historical Provider, MD  Tiotropium Bromide Monohydrate (SPIRIVA RESPIMAT) 2.5 MCG/ACT AERS 2 puffs each morning 12/23/14  Yes Barbaraann Share, MD     ROS: The patient denies fevers, chills, night sweats, unintentional weight loss, chest pain, palpitations, wheezing, dyspnea on exertion, nausea, vomiting, abdominal pain, dysuria, hematuria, melena, acute numbness, weakness, or tingling.   All other systems have been reviewed and were otherwise negative with the exception of those mentioned in the HPI and as above.    PHYSICAL EXAM: Filed Vitals:   11/27/15  1541  BP: 132/80  Pulse: 115  Temp: 97.9 F (36.6 C)  Resp: 17   Body mass index is 27.94 kg/(m^2).   General: Alert, no acute distress HEENT:  Normocephalic, atraumatic, oropharynx patent. EOMI, PERRLA Cardiovascular:  Regular rate and rhythm, no rubs murmurs or gallops.  No pedal edema.  Respiratory: Clear to auscultation bilaterally.  No cyanosis, no use of accessory musculature Abdominal: No organomegaly, abdomen is soft and non-tender, positive bowel sounds. No masses. Skin: No rashes. Neurologic: Facial musculature symmetric. Psychiatric: Patient acts appropriately throughout our interaction. Lymphatic: No cervical or submandibular lymphadenopathy Musculoskeletal: Gait antalgic . No edema, tenderness in calf + paramsk tenderness  Decrease ROM  5/5 strength, 2/2 DTRs No saddle anesthesia Straight leg negative Hip -full ER and IR  5/5 strngth       LABS: Results for orders placed or performed in visit on 09/10/15  Alpha-1 antitrypsin phenotype  Result Value Ref Range   A-1 Antitrypsin 269 (H) 83 - 199 mg/dL   Results Received SEE NOTE      EKG/XRAY:   Primary read interpreted by Dr. Conley Rolls at [facility]. Neg for fracture or dislocation   ASSESSMENT/PLAN: Encounter Diagnoses  Name Primary?  . Midline low back pain without sciatica Yes  . Right hip pain   . Sprain and strain   . Primary osteoarthritis of both hips   . Spondylosis of lumbar region without myelopathy or radiculopathy    He was seen today for back and hip pain acute on chronic issue, xrays were taken, neg for anything acute HE recently got pain meds from [facility] and [place] controlled substance DB pulled, he states that he dumped all those meds into the toilet since made him sick and not helpful I have advised him that I can give him 15 pills of percocet and that is it. He needs to return to [facility] if they are the one managing his pain He can take msk relaxer he already has  IF he needs pain management  referral then he can get it through [place]  Gross sideeffects, risk and benefits, and alternatives of medications d/w patient. Patient is aware that all medications have potential sideeffects and we are unable to predict every sideeffect or drug-drug interaction that may occur.  Alhaji Mcneal DO  11/27/2015 5:30 PM

## 2015-11-30 ENCOUNTER — Ambulatory Visit (INDEPENDENT_AMBULATORY_CARE_PROVIDER_SITE_OTHER): Payer: Commercial Managed Care - HMO | Admitting: Urgent Care

## 2015-11-30 ENCOUNTER — Ambulatory Visit (INDEPENDENT_AMBULATORY_CARE_PROVIDER_SITE_OTHER): Payer: Commercial Managed Care - HMO

## 2015-11-30 VITALS — BP 158/94 | HR 71 | Temp 97.9°F | Resp 17 | Ht 65.5 in | Wt 170.0 lb

## 2015-11-30 DIAGNOSIS — M25511 Pain in right shoulder: Secondary | ICD-10-CM

## 2015-11-30 DIAGNOSIS — J438 Other emphysema: Secondary | ICD-10-CM

## 2015-11-30 DIAGNOSIS — R296 Repeated falls: Secondary | ICD-10-CM | POA: Diagnosis not present

## 2015-11-30 DIAGNOSIS — M542 Cervicalgia: Secondary | ICD-10-CM | POA: Diagnosis not present

## 2015-11-30 DIAGNOSIS — M25551 Pain in right hip: Secondary | ICD-10-CM

## 2015-11-30 DIAGNOSIS — W19XXXA Unspecified fall, initial encounter: Secondary | ICD-10-CM | POA: Diagnosis not present

## 2015-11-30 DIAGNOSIS — I10 Essential (primary) hypertension: Secondary | ICD-10-CM

## 2015-11-30 DIAGNOSIS — S4991XA Unspecified injury of right shoulder and upper arm, initial encounter: Secondary | ICD-10-CM | POA: Diagnosis not present

## 2015-11-30 DIAGNOSIS — S79911A Unspecified injury of right hip, initial encounter: Secondary | ICD-10-CM | POA: Diagnosis not present

## 2015-11-30 MED ORDER — OXYCODONE-ACETAMINOPHEN 5-325 MG PO TABS: 1.0000 | ORAL_TABLET | Freq: Three times a day (TID) | ORAL | Status: DC | PRN

## 2015-11-30 NOTE — Progress Notes (Signed)
MRN: [id number] DOB: [birth year]  Subjective:   Glen Lopez is a 60 y.o. male presenting for chief complaint of Arm Injury and Shoulder Injury  Reports falling down 1 step last night. He has since had right arm and shoulder pain, neck pain, right hip pain. Patient has difficulty with his balance, uses a cane, but also take multiple medications that can cause sedation and difficulty with balance. Patient has had several falls in the month. He was last seen on 11/27/2015 for a fall, x-rays were negative for acute process but showed he has bilateral hip arthritis. He also has multilevel DDD of lumbar spine. He takes Flexeril, Oxycodone for this and states that it has not helped his pain. Patient also uses Ativan and Xanax. Lastly, patient has COPD, still smokes 3-4ppd. He denies chest pain, shob, chest tightness, wheezing. He has home oxygen but is insisting that he doesn't need it at the moment.  Glen Lopez has a current medication list which includes the following prescription(s): albuterol, albuterol, alprazolam, atenolol-chlorthalidone, budesonide-formoterol, clonidine, cyclobenzaprine, lithium carbonate, lorazepam, nystatin, fish oil, omeprazole, oxycodone-acetaminophen, and tiotropium bromide monohydrate. Also is allergic to codeine.  Glen Lopez  has a past medical history of COPD (chronic obstructive pulmonary disease) (HCC); Hiatal hernia; Emphysema; Emphysema; Bipolar disorder (HCC); Hypertension; Shortness of breath; GERD (gastroesophageal reflux disease); Headache(784.0); Arthritis; Depression; Emphysema of lung (HCC); Anxiety; and Asthma. Also  has past surgical history that includes artheroscopic knee surgery R (Right, 01/2013).  Objective:   Vitals: BP 158/94 mmHg  Pulse 71  Temp(Src) 97.9 F (36.6 C) (Oral)  Resp 17  Ht 5' 5.5" (1.664 m)  Wt 170 lb (77.111 kg)  BMI 27.85 kg/m2  SpO2 90%  Physical Exam  Constitutional: He is oriented to person, place, and time. He appears  well-developed and well-nourished.  HENT:  Mouth/Throat: Oropharynx is clear and moist.  Eyes: Pupils are equal, round, and reactive to light. No scleral icterus.  Cardiovascular: Normal rate, regular rhythm and intact distal pulses.  Exam reveals no gallop and no friction rub.   No murmur heard. Pulmonary/Chest: No respiratory distress. He has no wheezes. He has rales (coarse lung sounds throughout).  Musculoskeletal:       Right shoulder: He exhibits decreased range of motion (flexion, extension), tenderness (over scapular spine, AC joint) and bony tenderness. He exhibits no swelling, no effusion, no crepitus, no deformity, no laceration and normal strength.       Right elbow: He exhibits normal range of motion, no swelling, no effusion, no deformity and no laceration. Tenderness found. Medial epicondyle and lateral epicondyle tenderness noted. No radial head and no olecranon process tenderness noted.       Right wrist: He exhibits normal range of motion, no tenderness, no bony tenderness, no swelling, no effusion, no crepitus, no deformity and no laceration.       Right hip: He exhibits tenderness (over area demarcated). He exhibits normal range of motion, normal strength, no bony tenderness, no swelling, no crepitus, no deformity and no laceration.       Legs: Neurological: He is alert and oriented to person, place, and time.  Skin: Skin is warm and dry. No rash noted. No erythema. No pallor.   Dg Cervical Spine Complete  11/30/2015  CLINICAL DATA:  Status post fall, neck pain, shoulder pain EXAM: CERVICAL SPINE - COMPLETE 4+ VIEW COMPARISON:  None. FINDINGS: There is no evidence of cervical spine fracture or prevertebral soft tissue swelling. Alignment is normal. No other significant bone  abnormalities are identified. IMPRESSION: Negative cervical spine radiographs. Electronically Signed   By: Elige Ko   On: 11/30/2015 12:30   Dg Shoulder Right  11/30/2015  CLINICAL DATA:  Fall, right  shoulder injury EXAM: RIGHT SHOULDER - 2+ VIEW COMPARISON:  None. FINDINGS: There is no evidence of fracture or dislocation. There is no evidence of arthropathy or other focal bone abnormality. Soft tissues are unremarkable. IMPRESSION: Negative. Electronically Signed   By: Esperanza Heir M.D.   On: 11/30/2015 12:36   Dg Hip Unilat W Or W/o Pelvis 2-3 Views Right  11/30/2015  CLINICAL DATA:  Right hip injury from fall; history previous it injury EXAM: DG HIP (WITH OR WITHOUT PELVIS) 2-3V RIGHT COMPARISON:  Hip series of November 27, 2015 FINDINGS: The bony pelvis is adequately mineralized. There is no acute pelvic fracture. The right hip exhibits mild narrowing of the joint space symmetrically. The articular surfaces remains smoothly rounded. The femoral neck, intertrochanteric, and subtrochanteric regions are normal. IMPRESSION: There is no acute bony abnormality of the right hip. There is very mild narrowing of the right hip joint space consistent with osteoarthritis. Electronically Signed   By: David  [place] M.D.   On: 11/30/2015 12:30   Assessment and Plan :   1. Fall, initial encounter 2. Frequent falls 3. Pain in joint of right shoulder 4. Neck pain 5. Right hip pain - Counseled patient extensively on his medications and recommended he recheck with his PCP for medication review. I suspect that this is a definite component of his frequent falls. Patient disagreed, requested additional pain medication. I only gave him 10 tablets which the patient thought he needed more. However, he has been given other pain meds by Dr. Conley Rolls and Dr. Purcell Nails in January and should have had more than enough still. Patient denied overusing these pain medications. I deferred back to his PCP, which the patient states he will set up an appointment.   6. Other emphysema (HCC) - Recheck with [facility]  7. Essential hypertension, benign - Not controlled, patient is to check back with his PCP for better  management.  Wallis Bamberg, PA-C [facility] [phone] 11/30/2015 11:29 AM

## 2015-11-30 NOTE — Patient Instructions (Addendum)
Because you received an x-ray today, you will receive an invoice from [facility]. Please contact [facility] at [phone] with questions or concerns regarding your invoice. Our billing staff will not be able to assist you with those questions.    Fall Prevention in the Home  Falls can cause injuries and can affect people from all age groups. There are many simple things that you can do to make your home safe and to help prevent falls. WHAT CAN I DO ON THE OUTSIDE OF MY HOME?  Regularly repair the edges of walkways and driveways and fix any cracks.  Remove high doorway thresholds.  Trim any shrubbery on the main path into your home.  Use bright outdoor lighting.  Clear walkways of debris and clutter, including tools and rocks.  Regularly check that handrails are securely fastened and in good repair. Both sides of any steps should have handrails.  Install guardrails along the edges of any raised decks or porches.  Have leaves, snow, and ice cleared regularly.  Use sand or salt on walkways during winter months.  In the garage, clean up any spills right away, including grease or oil spills. WHAT CAN I DO IN THE BATHROOM?  Use night lights.  Install grab bars by the toilet and in the tub and shower. Do not use towel bars as grab bars.  Use non-skid mats or decals on the floor of the tub or shower.  If you need to sit down while you are in the shower, use a plastic, non-slip stool.Marland Kitchen  Keep the floor dry. Immediately clean up any water that spills on the floor.  Remove soap buildup in the tub or shower on a regular basis.  Attach bath mats securely with double-sided non-slip rug tape.  Remove throw rugs and other tripping hazards from the floor. WHAT CAN I DO IN THE BEDROOM?  Use night lights.  Make sure that a bedside light is easy to reach.  Do not use oversized bedding that drapes onto the floor.  Have a firm chair that has side arms to use for  getting dressed.  Remove throw rugs and other tripping hazards from the floor. WHAT CAN I DO IN THE KITCHEN?   Clean up any spills right away.  Avoid walking on wet floors.  Place frequently used items in easy-to-reach places.  If you need to reach for something above you, use a sturdy step stool that has a grab bar.  Keep electrical cables out of the way.  Do not use floor polish or wax that makes floors slippery. If you have to use wax, make sure that it is non-skid floor wax.  Remove throw rugs and other tripping hazards from the floor. WHAT CAN I DO IN THE STAIRWAYS?  Do not leave any items on the stairs.  Make sure that there are handrails on both sides of the stairs. Fix handrails that are broken or loose. Make sure that handrails are as long as the stairways.  Check any carpeting to make sure that it is firmly attached to the stairs. Fix any carpet that is loose or worn.  Avoid having throw rugs at the top or bottom of stairways, or secure the rugs with carpet tape to prevent them from moving.  Make sure that you have a light switch at the top of the stairs and the bottom of the stairs. If you do not have them, have them installed. WHAT ARE SOME OTHER FALL PREVENTION TIPS?  Wear closed-toe shoes  that fit well and support your feet. Wear shoes that have rubber soles or low heels.  When you use a stepladder, make sure that it is completely opened and that the sides are firmly locked. Have someone hold the ladder while you are using it. Do not climb a closed stepladder.  Add color or contrast paint or tape to grab bars and handrails in your home. Place contrasting color strips on the first and last steps.  Use mobility aids as needed, such as canes, walkers, scooters, and crutches.  Turn on lights if it is dark. Replace any light bulbs that burn out.  Set up furniture so that there are clear paths. Keep the furniture in the same spot.  Fix any uneven floor  surfaces.  Choose a carpet design that does not hide the edge of steps of a stairway.  Be aware of any and all pets.  Review your medicines with your healthcare provider. Some medicines can cause dizziness or changes in blood pressure, which increase your risk of falling. Talk with your health care provider about other ways that you can decrease your risk of falls. This may include working with a physical therapist or trainer to improve your strength, balance, and endurance.   This information is not intended to replace advice given to you by your health care provider. Make sure you discuss any questions you have with your health care provider.   Document Released: 10/07/2002 Document Revised: 03/03/2015 Document Reviewed: 11/21/2014 Elsevier Interactive Patient Education 2016 Elsevier Inc.    Acetaminophen; Hydrocodone tablets or capsules What is this medicine? ACETAMINOPHEN; HYDROCODONE (a set a MEE noe fen; hye droe KOE done) is a pain reliever. It is used to treat moderate to severe pain. This medicine may be used for other purposes; ask your health care provider or pharmacist if you have questions. What should I tell my health care provider before I take this medicine? They need to know if you have any of these conditions: -brain tumor -Crohn's disease, inflammatory bowel disease, or ulcerative colitis -drug abuse or addiction -head injury -heart or circulation problems -if you often drink alcohol -kidney disease or problems going to the bathroom -liver disease -lung disease, asthma, or breathing problems -an unusual or allergic reaction to acetaminophen, hydrocodone, other opioid analgesics, other medicines, foods, dyes, or preservatives -pregnant or trying to get pregnant -breast-feeding How should I use this medicine? Take this medicine by mouth. Swallow it with a full glass of water. Follow the directions on the prescription label. If the medicine upsets your stomach,  take the medicine with food or milk. Do not take more than you are told to take. Talk to your pediatrician regarding the use of this medicine in children. This medicine is not approved for use in children. Patients over 65 years may have a stronger reaction and need a smaller dose. Overdosage: If you think you have taken too much of this medicine contact a poison control center or emergency room at once. NOTE: This medicine is only for you. Do not share this medicine with others. What if I miss a dose? If you miss a dose, take it as soon as you can. If it is almost time for your next dose, take only that dose. Do not take double or extra doses. What may interact with this medicine? -alcohol -antihistamines -isoniazid -medicines for depression, anxiety, or psychotic disturbances -medicines for sleep -muscle relaxants -naltrexone -narcotic medicines (opiates) for pain -phenobarbital -ritonavir -tramadol This list may not describe  all possible interactions. Give your health care provider a list of all the medicines, herbs, non-prescription drugs, or dietary supplements you use. Also tell them if you smoke, drink alcohol, or use illegal drugs. Some items may interact with your medicine. What should I watch for while using this medicine? Tell your doctor or health care professional if your pain does not go away, if it gets worse, or if you have new or a different type of pain. You may develop tolerance to the medicine. Tolerance means that you will need a higher dose of the medicine for pain relief. Tolerance is normal and is expected if you take the medicine for a long time. Do not suddenly stop taking your medicine because you may develop a severe reaction. Your body becomes used to the medicine. This does NOT mean you are addicted. Addiction is a behavior related to getting and using a drug for a non-medical reason. If you have pain, you have a medical reason to take pain medicine. Your doctor  will tell you how much medicine to take. If your doctor wants you to stop the medicine, the dose will be slowly lowered over time to avoid any side effects. You may get drowsy or dizzy when you first start taking the medicine or change doses. Do not drive, use machinery, or do anything that may be dangerous until you know how the medicine affects you. Stand or sit up slowly. There are different types of narcotic medicines (opiates) for pain. If you take more than one type at the same time, you may have more side effects. Give your health care provider a list of all medicines you use. Your doctor will tell you how much medicine to take. Do not take more medicine than directed. Call emergency for help if you have problems breathing. The medicine will cause constipation. Try to have a bowel movement at least every 2 to 3 days. If you do not have a bowel movement for 3 days, call your doctor or health care professional. Too much acetaminophen can be very dangerous. Do not take Tylenol (acetaminophen) or medicines that contain acetaminophen with this medicine. Many non-prescription medicines contain acetaminophen. Always read the labels carefully. What side effects may I notice from receiving this medicine? Side effects that you should report to your doctor or health care professional as soon as possible: -allergic reactions like skin rash, itching or hives, swelling of the face, lips, or tongue -breathing problems -confusion -feeling faint or lightheaded, falls -stomach pain -yellowing of the eyes or skin Side effects that usually do not require medical attention (report to your doctor or health care professional if they continue or are bothersome): -nausea, vomiting -stomach upset This list may not describe all possible side effects. Call your doctor for medical advice about side effects. You may report side effects to FDA at 1-800-FDA-1088. Where should I keep my medicine? Keep out of the reach of  children. This medicine can be abused. Keep your medicine in a safe place to protect it from theft. Do not share this medicine with anyone. Selling or giving away this medicine is dangerous and against the law. This medicine may cause accidental overdose and death if it taken by other adults, children, or pets. Mix any unused medicine with a substance like cat litter or coffee grounds. Then throw the medicine away in a sealed container like a sealed bag or a coffee can with a lid. Do not use the medicine after the expiration date. Store at  room temperature between 15 and 30 degrees C (59 and 86 degrees F). NOTE: This sheet is a summary. It may not cover all possible information. If you have questions about this medicine, talk to your doctor, pharmacist, or health care provider.    2016, Elsevier/Gold Standard. (2014-09-17 15:29:20)

## 2015-12-02 ENCOUNTER — Telehealth: Payer: Self-pay | Admitting: Internal Medicine

## 2015-12-02 MED ORDER — TIOTROPIUM BROMIDE MONOHYDRATE 2.5 MCG/ACT IN AERS: INHALATION_SPRAY | RESPIRATORY_TRACT | Status: DC

## 2015-12-02 NOTE — Telephone Encounter (Signed)
Called and spoke to pt. Pt is requesting refill for Spiriva. Rx sent to preferred pharmacy. Pt verbalized understanding and denied any further questions or concerns at this time.

## 2015-12-03 DIAGNOSIS — J438 Other emphysema: Secondary | ICD-10-CM | POA: Diagnosis not present

## 2015-12-03 DIAGNOSIS — J449 Chronic obstructive pulmonary disease, unspecified: Secondary | ICD-10-CM | POA: Diagnosis not present

## 2015-12-03 DIAGNOSIS — R0602 Shortness of breath: Secondary | ICD-10-CM | POA: Diagnosis not present

## 2015-12-07 ENCOUNTER — Inpatient Hospital Stay (HOSPITAL_COMMUNITY)
Admission: EM | Admit: 2015-12-07 | Discharge: 2015-12-09 | DRG: 190 | Disposition: A | Discharge status: Home / Self Care | Payer: Commercial Managed Care - HMO | Attending: Internal Medicine | Admitting: Internal Medicine

## 2015-12-07 ENCOUNTER — Encounter (HOSPITAL_COMMUNITY): Payer: Self-pay | Admitting: Emergency Medicine

## 2015-12-07 DIAGNOSIS — D72829 Elevated white blood cell count, unspecified: Secondary | ICD-10-CM | POA: Diagnosis present

## 2015-12-07 DIAGNOSIS — R000 Tachycardia, unspecified: Secondary | ICD-10-CM | POA: Diagnosis present

## 2015-12-07 DIAGNOSIS — I10 Essential (primary) hypertension: Secondary | ICD-10-CM | POA: Diagnosis present

## 2015-12-07 DIAGNOSIS — F419 Anxiety disorder, unspecified: Secondary | ICD-10-CM | POA: Diagnosis present

## 2015-12-07 DIAGNOSIS — N179 Acute kidney failure, unspecified: Secondary | ICD-10-CM | POA: Diagnosis present

## 2015-12-07 DIAGNOSIS — G9340 Encephalopathy, unspecified: Secondary | ICD-10-CM | POA: Diagnosis present

## 2015-12-07 DIAGNOSIS — G92 Toxic encephalopathy: Secondary | ICD-10-CM | POA: Diagnosis not present

## 2015-12-07 DIAGNOSIS — R778 Other specified abnormalities of plasma proteins: Secondary | ICD-10-CM

## 2015-12-07 DIAGNOSIS — I4581 Long QT syndrome: Secondary | ICD-10-CM | POA: Diagnosis not present

## 2015-12-07 DIAGNOSIS — F329 Major depressive disorder, single episode, unspecified: Secondary | ICD-10-CM | POA: Diagnosis present

## 2015-12-07 DIAGNOSIS — A419 Sepsis, unspecified organism: Secondary | ICD-10-CM | POA: Diagnosis not present

## 2015-12-07 DIAGNOSIS — T400X1A Poisoning by opium, accidental (unintentional), initial encounter: Secondary | ICD-10-CM | POA: Diagnosis not present

## 2015-12-07 DIAGNOSIS — M549 Dorsalgia, unspecified: Secondary | ICD-10-CM | POA: Diagnosis present

## 2015-12-07 DIAGNOSIS — I959 Hypotension, unspecified: Secondary | ICD-10-CM | POA: Diagnosis not present

## 2015-12-07 DIAGNOSIS — R7989 Other specified abnormal findings of blood chemistry: Secondary | ICD-10-CM

## 2015-12-07 DIAGNOSIS — Z9981 Dependence on supplemental oxygen: Secondary | ICD-10-CM | POA: Diagnosis not present

## 2015-12-07 DIAGNOSIS — F17210 Nicotine dependence, cigarettes, uncomplicated: Secondary | ICD-10-CM | POA: Diagnosis present

## 2015-12-07 DIAGNOSIS — G8929 Other chronic pain: Secondary | ICD-10-CM | POA: Diagnosis present

## 2015-12-07 DIAGNOSIS — Z8249 Family history of ischemic heart disease and other diseases of the circulatory system: Secondary | ICD-10-CM | POA: Diagnosis not present

## 2015-12-07 DIAGNOSIS — T6594XA Toxic effect of unspecified substance, undetermined, initial encounter: Secondary | ICD-10-CM | POA: Diagnosis present

## 2015-12-07 DIAGNOSIS — I509 Heart failure, unspecified: Secondary | ICD-10-CM | POA: Diagnosis not present

## 2015-12-07 DIAGNOSIS — J9610 Chronic respiratory failure, unspecified whether with hypoxia or hypercapnia: Secondary | ICD-10-CM | POA: Diagnosis present

## 2015-12-07 DIAGNOSIS — J45909 Unspecified asthma, uncomplicated: Secondary | ICD-10-CM | POA: Diagnosis present

## 2015-12-07 DIAGNOSIS — T40601A Poisoning by unspecified narcotics, accidental (unintentional), initial encounter: Secondary | ICD-10-CM

## 2015-12-07 DIAGNOSIS — R1110 Vomiting, unspecified: Secondary | ICD-10-CM | POA: Diagnosis not present

## 2015-12-07 DIAGNOSIS — J441 Chronic obstructive pulmonary disease with (acute) exacerbation: Secondary | ICD-10-CM | POA: Diagnosis not present

## 2015-12-07 DIAGNOSIS — K219 Gastro-esophageal reflux disease without esophagitis: Secondary | ICD-10-CM | POA: Diagnosis present

## 2015-12-07 DIAGNOSIS — T50904A Poisoning by unspecified drugs, medicaments and biological substances, undetermined, initial encounter: Secondary | ICD-10-CM | POA: Diagnosis not present

## 2015-12-07 DIAGNOSIS — Z825 Family history of asthma and other chronic lower respiratory diseases: Secondary | ICD-10-CM

## 2015-12-07 DIAGNOSIS — Z9114 Patient's other noncompliance with medication regimen: Secondary | ICD-10-CM

## 2015-12-07 DIAGNOSIS — T3995XA Adverse effect of unspecified nonopioid analgesic, antipyretic and antirheumatic, initial encounter: Secondary | ICD-10-CM | POA: Diagnosis present

## 2015-12-07 DIAGNOSIS — F319 Bipolar disorder, unspecified: Secondary | ICD-10-CM | POA: Diagnosis not present

## 2015-12-07 DIAGNOSIS — I248 Other forms of acute ischemic heart disease: Secondary | ICD-10-CM | POA: Diagnosis present

## 2015-12-07 DIAGNOSIS — J449 Chronic obstructive pulmonary disease, unspecified: Secondary | ICD-10-CM

## 2015-12-07 LAB — CBG MONITORING, ED: GLUCOSE-CAPILLARY: 150 mg/dL — AB (ref 65–99)

## 2015-12-07 MED ORDER — IPRATROPIUM-ALBUTEROL 0.5-2.5 (3) MG/3ML IN SOLN
3.0000 mL | RESPIRATORY_TRACT | Status: DC
  Administered 2015-12-08: 3 mL via RESPIRATORY_TRACT
  Filled 2015-12-07: qty 3

## 2015-12-07 MED ORDER — NALOXONE HCL 0.4 MG/ML IJ SOLN
0.4000 mg | Freq: Once | INTRAMUSCULAR | Status: AC
  Administered 2015-12-07: 0.4 mg via INTRAVENOUS
  Filled 2015-12-07: qty 1

## 2015-12-07 MED ORDER — NALOXONE HCL 2 MG/2ML IJ SOSY
2.0000 mg | PREFILLED_SYRINGE | Freq: Once | INTRAMUSCULAR | Status: AC
  Administered 2015-12-08: 2 mg via INTRAVENOUS
  Filled 2015-12-07: qty 2

## 2015-12-07 NOTE — ED Notes (Signed)
Pt BIB [facility] for respiratory depression and unresponsiveness; per EMS, pt was breathing 4 times/min and had dilated pupils; EMS gave  narcan and pt became responsive and respiratory rate increased to 20 r/min; pt appears drowsy but is alert to self and situation; pt vomited upon entry into room. Pt admits to snorting heroin earlier this evening for the first time.

## 2015-12-07 NOTE — ED Notes (Signed)
Bed: ZO10 Expected date:  Expected time:  Means of arrival:  Comments: EMS 60 yo male snorted heroin/unknown amount-narcan 2 mg IV-now alert

## 2015-12-07 NOTE — ED Provider Notes (Signed)
[name]: [id number]     Arrival date & time 12/07/15  2319 History   First MD Initiated Contact with Patient 12/07/15 2331     Chief Complaint  Patient presents with  . Drug Overdose     (Consider location/radiation/quality/duration/timing/severity/associated sxs/prior Treatment) HPI Patient reportedly snorted heroin tonight. He was found by EMS with respiratory depression unresponsive. Reportedly he is breathing 4 times per minute. With 2 mg of Narcan patient's respiratory rate increased to 20 and he became alert to person and situation. He vomited upon arrival to the emergency department. He is denying any localizing pain at this time. He reports he feels nauseated and he feels bad. He reports he does have COPD and smokes. At this time he is denying shortness of breath or chest pain.  Family has now arrived. Family reports there is no way he could've snorted heroin. It is possible that he took an Opana in conjunction with his benzos and overdosed unintentionally that way. He apparently had been on chronic pain control and has not had his prescription for a while. There is someone in the neighborhood who sometimes sells Opana. Family also reports that he has had a bad lung infection this week but has not been on any antibiotics for it, they report that he's been coughing a lot and was supposed to see his doctor but is not gone yet. Past Medical History  Diagnosis Date  . COPD (chronic obstructive pulmonary disease) (HCC)   . Hiatal hernia   . Emphysema   . Emphysema   . Bipolar disorder (HCC)   . Hypertension   . Shortness of breath   . GERD (gastroesophageal reflux disease)   . Headache(784.0)   . Arthritis   . Depression   . Emphysema of lung (HCC)   . Anxiety   . Asthma    Past Surgical History  Procedure Laterality Date  . Artheroscopic knee surgery r Right 01/2013   Family History  Problem Relation Age of Onset  . Heart disease Father   . Asthma Sister    Social History   Substance Use Topics  . Smoking status: Current Every Day Smoker -- 1.00 packs/day for 48 years    Types: Cigarettes  . Smokeless tobacco: Never Used     Comment: 2 cigs daily//09/10/15  . Alcohol Use: No    Review of Systems  10 Systems reviewed and are negative for acute change except as noted in the HPI. Review of systems appears unreliable this time due to patient condition.  Allergies  Codeine  Home Medications   Prior to Admission medications   Medication Sig Start Date End Date Taking? Authorizing Provider  albuterol (PROVENTIL HFA;VENTOLIN HFA) 108 (90 BASE) MCG/ACT inhaler Inhale 2 puffs into the lungs every 4 (four) hours as needed. For shortness of breath   Yes Historical Provider, MD  albuterol (PROVENTIL) (2.5 MG/3ML) 0.083% nebulizer solution Take 2.5 mg by nebulization every 6 (six) hours as needed. For shortness of breath   Yes Historical Provider, MD  ALPRAZolam Prudy Feeler) 1 MG tablet Take 1 mg by mouth 3 (three) times daily as needed for anxiety.    Yes Historical Provider, MD  amLODipine (NORVASC) 5 MG tablet Take 5 mg by mouth daily. 10/28/15  Yes Historical Provider, MD  atenolol-chlorthalidone (TENORETIC) 100-25 MG per tablet Take 1 tablet by mouth daily.    Yes Historical Provider, MD  budesonide-formoterol (SYMBICORT) 160-4.5 MCG/ACT inhaler Inhale 2 puffs into the lungs 2 (two) times daily.   Yes  Historical Provider, MD  lithium carbonate 300 MG capsule Take 300 mg by mouth 3 (three) times daily with meals.   Yes Historical Provider, MD  Multiple Vitamin (MULTIVITAMIN WITH MINERALS) TABS tablet Take 1 tablet by mouth daily.   Yes Historical Provider, MD  Omega-3 Fatty Acids (FISH OIL) 500 MG CAPS Take 500 mg by mouth daily.   Yes Historical Provider, MD  omeprazole (PRILOSEC) 20 MG capsule Take 20 mg by mouth daily as needed (for acid reflux/indigestion).   Yes Historical Provider, MD  Tiotropium Bromide Monohydrate (SPIRIVA RESPIMAT) 2.5 MCG/ACT AERS 2 puffs  each morning 12/02/15  Yes Kalman Shan, MD  cyclobenzaprine (FLEXERIL) 5 MG tablet Take 1 tablet (5 mg total) by mouth at bedtime as needed. 11/27/15   Thao P Le, DO  nystatin (MYCOSTATIN) 100000 UNIT/ML suspension Take 5 mLs (500,000 Units total) by mouth 4 (four) times daily. Patient not taking: Reported on 12/08/2015 09/10/15   Kalman Shan, MD  oxyCODONE-acetaminophen (PERCOCET/ROXICET) 5-325 MG tablet Take 1 tablet by mouth every 8 (eight) hours as needed for severe pain. Do not take with tylenol, take with stool softener Patient not taking: Reported on 12/08/2015 11/30/15   Wallis Bamberg, PA-C   BP 92/47 mmHg  Pulse 111  Temp(Src) 97.4 F (36.3 C) (Oral)  Resp 19  SpO2 95% Physical Exam  Constitutional:  Patient is somnolent and poorly responsive. Color is good. Inspirations are deep and regular. Patient smells of tobacco smoke.  HENT:  Head: Normocephalic and atraumatic.  Right Ear: External ear normal.  Left Ear: External ear normal.  Mouth/Throat: Oropharynx is clear and moist.  Eyes:  Pupils are constricted and symmetric.  Cardiovascular:  Tachycardia no gross rub murmur gallop.  Pulmonary/Chest:  Inspirations are regular and deep. No gross wheeze rhonchi or rail. Diminished breath sounds at the bases.  Abdominal: Soft. Bowel sounds are normal. He exhibits no distension. There is no tenderness.  Musculoskeletal:  No obvious extremity deformity. Patient has multiple tattoos and bruises. Trace peripheral edema.  Neurological:  Patient is somnolent. After administration of Narcan he is answering questions and oriented fashion. He does not seem to have focal deficit. Patient is spontaneously moving in the stretcher to roll over in reposition.  Skin: Skin is warm and dry.  Many ecchymoses and small abrasions of various ages.  Psychiatric:  Patient is somnolent    ED Course  Procedures (including critical care time) CRITICAL CARE Performed by: Arby Barrette   Total  critical care time: 45 minutes  Critical care time was exclusive of separately billable procedures and treating other patients.  Critical care was necessary to treat or prevent imminent or life-threatening deterioration.  Critical care was time spent personally by me on the following activities: development of treatment plan with patient and/or surrogate as well as nursing, discussions with consultants, evaluation of patient's response to treatment, examination of patient, obtaining history from patient or surrogate, ordering and performing treatments and interventions, ordering and review of laboratory studies, ordering and review of radiographic studies, pulse oximetry and re-evaluation of patient's condition. Labs Review Labs Reviewed  COMPREHENSIVE METABOLIC PANEL - Abnormal; Notable for the following:    Glucose, Bld 138 (*)    BUN 27 (*)    Creatinine, Ser 1.74 (*)    Calcium 8.7 (*)    AST 362 (*)    ALT 341 (*)    GFR calc non Af Amer 41 (*)    GFR calc Af Amer 48 (*)    All  other components within normal limits  ACETAMINOPHEN LEVEL - Abnormal; Notable for the following:    Acetaminophen (Tylenol), Serum <10 (*)    All other components within normal limits  CBC - Abnormal; Notable for the following:    WBC 34.9 (*)    Hemoglobin 17.7 (*)    HCT 53.3 (*)    RDW 15.7 (*)    All other components within normal limits  CBG MONITORING, ED - Abnormal; Notable for the following:    Glucose-Capillary 150 (*)    All other components within normal limits  CULTURE, BLOOD (ROUTINE X 2)  CULTURE, BLOOD (ROUTINE X 2)  URINE CULTURE  ETHANOL  SALICYLATE LEVEL  URINE RAPID DRUG SCREEN, HOSP PERFORMED  URINALYSIS, ROUTINE W REFLEX MICROSCOPIC (NOT AT [facility])  BLOOD GAS, ARTERIAL  I-STAT CG4 LACTIC ACID, ED    Imaging Review Dg Chest Port 1 View  12/08/2015  CLINICAL DATA:  Acute onset of vomiting. Status post overdose. Initial encounter. EXAM: PORTABLE CHEST 1 VIEW COMPARISON:  Chest  radiograph performed 09/10/2015, and CT of the chest performed 11/03/2015 FINDINGS: The lungs are well-aerated. Mild vascular congestion is noted. Mild peribronchial thickening is seen. There is no evidence of focal opacification, pleural effusion or pneumothorax. The cardiomediastinal silhouette is within normal limits. No acute osseous abnormalities are seen. IMPRESSION: Mild vascular congestion noted.  Mild peribronchial thickening seen. Electronically Signed   By: Roanna Raider M.D.   On: 12/08/2015 01:39   I have personally reviewed and evaluated these images and lab results as part of my medical decision-making.   EKG Interpretation   Date/Time:  Monday December 07 2015 23:33:32 EST Ventricular Rate:  115 PR Interval:  125 QRS Duration: 86 QT Interval:  347 QTC Calculation: 480 R Axis:   52 Text Interpretation:  Sinus tachycardia Probable left atrial enlargement  RSR' in V1 or V2, probably normal variant Borderline prolonged QT interval  no change from previous Confirmed by Donnald Garre, MD, Lebron Conners [phone]) on  12/07/2015 11:45:23 PM     With recheck, the patient continues to awaken to light stimulus. He answers simple questions.  Consult:(01:55) Patient's case was reviewed Dr. Garvin Fila, he requests ABG prior to admission to determine if patient should be in ICU. MDM   Final diagnoses:  Chronic obstructive pulmonary disease, unspecified COPD type (HCC)  Narcotic overdose, accidental or unintentional, initial encounter  Sepsis, due to unspecified organism [facility])   Plan will be to obtain ABG and recontact Dr. Garvin Fila  for admission. Patient continues to get antibiotics and fluids and continue monitoring. He will also have Solu-Medrol and Atrovent for COPD.    Arby Barrette, MD 12/08/15 [phone]

## 2015-12-07 NOTE — ED Notes (Signed)
MD at bedside. 

## 2015-12-08 ENCOUNTER — Observation Stay (HOSPITAL_COMMUNITY): Payer: Commercial Managed Care - HMO

## 2015-12-08 ENCOUNTER — Emergency Department (HOSPITAL_COMMUNITY): Payer: Commercial Managed Care - HMO

## 2015-12-08 ENCOUNTER — Encounter (HOSPITAL_COMMUNITY): Payer: Self-pay | Admitting: Internal Medicine

## 2015-12-08 DIAGNOSIS — G8929 Other chronic pain: Secondary | ICD-10-CM | POA: Diagnosis present

## 2015-12-08 DIAGNOSIS — Z9114 Patient's other noncompliance with medication regimen: Secondary | ICD-10-CM | POA: Diagnosis not present

## 2015-12-08 DIAGNOSIS — Z9981 Dependence on supplemental oxygen: Secondary | ICD-10-CM | POA: Diagnosis not present

## 2015-12-08 DIAGNOSIS — M549 Dorsalgia, unspecified: Secondary | ICD-10-CM | POA: Diagnosis present

## 2015-12-08 DIAGNOSIS — R7989 Other specified abnormal findings of blood chemistry: Secondary | ICD-10-CM | POA: Diagnosis present

## 2015-12-08 DIAGNOSIS — G92 Toxic encephalopathy: Secondary | ICD-10-CM | POA: Diagnosis present

## 2015-12-08 DIAGNOSIS — I4581 Long QT syndrome: Secondary | ICD-10-CM | POA: Diagnosis present

## 2015-12-08 DIAGNOSIS — A419 Sepsis, unspecified organism: Secondary | ICD-10-CM | POA: Diagnosis present

## 2015-12-08 DIAGNOSIS — Z8249 Family history of ischemic heart disease and other diseases of the circulatory system: Secondary | ICD-10-CM | POA: Diagnosis not present

## 2015-12-08 DIAGNOSIS — I509 Heart failure, unspecified: Secondary | ICD-10-CM

## 2015-12-08 DIAGNOSIS — F17210 Nicotine dependence, cigarettes, uncomplicated: Secondary | ICD-10-CM | POA: Diagnosis present

## 2015-12-08 DIAGNOSIS — R000 Tachycardia, unspecified: Secondary | ICD-10-CM | POA: Diagnosis present

## 2015-12-08 DIAGNOSIS — G9340 Encephalopathy, unspecified: Secondary | ICD-10-CM | POA: Diagnosis present

## 2015-12-08 DIAGNOSIS — D72829 Elevated white blood cell count, unspecified: Secondary | ICD-10-CM | POA: Diagnosis present

## 2015-12-08 DIAGNOSIS — F419 Anxiety disorder, unspecified: Secondary | ICD-10-CM | POA: Diagnosis present

## 2015-12-08 DIAGNOSIS — N179 Acute kidney failure, unspecified: Secondary | ICD-10-CM | POA: Diagnosis present

## 2015-12-08 DIAGNOSIS — F319 Bipolar disorder, unspecified: Secondary | ICD-10-CM | POA: Diagnosis not present

## 2015-12-08 DIAGNOSIS — T400X1A Poisoning by opium, accidental (unintentional), initial encounter: Secondary | ICD-10-CM | POA: Diagnosis present

## 2015-12-08 DIAGNOSIS — J441 Chronic obstructive pulmonary disease with (acute) exacerbation: Secondary | ICD-10-CM | POA: Diagnosis present

## 2015-12-08 DIAGNOSIS — I959 Hypotension, unspecified: Secondary | ICD-10-CM | POA: Diagnosis present

## 2015-12-08 DIAGNOSIS — R778 Other specified abnormalities of plasma proteins: Secondary | ICD-10-CM

## 2015-12-08 DIAGNOSIS — T3995XA Adverse effect of unspecified nonopioid analgesic, antipyretic and antirheumatic, initial encounter: Secondary | ICD-10-CM | POA: Diagnosis present

## 2015-12-08 DIAGNOSIS — Z825 Family history of asthma and other chronic lower respiratory diseases: Secondary | ICD-10-CM | POA: Diagnosis not present

## 2015-12-08 DIAGNOSIS — I248 Other forms of acute ischemic heart disease: Secondary | ICD-10-CM | POA: Diagnosis present

## 2015-12-08 DIAGNOSIS — K219 Gastro-esophageal reflux disease without esophagitis: Secondary | ICD-10-CM | POA: Diagnosis present

## 2015-12-08 DIAGNOSIS — J45909 Unspecified asthma, uncomplicated: Secondary | ICD-10-CM | POA: Diagnosis present

## 2015-12-08 DIAGNOSIS — F329 Major depressive disorder, single episode, unspecified: Secondary | ICD-10-CM | POA: Diagnosis present

## 2015-12-08 DIAGNOSIS — T6594XA Toxic effect of unspecified substance, undetermined, initial encounter: Secondary | ICD-10-CM | POA: Diagnosis present

## 2015-12-08 DIAGNOSIS — J9610 Chronic respiratory failure, unspecified whether with hypoxia or hypercapnia: Secondary | ICD-10-CM | POA: Diagnosis present

## 2015-12-08 DIAGNOSIS — I10 Essential (primary) hypertension: Secondary | ICD-10-CM | POA: Diagnosis present

## 2015-12-08 LAB — HEPATIC FUNCTION PANEL
ALK PHOS: 59 U/L (ref 38–126)
ALT: 218 U/L — AB (ref 17–63)
AST: 178 U/L — AB (ref 15–41)
Albumin: 3.2 g/dL — ABNORMAL LOW (ref 3.5–5.0)
Bilirubin, Direct: 0.2 mg/dL (ref 0.1–0.5)
Indirect Bilirubin: 0.4 mg/dL (ref 0.3–0.9)
TOTAL PROTEIN: 5.6 g/dL — AB (ref 6.5–8.1)
Total Bilirubin: 0.6 mg/dL (ref 0.3–1.2)

## 2015-12-08 LAB — I-STAT CG4 LACTIC ACID, ED
LACTIC ACID, VENOUS: 1.8 mmol/L (ref 0.5–2.0)
Lactic Acid, Venous: 0.85 mmol/L (ref 0.5–2.0)

## 2015-12-08 LAB — CBC WITH DIFFERENTIAL/PLATELET
Basophils Absolute: 0 10*3/uL (ref 0.0–0.1)
Basophils Relative: 0 %
EOS PCT: 0 %
Eosinophils Absolute: 0 10*3/uL (ref 0.0–0.7)
HCT: 45 % (ref 39.0–52.0)
Hemoglobin: 14.5 g/dL (ref 13.0–17.0)
LYMPHS ABS: 0.7 10*3/uL (ref 0.7–4.0)
LYMPHS PCT: 4 %
MCH: 32.2 pg (ref 26.0–34.0)
MCHC: 32.2 g/dL (ref 30.0–36.0)
MCV: 99.8 fL (ref 78.0–100.0)
MONO ABS: 0.3 10*3/uL (ref 0.1–1.0)
MONOS PCT: 2 %
Neutro Abs: 15.3 10*3/uL — ABNORMAL HIGH (ref 1.7–7.7)
Neutrophils Relative %: 94 %
PLATELETS: 119 10*3/uL — AB (ref 150–400)
RBC: 4.51 MIL/uL (ref 4.22–5.81)
RDW: 15.7 % — ABNORMAL HIGH (ref 11.5–15.5)
WBC: 16.3 10*3/uL — AB (ref 4.0–10.5)

## 2015-12-08 LAB — URINE MICROSCOPIC-ADD ON

## 2015-12-08 LAB — BASIC METABOLIC PANEL
ANION GAP: 6 (ref 5–15)
BUN: 24 mg/dL — ABNORMAL HIGH (ref 6–20)
CHLORIDE: 111 mmol/L (ref 101–111)
CO2: 27 mmol/L (ref 22–32)
Calcium: 7.6 mg/dL — ABNORMAL LOW (ref 8.9–10.3)
Creatinine, Ser: 1.31 mg/dL — ABNORMAL HIGH (ref 0.61–1.24)
GFR calc non Af Amer: 58 mL/min — ABNORMAL LOW (ref >60)
GLUCOSE: 86 mg/dL (ref 65–99)
Potassium: 4 mmol/L (ref 3.5–5.1)
Sodium: 144 mmol/L (ref 135–145)

## 2015-12-08 LAB — BLOOD GAS, ARTERIAL
ACID-BASE DEFICIT: 1 mmol/L (ref 0.0–2.0)
Bicarbonate: 25.1 mEq/L — ABNORMAL HIGH (ref 20.0–24.0)
DRAWN BY: 11249
O2 CONTENT: 4 L/min
O2 Saturation: 91.4 %
PH ART: 7.339 — AB (ref 7.350–7.450)
Patient temperature: 97.4
TCO2: 22.2 mmol/L (ref 0–100)
pCO2 arterial: 47.5 mmHg — ABNORMAL HIGH (ref 35.0–45.0)
pO2, Arterial: 62.7 mmHg — ABNORMAL LOW (ref 80.0–100.0)

## 2015-12-08 LAB — RAPID URINE DRUG SCREEN, HOSP PERFORMED
Amphetamines: NOT DETECTED
BARBITURATES: NOT DETECTED
Benzodiazepines: POSITIVE — AB
COCAINE: NOT DETECTED
Opiates: POSITIVE — AB
TETRAHYDROCANNABINOL: POSITIVE — AB

## 2015-12-08 LAB — LITHIUM LEVEL: Lithium Lvl: <0.06 mmol/L — ABNORMAL LOW (ref 0.60–1.20)

## 2015-12-08 LAB — COMPREHENSIVE METABOLIC PANEL
ALBUMIN: 3.5 g/dL (ref 3.5–5.0)
ALBUMIN: 4.5 g/dL (ref 3.5–5.0)
ALK PHOS: 91 U/L (ref 38–126)
ALT: 234 U/L — AB (ref 17–63)
ALT: 341 U/L — AB (ref 17–63)
ANION GAP: 11 (ref 5–15)
AST: 172 U/L — AB (ref 15–41)
AST: 362 U/L — ABNORMAL HIGH (ref 15–41)
Alkaline Phosphatase: 63 U/L (ref 38–126)
Anion gap: 9 (ref 5–15)
BILIRUBIN TOTAL: 0.8 mg/dL (ref 0.3–1.2)
BUN: 23 mg/dL — AB (ref 6–20)
BUN: 27 mg/dL — ABNORMAL HIGH (ref 6–20)
CALCIUM: 8.7 mg/dL — AB (ref 8.9–10.3)
CHLORIDE: 110 mmol/L (ref 101–111)
CO2: 24 mmol/L (ref 22–32)
CO2: 27 mmol/L (ref 22–32)
CREATININE: 1.32 mg/dL — AB (ref 0.61–1.24)
CREATININE: 1.74 mg/dL — AB (ref 0.61–1.24)
Calcium: 8.2 mg/dL — ABNORMAL LOW (ref 8.9–10.3)
Chloride: 103 mmol/L (ref 101–111)
GFR calc Af Amer: >60 mL/min (ref >60)
GFR calc non Af Amer: 41 mL/min — ABNORMAL LOW (ref >60)
GFR calc non Af Amer: 57 mL/min — ABNORMAL LOW (ref >60)
GFR, EST AFRICAN AMERICAN: 48 mL/min — AB (ref >60)
GLUCOSE: 104 mg/dL — AB (ref 65–99)
GLUCOSE: 138 mg/dL — AB (ref 65–99)
POTASSIUM: 4.3 mmol/L (ref 3.5–5.1)
Potassium: 4.4 mmol/L (ref 3.5–5.1)
SODIUM: 143 mmol/L (ref 135–145)
Sodium: 141 mmol/L (ref 135–145)
TOTAL PROTEIN: 8.1 g/dL (ref 6.5–8.1)
Total Bilirubin: 0.9 mg/dL (ref 0.3–1.2)
Total Protein: 6.2 g/dL — ABNORMAL LOW (ref 6.5–8.1)

## 2015-12-08 LAB — SALICYLATE LEVEL: Salicylate Lvl: <4 mg/dL (ref 2.8–30.0)

## 2015-12-08 LAB — LACTIC ACID, PLASMA: Lactic Acid, Venous: 0.8 mmol/L (ref 0.5–2.0)

## 2015-12-08 LAB — CBC
HEMATOCRIT: 53.3 % — AB (ref 39.0–52.0)
HEMOGLOBIN: 17.7 g/dL — AB (ref 13.0–17.0)
MCH: 33.1 pg (ref 26.0–34.0)
MCHC: 33.2 g/dL (ref 30.0–36.0)
MCV: 99.8 fL (ref 78.0–100.0)
Platelets: 162 10*3/uL (ref 150–400)
RBC: 5.34 MIL/uL (ref 4.22–5.81)
RDW: 15.7 % — AB (ref 11.5–15.5)
WBC: 34.9 10*3/uL — ABNORMAL HIGH (ref 4.0–10.5)

## 2015-12-08 LAB — URINALYSIS, ROUTINE W REFLEX MICROSCOPIC
BILIRUBIN URINE: NEGATIVE
Glucose, UA: 250 mg/dL — AB
Ketones, ur: NEGATIVE mg/dL
Leukocytes, UA: NEGATIVE
Nitrite: NEGATIVE
PH: 5 (ref 5.0–8.0)
Protein, ur: 30 mg/dL — AB
SPECIFIC GRAVITY, URINE: 1.015 (ref 1.005–1.030)

## 2015-12-08 LAB — PROTIME-INR
INR: 1.08 (ref 0.00–1.49)
Prothrombin Time: 14.2 seconds (ref 11.6–15.2)

## 2015-12-08 LAB — ACETAMINOPHEN LEVEL

## 2015-12-08 LAB — TROPONIN I
TROPONIN I: 0.21 ng/mL — AB (ref <0.031)
TROPONIN I: 0.13 ng/mL — AB (ref <0.031)
Troponin I: 0.34 ng/mL — ABNORMAL HIGH (ref <0.031)

## 2015-12-08 LAB — ETHANOL: Alcohol, Ethyl (B): <5 mg/dL (ref <5)

## 2015-12-08 MED ORDER — IPRATROPIUM-ALBUTEROL 0.5-2.5 (3) MG/3ML IN SOLN
3.0000 mL | RESPIRATORY_TRACT | Status: DC
  Filled 2015-12-08: qty 3

## 2015-12-08 MED ORDER — PIPERACILLIN-TAZOBACTAM 3.375 G IVPB: 3.3750 g | Freq: Three times a day (TID) | INTRAVENOUS | Status: DC

## 2015-12-08 MED ORDER — ASPIRIN 81 MG PO CHEW
324.0000 mg | CHEWABLE_TABLET | Freq: Every day | ORAL | Status: DC
  Administered 2015-12-08 – 2015-12-09 (×2): 324 mg via ORAL
  Filled 2015-12-08 (×2): qty 4

## 2015-12-08 MED ORDER — OMEGA-3-ACID ETHYL ESTERS 1 G PO CAPS
1000.0000 mg | ORAL_CAPSULE | Freq: Every day | ORAL | Status: DC
  Administered 2015-12-08 – 2015-12-09 (×2): 1000 mg via ORAL
  Filled 2015-12-08 (×2): qty 1

## 2015-12-08 MED ORDER — IPRATROPIUM-ALBUTEROL 0.5-2.5 (3) MG/3ML IN SOLN: 3.0000 mL | RESPIRATORY_TRACT | Status: DC

## 2015-12-08 MED ORDER — METHYLPREDNISOLONE SODIUM SUCC 40 MG IJ SOLR
40.0000 mg | Freq: Two times a day (BID) | INTRAMUSCULAR | Status: DC
  Administered 2015-12-08 – 2015-12-09 (×3): 40 mg via INTRAVENOUS
  Filled 2015-12-08 (×3): qty 1

## 2015-12-08 MED ORDER — ONDANSETRON HCL 4 MG PO TABS: 4.0000 mg | ORAL_TABLET | Freq: Four times a day (QID) | ORAL | Status: DC | PRN

## 2015-12-08 MED ORDER — IPRATROPIUM BROMIDE 0.02 % IN SOLN: 0.5000 mg | RESPIRATORY_TRACT | Status: DC

## 2015-12-08 MED ORDER — LITHIUM CARBONATE 300 MG PO CAPS
300.0000 mg | ORAL_CAPSULE | Freq: Three times a day (TID) | ORAL | Status: DC
  Administered 2015-12-08 – 2015-12-09 (×4): 300 mg via ORAL
  Filled 2015-12-08 (×6): qty 1

## 2015-12-08 MED ORDER — BUDESONIDE 0.25 MG/2ML IN SUSP
0.2500 mg | Freq: Two times a day (BID) | RESPIRATORY_TRACT | Status: DC
  Administered 2015-12-08: 0.25 mg via RESPIRATORY_TRACT
  Filled 2015-12-08 (×3): qty 2

## 2015-12-08 MED ORDER — AZITHROMYCIN 500 MG IV SOLR
500.0000 mg | INTRAVENOUS | Status: DC
  Administered 2015-12-08 – 2015-12-09 (×2): 500 mg via INTRAVENOUS
  Filled 2015-12-08 (×2): qty 500

## 2015-12-08 MED ORDER — SODIUM CHLORIDE 0.9 % IV BOLUS (SEPSIS)
30.0000 mL/kg | Freq: Once | INTRAVENOUS | Status: AC
  Administered 2015-12-08: 2313 mL via INTRAVENOUS

## 2015-12-08 MED ORDER — ALBUTEROL SULFATE (2.5 MG/3ML) 0.083% IN NEBU: 2.5000 mg | INHALATION_SOLUTION | RESPIRATORY_TRACT | Status: DC | PRN

## 2015-12-08 MED ORDER — ALBUTEROL SULFATE (2.5 MG/3ML) 0.083% IN NEBU: 2.5000 mg | INHALATION_SOLUTION | RESPIRATORY_TRACT | Status: DC

## 2015-12-08 MED ORDER — VANCOMYCIN HCL IN DEXTROSE 750-5 MG/150ML-% IV SOLN: 750.0000 mg | Freq: Two times a day (BID) | INTRAVENOUS | Status: DC

## 2015-12-08 MED ORDER — SODIUM CHLORIDE 0.9 % IV SOLN
INTRAVENOUS | Status: DC
  Administered 2015-12-08: 06:00:00 via INTRAVENOUS

## 2015-12-08 MED ORDER — CETYLPYRIDINIUM CHLORIDE 0.05 % MT LIQD
7.0000 mL | Freq: Two times a day (BID) | OROMUCOSAL | Status: DC
  Administered 2015-12-08 (×2): 7 mL via OROMUCOSAL

## 2015-12-08 MED ORDER — ACETAMINOPHEN 650 MG RE SUPP: 650.0000 mg | Freq: Four times a day (QID) | RECTAL | Status: DC | PRN

## 2015-12-08 MED ORDER — SODIUM CHLORIDE 0.9 % IV SOLN
Freq: Once | INTRAVENOUS | Status: AC
  Administered 2015-12-08: 05:00:00 via INTRAVENOUS

## 2015-12-08 MED ORDER — VANCOMYCIN HCL IN DEXTROSE 1-5 GM/200ML-% IV SOLN
1000.0000 mg | Freq: Once | INTRAVENOUS | Status: AC
  Administered 2015-12-08: 1000 mg via INTRAVENOUS
  Filled 2015-12-08: qty 200

## 2015-12-08 MED ORDER — METHYLPREDNISOLONE SODIUM SUCC 125 MG IJ SOLR
125.0000 mg | Freq: Once | INTRAMUSCULAR | Status: AC
  Administered 2015-12-08: 125 mg via INTRAVENOUS
  Filled 2015-12-08: qty 2

## 2015-12-08 MED ORDER — PIPERACILLIN-TAZOBACTAM 3.375 G IVPB 30 MIN
3.3750 g | Freq: Once | INTRAVENOUS | Status: AC
  Administered 2015-12-08: 3.375 g via INTRAVENOUS
  Filled 2015-12-08: qty 50

## 2015-12-08 MED ORDER — IPRATROPIUM-ALBUTEROL 0.5-2.5 (3) MG/3ML IN SOLN
3.0000 mL | Freq: Four times a day (QID) | RESPIRATORY_TRACT | Status: DC
  Administered 2015-12-08 (×2): 3 mL via RESPIRATORY_TRACT
  Filled 2015-12-08 (×3): qty 3

## 2015-12-08 MED ORDER — IPRATROPIUM-ALBUTEROL 0.5-2.5 (3) MG/3ML IN SOLN: 3.0000 mL | RESPIRATORY_TRACT | Status: DC | PRN

## 2015-12-08 MED ORDER — ONDANSETRON HCL 4 MG/2ML IJ SOLN: 4.0000 mg | Freq: Four times a day (QID) | INTRAMUSCULAR | Status: DC | PRN

## 2015-12-08 MED ORDER — PANTOPRAZOLE SODIUM 40 MG PO TBEC
40.0000 mg | DELAYED_RELEASE_TABLET | Freq: Every day | ORAL | Status: DC
  Administered 2015-12-08 – 2015-12-09 (×2): 40 mg via ORAL
  Filled 2015-12-08 (×2): qty 1

## 2015-12-08 MED ORDER — ACETAMINOPHEN 325 MG PO TABS: 650.0000 mg | ORAL_TABLET | Freq: Four times a day (QID) | ORAL | Status: DC | PRN

## 2015-12-08 MED ORDER — ALPRAZOLAM 1 MG PO TABS
1.0000 mg | ORAL_TABLET | Freq: Three times a day (TID) | ORAL | Status: DC | PRN
  Administered 2015-12-08 – 2015-12-09 (×3): 1 mg via ORAL
  Filled 2015-12-08 (×3): qty 1

## 2015-12-08 NOTE — ED Notes (Signed)
Two unsuccessful IV attempts by this nurse

## 2015-12-08 NOTE — H&P (Addendum)
[facility] History and Physical  Glen Lopez:[id number] DOB: 1955-11-11 DOA: 12/07/2015  Referring physician: Dr. Clarice Pole. PCP: Leanor Rubenstein, MD  Specialists: Dr. Marchelle Gearing. Pulmonologist.  Chief Complaint: Unresponsiveness.  History obtained from patient and patient's son and ER physician.  HPI: BAWI Glen Lopez is a 60 y.o. male with history of COPD, chronic back pain and hypertension was brought to the ER the patient's son found last night the patient was minimally responsive. As per the patient's son patient was looking pale and diaphoretic and minimally responsive. Patient had to be given CPR and EMS was called. EMS gave patient Narcan and patient was brought to the ER. By the time patient is ER patient has become more responsive but still confused. Patient had leukocytosis and was hypotensive initially. After fluid resuscitation patient's blood pressure improved. Lactic acid was normal. Chest x-ray shows bronchitic changes. UA was unremarkable. On my exam patient has become alert awake and oriented and stated that patient had taken more than required [place] medications. Patient is also found to be wheezing bilaterally. Patient will be admitted for further observation of acute encephalopathy most likely from pain medications and COPD exacerbation. Patient's lithium levels are pending but patient states he takes lithium only alternative days (not as advised). Denies any suicidal ideation or thoughts. Patient denies taking any Tylenol or salicylates.  Review of Systems: As presented in the history of presenting illness, rest negative.  Past Medical History  Diagnosis Date  . COPD (chronic obstructive pulmonary disease) (HCC)   . Hiatal hernia   . Emphysema   . Emphysema   . Bipolar disorder (HCC)   . Hypertension   . Shortness of breath   . GERD (gastroesophageal reflux disease)   . Headache(784.0)   . Arthritis   . Depression   . Emphysema of lung (HCC)   .  Anxiety   . Asthma    Past Surgical History  Procedure Laterality Date  . Artheroscopic knee surgery r Right 01/2013   Social History:  reports that he has been smoking Cigarettes.  He has a 48 pack-year smoking history. He has never used smokeless tobacco. He reports that he uses illicit drugs (Cocaine). He reports that he does not drink alcohol. Where does patient live at home. Can patient participate in ADLs? Yes.  Allergies  Allergen Reactions  . Codeine Nausea And Vomiting    Family History:  Family History  Problem Relation Age of Onset  . Heart disease Father   . Asthma Sister       Prior to Admission medications   Medication Sig Start Date End Date Taking? Authorizing Provider  albuterol (PROVENTIL HFA;VENTOLIN HFA) 108 (90 BASE) MCG/ACT inhaler Inhale 2 puffs into the lungs every 4 (four) hours as needed. For shortness of breath   Yes Historical Provider, MD  albuterol (PROVENTIL) (2.5 MG/3ML) 0.083% nebulizer solution Take 2.5 mg by nebulization every 6 (six) hours as needed. For shortness of breath   Yes Historical Provider, MD  ALPRAZolam Prudy Feeler) 1 MG tablet Take 1 mg by mouth 3 (three) times daily as needed for anxiety.    Yes Historical Provider, MD  amLODipine (NORVASC) 5 MG tablet Take 5 mg by mouth daily. 10/28/15  Yes Historical Provider, MD  atenolol-chlorthalidone (TENORETIC) 100-25 MG per tablet Take 1 tablet by mouth daily.    Yes Historical Provider, MD  budesonide-formoterol (SYMBICORT) 160-4.5 MCG/ACT inhaler Inhale 2 puffs into the lungs 2 (two) times daily.   Yes Historical Provider, MD  lithium  carbonate 300 MG capsule Take 300 mg by mouth 3 (three) times daily with meals.   Yes Historical Provider, MD  Multiple Vitamin (MULTIVITAMIN WITH MINERALS) TABS tablet Take 1 tablet by mouth daily.   Yes Historical Provider, MD  Omega-3 Fatty Acids (FISH OIL) 500 MG CAPS Take 500 mg by mouth daily.   Yes Historical Provider, MD  omeprazole (PRILOSEC) 20 MG capsule  Take 20 mg by mouth daily as needed (for acid reflux/indigestion).   Yes Historical Provider, MD  Tiotropium Bromide Monohydrate (SPIRIVA RESPIMAT) 2.5 MCG/ACT AERS 2 puffs each morning 12/02/15  Yes Kalman Shan, MD  cyclobenzaprine (FLEXERIL) 5 MG tablet Take 1 tablet (5 mg total) by mouth at bedtime as needed. 11/27/15   Thao P Le, DO  nystatin (MYCOSTATIN) 100000 UNIT/ML suspension Take 5 mLs (500,000 Units total) by mouth 4 (four) times daily. Patient not taking: Reported on 12/08/2015 09/10/15   Kalman Shan, MD  oxyCODONE-acetaminophen (PERCOCET/ROXICET) 5-325 MG tablet Take 1 tablet by mouth every 8 (eight) hours as needed for severe pain. Do not take with tylenol, take with stool softener Patient not taking: Reported on 12/08/2015 11/30/15   Wallis Bamberg, PA-C    Physical Exam: Filed Vitals:   12/08/15 0254 12/08/15 0315 12/08/15 0330 12/08/15 0400  BP:  99/42 88/45 100/50  Pulse:  101 99 94  Temp: 98.1 F (36.7 C)     TempSrc: Oral     Resp:  SpO2:  93% 94% 95%     General:  Moderately built and nourished.  Eyes: Anicteric no pallor.  ENT: No discharge from the years his nose or mouth.  Neck: No mass felt.  Cardiovascular: S1-S2 heard.  Respiratory: Bilateral expiratory wheeze heard no crepitations.  Abdomen: Soft nontender bowel sounds present.  Skin: No rash.  Musculoskeletal: No edema.  Psychiatric: Appears normal. Denies any suicidal ideation.  Neurologic: Alert awake oriented to time place and person. Moves all extremities.  Labs on Admission:  Basic Metabolic Panel:  Recent Labs Lab 12/07/15 2352  NA 141  K 4.4  CL 103  CO2 27  GLUCOSE 138*  BUN 27*  CREATININE 1.74*  CALCIUM 8.7*   Liver Function Tests:  Recent Labs Lab 12/07/15 2352  AST 362*  ALT 341*  ALKPHOS 91  BILITOT 0.8  PROT 8.1  ALBUMIN 4.5   No results for input(s): LIPASE, AMYLASE in the last 168 hours. No results for input(s): AMMONIA in the last 168  hours. CBC:  Recent Labs Lab 12/07/15 2352  WBC 34.9*  HGB 17.7*  HCT 53.3*  MCV 99.8  PLT 162   Cardiac Enzymes: No results for input(s): CKTOTAL, CKMB, CKMBINDEX, TROPONINI in the last 168 hours.  BNP (last 3 results) No results for input(s): BNP in the last 8760 hours.  ProBNP (last 3 results) No results for input(s): PROBNP in the last 8760 hours.  CBG:  Recent Labs Lab 12/07/15 2342  GLUCAP 150*    Radiological Exams on Admission: Dg Chest Port 1 View  12/08/2015  CLINICAL DATA:  Acute onset of vomiting. Status post overdose. Initial encounter. EXAM: PORTABLE CHEST 1 VIEW COMPARISON:  Chest radiograph performed 09/10/2015, and CT of the chest performed 11/03/2015 FINDINGS: The lungs are well-aerated. Mild vascular congestion is noted. Mild peribronchial thickening is seen. There is no evidence of focal opacification, pleural effusion or pneumothorax. The cardiomediastinal silhouette is within normal limits. No acute osseous abnormalities are seen. IMPRESSION: Mild vascular congestion noted.  Mild peribronchial thickening  seen. Electronically Signed   By: Roanna Raider M.D.   On: 12/08/2015 01:39    EKG: Independently reviewed - sinus tachycardia.  Assessment/Plan Principal Problem:   Acute encephalopathy Active Problems:   Essential hypertension, benign   Bipolar disorder, unspecified (HCC)   COPD exacerbation (HCC)   1. Acute encephalopathy - essentially resolved at this time. Most likely secondary to pain relief medications. At this time will closely observe. 2. COPD exacerbation - I have placed patient on Zithromax Solu-Medrol nebulizers and Pulmicort. 3. History of hypertension present hypotensive so I'm holding off antihypertensives for now. Closely observe. 4. Leukocytosis probably reactionary. Blood cultures and urine cultures have been sent. Patient is presently on empiric antibiotics Zithromax for COPD. 5. Elevated LFTs - most likely from hypotensive  spell. Tylenol levels were negative. Follow LFTs closely. Patient's abdomen appears benign. 6. Acute renal failure probably from hypotension - closely follow metabolic panel. Continue with hydration. Holding antihypertensives.  Since patient had a CPR as per the patient's son I'm cycling cardiac markers. Patient denies any chest pain.   DVT Prophylaxis SCDs.  Code Status: Full code.  Family Communication: Patient's son and wife at the bedside.  Disposition Plan: Admit for observation.    Briante Loveall N. [facility] Pager [phone].  If 7PM-7AM, please contact night-coverage [email or web address] Password [facility] 12/08/2015, 4:46 AM

## 2015-12-08 NOTE — Progress Notes (Signed)
Echocardiogram 2D Echocardiogram has been performed.  Glen Lopez 12/08/2015, 12:06 PM

## 2015-12-08 NOTE — ED Notes (Signed)
Nurse drawing labs. 

## 2015-12-08 NOTE — ED Notes (Signed)
Pt attempting to void.  

## 2015-12-08 NOTE — Progress Notes (Signed)
PT Cancellation Note  Patient Details Name: YOSSEF GILKISON MRN: [id number] DOB: Jan 11, 1956   Cancelled Treatment:    Reason Eval/Treat Not Completed: Fatigue/lethargy limiting ability to participate (attempted to awaken, asllep will see first thing in AM for DC.)   Sharen Heck PT [phone]  12/08/2015, 4:04 PM

## 2015-12-08 NOTE — Care Management Note (Signed)
Case Management Note  Patient Details  Name: XYLON CROOM MRN: [id number] Date of Birth: [birth year]  Subjective/Objective: 60 y/o m admitted w/Acute encephalopathy. From home.PT cons-await recc.                   Action/Plan:d/c plan home.   Expected Discharge Date:                  Expected Discharge Plan:  Home/Self Care  In-House Referral:     Discharge planning Services  CM Consult  Post Acute Care Choice:    Choice offered to:     DME Arranged:    DME Agency:     HH Arranged:    HH Agency:     Status of Service:  In process, will continue to follow  Medicare Important Message Given:    Date Medicare IM Given:    Medicare IM give by:    Date Additional Medicare IM Given:    Additional Medicare Important Message give by:     If discussed at Long Length of Stay Meetings, dates discussed:    Additional Comments:  Lanier Clam, RN 12/08/2015, 1:27 PM

## 2015-12-08 NOTE — Progress Notes (Signed)
Progress note: Patient admitted earlier during the day by my colleague , notes reviewed, labs and imaging reviewed ,60 year old male with history of COPD, back pain, chronic pain syndrome, presents with an episode of normal altered mental status, secondary to pain medication, reports he took Opana tablets from his neighbor, unresponsive where his son perform CPR on him, in terms resolved with Narcan, admitted for COPD exacerbation. - Awake alert oriented 3 - Good air entry bilaterally, end-expiratory wheezing - Regular rate and rhythm, no rubs murmurs gallops - Abdomen soft nontender nondistended bowel sounds present - Extremities with no edema  Assessment and plan; - Acute encephalopathy secondary to pain medication: Resolved with Narcan, back to baseline - COPD exacerbation: Continue with nebs, antibiotics, steroids and Pulmicort - Chronic respiratory failure: At baseline -Elevated LFTs: From hypotensive spell, recheck in a.m. - Acute renal failure: Secondary to hypotension, continue with IV fluids - Elevated troponins: Demand ischemia, cardiology consult appreciated  Huey Bienenstock MD

## 2015-12-08 NOTE — Consult Note (Signed)
Primary cardiologist: New  HPI: 60 year old male with past medical history of home O2 dependent COPD, chronic pain, hypertension, tobacco abuse for evaluation of abnormal troponin. No prior cardiac history. Patient does have dyspnea on exertion but no orthopnea, PND, pedal edema, palpitations, syncope or exertional chest pain. Patient apparently was found unresponsive yesterday by his son after taking pain medications. His son perform CPR by report. Narcan was given the patient became more responsive. Troponins were checked and are minimally elevated. Cardiology asked to evaluate.  Medications Prior to Admission  Medication Sig Dispense Refill  . albuterol (PROVENTIL HFA;VENTOLIN HFA) 108 (90 BASE) MCG/ACT inhaler Inhale 2 puffs into the lungs every 4 (four) hours as needed. For shortness of breath    . albuterol (PROVENTIL) (2.5 MG/3ML) 0.083% nebulizer solution Take 2.5 mg by nebulization every 6 (six) hours as needed. For shortness of breath    . ALPRAZolam (XANAX) 1 MG tablet Take 1 mg by mouth 3 (three) times daily as needed for anxiety.     Marland Kitchen amLODipine (NORVASC) 5 MG tablet Take 5 mg by mouth daily.    Marland Kitchen atenolol-chlorthalidone (TENORETIC) 100-25 MG per tablet Take 1 tablet by mouth daily.     . budesonide-formoterol (SYMBICORT) 160-4.5 MCG/ACT inhaler Inhale 2 puffs into the lungs 2 (two) times daily.    Marland Kitchen lithium carbonate 300 MG capsule Take 300 mg by mouth 3 (three) times daily with meals.    . Multiple Vitamin (MULTIVITAMIN WITH MINERALS) TABS tablet Take 1 tablet by mouth daily.    . Omega-3 Fatty Acids (FISH OIL) 500 MG CAPS Take 500 mg by mouth daily.    Marland Kitchen omeprazole (PRILOSEC) 20 MG capsule Take 20 mg by mouth daily as needed (for acid reflux/indigestion).    . Tiotropium Bromide Monohydrate (SPIRIVA RESPIMAT) 2.5 MCG/ACT AERS 2 puffs each morning 4 g 11  . cyclobenzaprine (FLEXERIL) 5 MG tablet Take 1 tablet (5 mg total) by mouth at bedtime as needed. 30 tablet 0  . nystatin  (MYCOSTATIN) 100000 UNIT/ML suspension Take 5 mLs (500,000 Units total) by mouth 4 (four) times daily. (Patient not taking: Reported on 12/08/2015) 100 mL 0  . oxyCODONE-acetaminophen (PERCOCET/ROXICET) 5-325 MG tablet Take 1 tablet by mouth every 8 (eight) hours as needed for severe pain. Do not take with tylenol, take with stool softener (Patient not taking: Reported on 12/08/2015) 10 tablet 0    Allergies  Allergen Reactions  . Codeine Nausea And Vomiting    Past Medical History  Diagnosis Date  . COPD (chronic obstructive pulmonary disease) ([place])   . Hiatal hernia   . Bipolar disorder ([place])   . Hypertension   . GERD (gastroesophageal reflux disease)   . Headache(784.0)   . Arthritis   . Depression   . Anxiety   . Asthma     Past Surgical History  Procedure Laterality Date  . Artheroscopic knee surgery r Right 01/2013    Social History   Social History  . Marital Status: Married    Spouse Name: N/A  . Number of Children: 4  . Years of Education: N/A   Occupational History  . disbaled    Social History Main Topics  . Smoking status: Current Every Day Smoker -- 1.00 packs/day for 48 years    Types: Cigarettes  . Smokeless tobacco: Never Used     Comment: 2 cigs daily//09/10/15  . Alcohol Use: No  . Drug Use: Yes    Special: Cocaine     Comment: First  tiem user  . Sexual Activity: Not on file   Other Topics Concern  . Not on file   Social History Narrative    Family History  Problem Relation Age of Onset  . Heart disease Father     MI at age 59  . Asthma Sister     ROS:  Low back and bilateral hip pain, pain in chest with palpation but no fevers or chills, hemoptysis, dysphasia, odynophagia, melena, hematochezia, dysuria, hematuria, rash, seizure activity, orthopnea, PND, pedal edema, claudication. Remaining systems are negative.  Physical Exam:   Blood pressure 96/51, pulse 84, temperature 99.1 F (37.3 C), temperature source Oral, resp. rate 16, height  5' 5" (1.651 m), weight 169 lb 15.6 oz (77.1 kg), SpO2 92 %.  General:  Well developed/well nourished in NAD Skin warm/dry, tattoos noted Patient not depressed No peripheral clubbing Back-normal HEENT-normal/normal eyelids Neck supple/normal carotid upstroke bilaterally; no bruits; no JVD; no thyromegaly chest - Diminished breath sounds throughout with mild expiratory wheeze. CV - RRR/normal S1 and S2; no murmurs, rubs or gallops;  PMI nondisplaced Abdomen -NT/ND, no HSM, no mass, + bowel sounds, no bruit 2+ femoral pulses, no bruits Ext-no edema, no chords, 2+ DP on the left with diminished on the right Neuro-grossly nonfocal  ECG Sinus tachycardia with RV conduction delay.  Results for orders placed or performed during the hospital encounter of 12/07/15 (from the past 48 hour(s))  CBG monitoring, ED     Status: Abnormal   Collection Time: 12/07/15 11:42 PM  Result Value Ref Range   Glucose-Capillary 150 (H) 65 - 99 mg/dL  Comprehensive metabolic panel     Status: Abnormal   Collection Time: 12/07/15 11:52 PM  Result Value Ref Range   Sodium 141 135 - 145 mmol/L   Potassium 4.4 3.5 - 5.1 mmol/L   Chloride 103 101 - 111 mmol/L   CO2 27 22 - 32 mmol/L   Glucose, Bld 138 (H) 65 - 99 mg/dL   BUN 27 (H) 6 - 20 mg/dL   Creatinine, Ser 1.74 (H) 0.61 - 1.24 mg/dL   Calcium 8.7 (L) 8.9 - 10.3 mg/dL   Total Protein 8.1 6.5 - 8.1 g/dL   Albumin 4.5 3.5 - 5.0 g/dL   AST 362 (H) 15 - 41 U/L   ALT 341 (H) 17 - 63 U/L   Alkaline Phosphatase 91 38 - 126 U/L   Total Bilirubin 0.8 0.3 - 1.2 mg/dL   GFR calc non Af Amer 41 (L) >60 mL/min   GFR calc Af Amer 48 (L) >60 mL/min    Comment: (NOTE) The eGFR has been calculated using the CKD EPI equation. This calculation has not been validated in all clinical situations. eGFR's persistently <60 mL/min signify possible Chronic Kidney Disease.    Anion gap 11 5 - 15  Ethanol (ETOH)     Status: None   Collection Time: 12/07/15 11:52 PM    Result Value Ref Range   Alcohol, Ethyl (B) <5 <5 mg/dL    Comment:        LOWEST DETECTABLE LIMIT FOR SERUM ALCOHOL IS 5 mg/dL FOR MEDICAL PURPOSES ONLY   Salicylate level     Status: None   Collection Time: 12/07/15 11:52 PM  Result Value Ref Range   Salicylate Lvl <4.0 2.8 - 30.0 mg/dL  Acetaminophen level     Status: Abnormal   Collection Time: 12/07/15 11:52 PM  Result Value Ref Range   Acetaminophen (Tylenol), Serum <10 (L) 10 -   30 ug/mL    Comment:        THERAPEUTIC CONCENTRATIONS VARY SIGNIFICANTLY. A RANGE OF 10-30 ug/mL MAY BE AN EFFECTIVE CONCENTRATION FOR MANY PATIENTS. HOWEVER, SOME ARE BEST TREATED AT CONCENTRATIONS OUTSIDE THIS RANGE. ACETAMINOPHEN CONCENTRATIONS >150 ug/mL AT 4 HOURS AFTER INGESTION AND >50 ug/mL AT 12 HOURS AFTER INGESTION ARE OFTEN ASSOCIATED WITH TOXIC REACTIONS.   CBC     Status: Abnormal   Collection Time: 12/07/15 11:52 PM  Result Value Ref Range   WBC 34.9 (H) 4.0 - 10.5 K/uL   RBC 5.34 4.22 - 5.81 MIL/uL   Hemoglobin 17.7 (H) 13.0 - 17.0 g/dL   HCT 53.3 (H) 39.0 - 52.0 %   MCV 99.8 78.0 - 100.0 fL   MCH 33.1 26.0 - 34.0 pg   MCHC 33.2 30.0 - 36.0 g/dL   RDW 15.7 (H) 11.5 - 15.5 %   Platelets 162 150 - 400 K/uL  Blood Culture (routine x 2)     Status: None (Preliminary result)   Collection Time: 12/08/15 12:26 AM  Result Value Ref Range   Specimen Description BLOOD LEFT ANTECUBITAL    Special Requests      BOTTLES DRAWN AEROBIC AND ANAEROBIC 5ML Performed at [facility]    Culture PENDING    Report Status PENDING   I-Stat CG4 Lactic Acid, ED  (not at  [facility])     Status: None   Collection Time: 12/08/15 12:52 AM  Result Value Ref Range   Lactic Acid, Venous 1.80 0.5 - 2.0 mmol/L  Blood Culture (routine x 2)     Status: None (Preliminary result)   Collection Time: 12/08/15  1:06 AM  Result Value Ref Range   Specimen Description BLOOD LEFT HAND    Special Requests      BOTTLES DRAWN AEROBIC ONLY 5CC Performed  at [facility]    Culture PENDING    Report Status PENDING   Blood gas, arterial ([facility] & [facility] ONLY)     Status: Abnormal   Collection Time: 12/08/15  1:51 AM  Result Value Ref Range   O2 Content 4.0 L/min   Delivery systems NASAL CANNULA    pH, Arterial 7.339 (L) 7.350 - 7.450   pCO2 arterial 47.5 (H) 35.0 - 45.0 mmHg   pO2, Arterial 62.7 (L) 80.0 - 100.0 mmHg   Bicarbonate 25.1 (H) 20.0 - 24.0 mEq/L   TCO2 22.2 0 - 100 mmol/L   Acid-base deficit 1.0 0.0 - 2.0 mmol/L   O2 Saturation 91.4 %   Patient temperature 97.4    Collection site RIGHT RADIAL    Drawn by [phone]    Sample type ARTERIAL DRAW    Allens test (pass/fail) PASS PASS  Urine rapid drug screen (hosp performed) (Not at [facility])     Status: Abnormal   Collection Time: 12/08/15  2:00 AM  Result Value Ref Range   Opiates POSITIVE (A) NONE DETECTED   Cocaine NONE DETECTED NONE DETECTED   Benzodiazepines POSITIVE (A) NONE DETECTED   Amphetamines NONE DETECTED NONE DETECTED   Tetrahydrocannabinol POSITIVE (A) NONE DETECTED   Barbiturates NONE DETECTED NONE DETECTED    Comment:        DRUG SCREEN FOR MEDICAL PURPOSES ONLY.  IF CONFIRMATION IS NEEDED FOR ANY PURPOSE, NOTIFY LAB WITHIN 5 DAYS.        LOWEST DETECTABLE LIMITS FOR URINE DRUG SCREEN Drug Class       Cutoff (ng/mL) Amphetamine      1000 Barbiturate  200 Benzodiazepine   200 Tricyclics       300 Opiates          300 Cocaine          300 THC              50   Urinalysis, Routine w reflex microscopic (not at [facility])     Status: Abnormal   Collection Time: 12/08/15  2:00 AM  Result Value Ref Range   Color, Urine YELLOW YELLOW   APPearance CLOUDY (A) CLEAR   Specific Gravity, Urine 1.015 1.005 - 1.030   pH 5.0 5.0 - 8.0   Glucose, UA 250 (A) NEGATIVE mg/dL   Hgb urine dipstick TRACE (A) NEGATIVE   Bilirubin Urine NEGATIVE NEGATIVE   Ketones, ur NEGATIVE NEGATIVE mg/dL   Protein, ur 30 (A) NEGATIVE mg/dL   Nitrite NEGATIVE NEGATIVE   Leukocytes,  UA NEGATIVE NEGATIVE  Urine microscopic-add on     Status: Abnormal   Collection Time: 12/08/15  2:00 AM  Result Value Ref Range   Squamous Epithelial / LPF 0-5 (A) NONE SEEN   WBC, UA 0-5 0 - 5 WBC/hpf   RBC / HPF 0-5 0 - 5 RBC/hpf   Bacteria, UA RARE (A) NONE SEEN   Casts GRANULAR CAST (A) NEGATIVE    Comment: HYALINE CASTS  I-Stat CG4 Lactic Acid, ED  (not at  [facility])     Status: None   Collection Time: 12/08/15  3:38 AM  Result Value Ref Range   Lactic Acid, Venous 0.85 0.5 - 2.0 mmol/L  Lithium level     Status: Abnormal   Collection Time: 12/08/15  4:03 AM  Result Value Ref Range   Lithium Lvl <0.06 (L) 0.60 - 1.20 mmol/L  Protime-INR     Status: None   Collection Time: 12/08/15  4:03 AM  Result Value Ref Range   Prothrombin Time 14.2 11.6 - 15.2 seconds   INR 1.08 0.00 - 1.49  Hepatic function panel     Status: Abnormal   Collection Time: 12/08/15  4:03 AM  Result Value Ref Range   Total Protein 5.6 (L) 6.5 - 8.1 g/dL   Albumin 3.2 (L) 3.5 - 5.0 g/dL   AST 178 (H) 15 - 41 U/L   ALT 218 (H) 17 - 63 U/L   Alkaline Phosphatase 59 38 - 126 U/L   Total Bilirubin 0.6 0.3 - 1.2 mg/dL   Bilirubin, Direct 0.2 0.1 - 0.5 mg/dL   Indirect Bilirubin 0.4 0.3 - 0.9 mg/dL  Basic metabolic panel     Status: Abnormal   Collection Time: 12/08/15  4:03 AM  Result Value Ref Range   Sodium 144 135 - 145 mmol/L   Potassium 4.0 3.5 - 5.1 mmol/L   Chloride 111 101 - 111 mmol/L   CO2 27 22 - 32 mmol/L   Glucose, Bld 86 65 - 99 mg/dL   BUN 24 (H) 6 - 20 mg/dL   Creatinine, Ser 1.31 (H) 0.61 - 1.24 mg/dL   Calcium 7.6 (L) 8.9 - 10.3 mg/dL   GFR calc non Af Amer 58 (L) >60 mL/min   GFR calc Af Amer >60 >60 mL/min    Comment: (NOTE) The eGFR has been calculated using the CKD EPI equation. This calculation has not been validated in all clinical situations. eGFR's persistently <60 mL/min signify possible Chronic Kidney Disease.    Anion gap 6 5 - 15  Lactic acid, plasma     Status: None      Collection Time: 12/08/15  4:34 AM  Result Value Ref Range   Lactic Acid, Venous 0.8 0.5 - 2.0 mmol/L  Troponin I (q 6hr x 3)     Status: Abnormal   Collection Time: 12/08/15  5:51 AM  Result Value Ref Range   Troponin I 0.34 (H) <0.031 ng/mL    Comment:        PERSISTENTLY INCREASED TROPONIN VALUES IN THE RANGE OF 0.04-0.49 ng/mL CAN BE SEEN IN:       -UNSTABLE ANGINA       -CONGESTIVE HEART FAILURE       -MYOCARDITIS       -CHEST TRAUMA       -ARRYHTHMIAS       -LATE PRESENTING MYOCARDIAL INFARCTION       -COPD   CLINICAL FOLLOW-UP RECOMMENDED.   Comprehensive metabolic panel     Status: Abnormal   Collection Time: 12/08/15  5:51 AM  Result Value Ref Range   Sodium 143 135 - 145 mmol/L   Potassium 4.3 3.5 - 5.1 mmol/L   Chloride 110 101 - 111 mmol/L   CO2 24 22 - 32 mmol/L   Glucose, Bld 104 (H) 65 - 99 mg/dL   BUN 23 (H) 6 - 20 mg/dL   Creatinine, Ser 1.32 (H) 0.61 - 1.24 mg/dL   Calcium 8.2 (L) 8.9 - 10.3 mg/dL   Total Protein 6.2 (L) 6.5 - 8.1 g/dL   Albumin 3.5 3.5 - 5.0 g/dL   AST 172 (H) 15 - 41 U/L   ALT 234 (H) 17 - 63 U/L   Alkaline Phosphatase 63 38 - 126 U/L   Total Bilirubin 0.9 0.3 - 1.2 mg/dL   GFR calc non Af Amer 57 (L) >60 mL/min   GFR calc Af Amer >60 >60 mL/min    Comment: (NOTE) The eGFR has been calculated using the CKD EPI equation. This calculation has not been validated in all clinical situations. eGFR's persistently <60 mL/min signify possible Chronic Kidney Disease.    Anion gap 9 5 - 15  CBC WITH DIFFERENTIAL     Status: Abnormal   Collection Time: 12/08/15  5:51 AM  Result Value Ref Range   WBC 16.3 (H) 4.0 - 10.5 K/uL   RBC 4.51 4.22 - 5.81 MIL/uL   Hemoglobin 14.5 13.0 - 17.0 g/dL   HCT 45.0 39.0 - 52.0 %   MCV 99.8 78.0 - 100.0 fL   MCH 32.2 26.0 - 34.0 pg   MCHC 32.2 30.0 - 36.0 g/dL   RDW 15.7 (H) 11.5 - 15.5 %   Platelets 119 (L) 150 - 400 K/uL    Comment: PLATELET COUNT CONFIRMED BY SMEAR   Neutrophils Relative % 94 %    Neutro Abs 15.3 (H) 1.7 - 7.7 K/uL   Lymphocytes Relative 4 %   Lymphs Abs 0.7 0.7 - 4.0 K/uL   Monocytes Relative 2 %   Monocytes Absolute 0.3 0.1 - 1.0 K/uL   Eosinophils Relative 0 %   Eosinophils Absolute 0.0 0.0 - 0.7 K/uL   Basophils Relative 0 %   Basophils Absolute 0.0 0.0 - 0.1 K/uL  Troponin I (q 6hr x 3)     Status: Abnormal   Collection Time: 12/08/15 11:44 AM  Result Value Ref Range   Troponin I 0.21 (H) <0.031 ng/mL    Comment:        PERSISTENTLY INCREASED TROPONIN VALUES IN THE RANGE OF 0.04-0.49 ng/mL CAN BE SEEN IN:       -  UNSTABLE ANGINA       -CONGESTIVE HEART FAILURE       -MYOCARDITIS       -CHEST TRAUMA       -ARRYHTHMIAS       -LATE PRESENTING MYOCARDIAL INFARCTION       -COPD   CLINICAL FOLLOW-UP RECOMMENDED.     Dg Chest Port 1 View  12/08/2015  CLINICAL DATA:  Acute onset of vomiting. Status post overdose. Initial encounter. EXAM: PORTABLE CHEST 1 VIEW COMPARISON:  Chest radiograph performed 09/10/2015, and CT of the chest performed 11/03/2015 FINDINGS: The lungs are well-aerated. Mild vascular congestion is noted. Mild peribronchial thickening is seen. There is no evidence of focal opacification, pleural effusion or pneumothorax. The cardiomediastinal silhouette is within normal limits. No acute osseous abnormalities are seen. IMPRESSION: Mild vascular congestion noted.  Mild peribronchial thickening seen. Electronically Signed   By: Jeffery  Chang M.D.   On: 12/08/2015 01:39    Assessment/Plan 1 elevated troponin-patient has not had chest pain and electrocardiogram is normal. Mild elevation in troponin likely secondary to CPR and renal insufficiency. Would arrange outpatient nuclear study following discharge. 2 COPD-management per primary care. 3 tobacco abuse-patient counseled on discontinuing. 4 acute renal insufficiency-likely secondary to respiratory insufficiency/unresponsive episode. Note liver functions are also mildly elevated.Further evaluation  per primary care.  We will sign off. Please call with questions. Brian Crenshaw MD 12/08/2015, 12:51 PM   

## 2015-12-08 NOTE — ED Notes (Signed)
Pt requests breathing treatment to be delayed since he is nauseous and vomited 20 minutes ago

## 2015-12-08 NOTE — Progress Notes (Signed)
Pharmacy Antibiotic Note  Glen Lopez is a 60 y.o. male admitted on 12/07/2015 with Sepsis.  Pharmacy has been consulted for Zosyn/Vancomycin dosing.  Plan: Vancomycin 1Gm x1 then   IV every 12 hours.  Goal trough 15-20 mcg/mL.  Zosyn 3.375 gm IV q8h EI    Temp (24hrs), Avg:97.4 F (36.3 C), Min:97.4 F (36.3 C), Max:97.4 F (36.3 C)   Recent Labs Lab 12/07/15 2352 12/08/15 0052  WBC 34.9*  --   CREATININE 1.74*  --   LATICACIDVEN  --  1.80    Estimated Creatinine Clearance: 44.3 mL/min (by C-G formula based on Cr of 1.74).    Allergies  Allergen Reactions  . Codeine Nausea And Vomiting    Antimicrobials this admission: 2/7 zosyn >>  2/7 vancomycin >>   Dose adjustments this admission:   Microbiology results:  BCx:  UCx:  Sputum:   MRSA PCR:   Thank you for allowing pharmacy to be a part of this patient's care.  Susanne Greenhouse R 12/08/2015 1:00 AM

## 2015-12-09 ENCOUNTER — Other Ambulatory Visit: Payer: Self-pay | Admitting: Nurse Practitioner

## 2015-12-09 DIAGNOSIS — G9340 Encephalopathy, unspecified: Secondary | ICD-10-CM

## 2015-12-09 DIAGNOSIS — J441 Chronic obstructive pulmonary disease with (acute) exacerbation: Principal | ICD-10-CM

## 2015-12-09 DIAGNOSIS — R072 Precordial pain: Secondary | ICD-10-CM

## 2015-12-09 DIAGNOSIS — I10 Essential (primary) hypertension: Secondary | ICD-10-CM

## 2015-12-09 DIAGNOSIS — F319 Bipolar disorder, unspecified: Secondary | ICD-10-CM

## 2015-12-09 LAB — URINE CULTURE: Culture: NO GROWTH

## 2015-12-09 LAB — HEPATIC FUNCTION PANEL
ALBUMIN: 3.4 g/dL — AB (ref 3.5–5.0)
ALT: 153 U/L — AB (ref 17–63)
AST: 55 U/L — AB (ref 15–41)
Alkaline Phosphatase: 56 U/L (ref 38–126)
Bilirubin, Direct: 0.1 mg/dL (ref 0.1–0.5)
Indirect Bilirubin: 0.2 mg/dL — ABNORMAL LOW (ref 0.3–0.9)
TOTAL PROTEIN: 6.2 g/dL — AB (ref 6.5–8.1)
Total Bilirubin: 0.3 mg/dL (ref 0.3–1.2)

## 2015-12-09 LAB — CBC
HCT: 43.6 % (ref 39.0–52.0)
HEMOGLOBIN: 14 g/dL (ref 13.0–17.0)
MCH: 32 pg (ref 26.0–34.0)
MCHC: 32.1 g/dL (ref 30.0–36.0)
MCV: 99.5 fL (ref 78.0–100.0)
Platelets: 111 10*3/uL — ABNORMAL LOW (ref 150–400)
RBC: 4.38 MIL/uL (ref 4.22–5.81)
RDW: 15.2 % (ref 11.5–15.5)
WBC: 16.3 10*3/uL — AB (ref 4.0–10.5)

## 2015-12-09 LAB — BASIC METABOLIC PANEL
Anion gap: 6 (ref 5–15)
BUN: 17 mg/dL (ref 6–20)
CHLORIDE: 109 mmol/L (ref 101–111)
CO2: 24 mmol/L (ref 22–32)
Calcium: 8.4 mg/dL — ABNORMAL LOW (ref 8.9–10.3)
Creatinine, Ser: 0.94 mg/dL (ref 0.61–1.24)
Glucose, Bld: 176 mg/dL — ABNORMAL HIGH (ref 65–99)
POTASSIUM: 4.7 mmol/L (ref 3.5–5.1)
SODIUM: 139 mmol/L (ref 135–145)

## 2015-12-09 MED ORDER — PREDNISONE 20 MG PO TABS: 40.0000 mg | ORAL_TABLET | Freq: Every day | ORAL | Status: DC

## 2015-12-09 MED ORDER — AZITHROMYCIN 250 MG PO TABS: 250.0000 mg | ORAL_TABLET | Freq: Every day | ORAL | Status: DC

## 2015-12-09 MED ORDER — ASPIRIN EC 81 MG PO TBEC: 81.0000 mg | DELAYED_RELEASE_TABLET | Freq: Every day | ORAL | Status: AC

## 2015-12-09 NOTE — Progress Notes (Signed)
   2d Echo reviewed.  Nl EF.  Will arrange for oupt MV as previously planned.  Study Conclusions  - Left ventricle: The cavity size was normal. Wall thickness was   normal. Systolic function was vigorous. The estimated ejection   fraction was in the range of 65% to 70%. Wall motion was normal;   there were no regional wall motion abnormalities. Doppler   parameters are consistent with abnormal left ventricular   relaxation (grade 1 diastolic dysfunction). - Aortic valve: There was no stenosis. - Mitral valve: There was no significant regurgitation. - Right ventricle: The cavity size was normal. Systolic function   was normal. - Pulmonary arteries: No complete TR doppler jet so unable to   estimate PA systolic pressure. - Systemic veins: IVC measured 2.4 cm with > 50% respirophasic   variation, suggesting RA pressure 8 mmHg.  Nicolasa Ducking, NP 12/09/2015, 10:04 AM

## 2015-12-09 NOTE — Evaluation (Signed)
Physical Therapy Evaluation Patient Details Name: Glen Lopez MRN: [id number] DOB: Mar 09, 1956 Today's Date: 12/09/2015   History of Present Illness  HPIPatient brought to ED 12/07/15 via EMS after reportedly snorted heroin.  He was found by EMS with respiratory depression unresponsive. Given  Narcan with patient becoming alert  to person and situation. patient has H/O COPD(home O2),depression, bipolar.  Clinical Impression     Follow Up Recommendations No PT follow up (patient declines)    Equipment Recommendations  None recommended by PT    Recommendations for Other Services       Precautions / Restrictions Precautions Precautions: Fall Precaution Comments: h/o multiple falls per pt. related to R knee dysfunction. Fell dowb=n steps when cane slipped per patient,. showed multiple bruises on arm. on O2 at home 3.5 l.      Mobility  Bed Mobility Overal bed mobility: Independent                Transfers Overall transfer level: Independent                  Ambulation/Gait Ambulation/Gait assistance: Min assist;Min guard Ambulation Distance (Feet): 100 Feet (x2) Assistive device: None;1 person hand held assist Gait Pattern/deviations: Step-through pattern;Staggering left;Staggering right     General Gait Details: patient with  balance loss coming out of the BR, srteady assist provided. ambulated on RA per patient request. Second walk with sats dropping to 86%. HR 107.   Stairs            Wheelchair Mobility    Modified Rankin (Stroke Patients Only)       Balance Overall balance assessment: History of Falls;Needs assistance         Standing balance support: During functional activity;No upper extremity supported Standing balance-Leahy Scale: Poor Standing balance comment: loss of balance x 1 staggered .caught self on door frame.                             Pertinent Vitals/Pain Pain Assessment: No/denies pain    Home Living  Family/patient expects to be discharged to:: Private residence Living Arrangements: Spouse/significant other;Children;Other relatives Available Help at Discharge: Family Type of Home: Mobile home Home Access: Stairs to enter Entrance Stairs-Rails: Right;Left Entrance Stairs-Number of Steps: 10 Home Layout: One level Home Equipment: Walker - 2 wheels;Cane - single point      Prior Function Level of Independence: Independent with assistive device(s)               Hand Dominance        Extremity/Trunk Assessment   Upper Extremity Assessment: Overall WFL for tasks assessed           Lower Extremity Assessment: RLE deficits/detail RLE Deficits / Details: noted popping of knee and  knee flexed during stance       Communication   Communication: No difficulties  Cognition Arousal/Alertness: Awake/alert Behavior During Therapy: WFL for tasks assessed/performed Overall Cognitive Status: Impaired/Different from baseline Area of Impairment: Orientation Orientation Level: Time             General Comments: states, I don't knw what day it is-tues or wed?    General Comments      Exercises        Assessment/Plan    PT Assessment Patient needs continued PT services  PT Diagnosis Abnormality of gait   PT Problem List Decreased balance;Decreased mobility  PT Treatment Interventions Gait training;Balance training  PT Goals (Current goals can be found in the Care Plan section) Acute Rehab PT Goals Patient Stated Goal: I am going home one way or another. I have walked out before. I am raising 3 grandchildren and have to get home. PT Goal Formulation: With patient Time For Goal Achievement: 12/16/15 Potential to Achieve Goals: Good    Frequency Min 3X/week   Barriers to discharge        Co-evaluation               End of Session   Activity Tolerance: Patient tolerated treatment well Patient left: in bed;with call bell/phone within reach Nurse  Communication: Mobility status         Time: 1610-9604 PT Time Calculation (min) (ACUTE ONLY): 19 min   Charges:   PT Evaluation $PT Eval Low Complexity: 1 Procedure     PT G CodesRada Hay 12/09/2015, 9:01 AM Blanchard Kelch PT [phone]

## 2015-12-09 NOTE — Progress Notes (Signed)
Pt refused his 08:00 respiratory therapy medication. No distress noted at this time.

## 2015-12-09 NOTE — Discharge Summary (Addendum)
Physician Discharge Summary  Glen Lopez ZOX:[id number] DOB: 1955-11-05 DOA: 12/07/2015  PCP: Leanor Rubenstein, MD  Admit date: 12/07/2015 Discharge date: 12/09/2015  Recommendations for Outpatient Follow-up:  1. Psychiatry within 1-2 weeks of discharge for bipolar disorder with low lithium level 2. Cardiology for stress test on 2/21 and follow up on 3/1, already scheduled 3. Pulmonology for COPD in 2 weeks, particularly if becoming more SOB 4. PCP in 1-2 week to review blood pressure medication and repeat bloodwork.  F/u acute hepatitis panel.  CBC and CMP at next visit.  Discharge Diagnoses:  Principal Problem:   Acute encephalopathy Active Problems:   Essential hypertension, benign   Bipolar disorder, unspecified (HCC)   COPD exacerbation (HCC)   Elevated troponin   Discharge Condition: stable, improved  Diet recommendation: healthy heart  Wt Readings from Last 3 Encounters:  12/08/15 77.1 kg (169 lb 15.6 oz)  11/30/15 77.111 kg (170 lb)  11/27/15 78.472 kg (173 lb)    History of present illness:   Glen Lopez is a 60 y.o. male with history of COPD, chronic back pain and hypertension was brought to the ER the patient's son found last night the patient was minimally responsive. As per the patient's son patient was looking pale and diaphoretic and minimally responsive. Patient had to be given CPR and EMS was called. EMS gave patient Narcan and patient was brought to the ER. In the ER he was more responsive but still confused.  He had a leukocytosis and was hypotensive but responded to IVF.  Lactic acid was normal. Chest x-ray demonstrated bronchitic changes. UA was unremarkable. UDS was positive for opiates (prescribed), benzos (prescribed), and THC.  He admitted to taking more than his prescribed about of narcotic medication. Patient was wheezing and started on treatment for COPD exacerbation and admitted for observation.  Denied SI or intentional overdose.  Hospital Course:    Acute encephalopathy, resolved quickly after narcan administration.  He has been prescribed several sedating medications in the setting of COPD which may be causing transient hypoxemia.  Recommend stopping his muscle relaxant and narcotic pain medication.  He amy use ibuprofen for pain.  He may continue to use his xanax, but I encouraged him to gradually reduce the amount he takes from 1mg  to 0.5mg .  Acute COPD exacerbation, patient states he normally wheezes and does not feel more SOB than usual.  He was started on solumedrol, nebulizer treatments and antibiotics, which he took initially, then declined to take once he stated he felt back to his baseline.    Hypertension with hypotension at admission, improved.  Held antihypertensives which may need to be gradually resumed as outpatient.    Leukocytosis, likely reactive as UA and CXR were unremarkable.  Improved but did not return to normal, probably because of the administration of solumedrol for COPD.  Repeat a week after steroids completed.  Elevated LFTs, likely due to hypotension.  Tylenol level negative.  Acute hepatitis panel pending.  Not cholestatic and asymptomatic so doubt gallstones.  Improved with IVF.  Advised no tylenol use and repeat blood work in 1-2 weeks.   AKI due to hypotension, creatinine trended down from 1.3 to 0.9.    Elevated troponin, likely due to hypotension and chest compressions.  Peak of 0.34.  Seen by cardiology who felt he had some demand ischemia, but unlikely to have had arrhythmia or ACS since he was otherwise asymptomatic and without ECG changes.  ECHO demonstrated no regional wall motion abnl.  Follow  up with cardiology in 2 weeks or sooner as needed.    Procedures:  CXR  ECHO  Consultations:  Cardiology  Discharge Exam: Filed Vitals:   12/08/15 2251 12/09/15 0540  BP: 104/60 132/80  Pulse: 90 95  Temp: 97.6 F (36.4 C) 98.2 F (36.8 C)  Resp: 18 18   Filed Vitals:   12/08/15 1413 12/08/15  2024 12/08/15 2251 12/09/15 0540  BP: 94/49  104/60 132/80  Pulse: 90  90 95  Temp: 97.8 F (36.6 C)  97.6 F (36.4 C) 98.2 F (36.8 C)  TempSrc: Oral  Axillary Oral  Resp: 20  18 18   Height:      Weight:      SpO2: 94% 96% 96% 97%    General: adult male, NAD, asking to go home right away Cardiovascular: RRR, no mrg Respiratory: Moderate to high pitched wheezing, no rales or rhonchi, not dyspneic when talking, no respiratory distress ABD:  NABS, soft, NT/ND MSK:  No LEE, normal tone and bulk  Discharge Instructions      Discharge Instructions    Call MD for:  difficulty breathing, headache or visual disturbances    Complete by:  As directed      Call MD for:  extreme fatigue    Complete by:  As directed      Call MD for:  hives    Complete by:  As directed      Call MD for:  persistant dizziness or light-headedness    Complete by:  As directed      Call MD for:  persistant nausea and vomiting    Complete by:  As directed      Call MD for:  severe uncontrolled pain    Complete by:  As directed      Call MD for:  temperature >100.4    Complete by:  As directed      Diet - low sodium heart healthy    Complete by:  As directed      Discharge instructions    Complete by:  As directed   Please take prednisone and azithromycin for the next four days, your next doses are due tomorrow.  Follow up with Dr. Marchelle Gearing in about 2 weeks to check your breathing.  You have a heart stress test scheduled for later this month and a follow up appointment with cardiology on 3/1.  Please keep these appointments.  You need to STOP your flexeril and your oxycodone.  You may use ibuprofen for pain but not tylenol.  Talk to your primary care doctor or psychiatrist about cutting down somewhat on your xanax dose gradually.     Increase activity slowly    Complete by:  As directed             Medication List    STOP taking these medications        amLODipine 5 MG tablet  Commonly known as:   NORVASC     atenolol-chlorthalidone 100-25 MG tablet  Commonly known as:  TENORETIC     cyclobenzaprine 5 MG tablet  Commonly known as:  FLEXERIL     nystatin 100000 UNIT/ML suspension  Commonly known as:  MYCOSTATIN     oxyCODONE-acetaminophen 5-325 MG tablet  Commonly known as:  PERCOCET/ROXICET      TAKE these medications        albuterol 108 (90 Base) MCG/ACT inhaler  Commonly known as:  PROVENTIL HFA;VENTOLIN HFA  Inhale 2 puffs into the lungs every  4 (four) hours as needed. For shortness of breath     albuterol (2.5 MG/3ML) 0.083% nebulizer solution  Commonly known as:  PROVENTIL  Take 2.5 mg by nebulization every 6 (six) hours as needed. For shortness of breath     ALPRAZolam 1 MG tablet  Commonly known as:  XANAX  Take 1 mg by mouth 3 (three) times daily as needed for anxiety.     aspirin EC 81 MG tablet  Take 1 tablet (81 mg total) by mouth daily.     azithromycin 250 MG tablet  Commonly known as:  ZITHROMAX  Take 1 tablet (250 mg total) by mouth daily.     budesonide-formoterol 160-4.5 MCG/ACT inhaler  Commonly known as:  SYMBICORT  Inhale 2 puffs into the lungs 2 (two) times daily.     Fish Oil 500 MG Caps  Take 500 mg by mouth daily.     lithium carbonate 300 MG capsule  Take 300 mg by mouth 3 (three) times daily with meals.     multivitamin with minerals Tabs tablet  Take 1 tablet by mouth daily.     omeprazole 20 MG capsule  Commonly known as:  PRILOSEC  Take 20 mg by mouth daily as needed (for acid reflux/indigestion).     predniSONE 20 MG tablet  Commonly known as:  DELTASONE  Take 2 tablets (40 mg total) by mouth daily with breakfast.     Tiotropium Bromide Monohydrate 2.5 MCG/ACT Aers  Commonly known as:  SPIRIVA RESPIMAT  2 puffs each morning       Follow-up Information    Follow up with Abelino Derrick, PA-C On 12/30/2015.   Specialties:  Cardiology, Radiology   Why:  11:00 AM - Dr. Ludwig Clarks PA   Contact information:   [address] [name] [address] [phone]       Follow up with [facility] On 12/22/2015.   Why:  12:45 PM - For Stress Test.  Nothing to eat or drink after 6AM.   Contact information:   [address] [name] [address] [phone]      Follow up with [facility], MD. Schedule an appointment as soon as possible for a visit in 2 weeks.   Specialty:  Pulmonary Disease   Contact information:   [address] [name] [address] [phone]       Follow up with Leanor Rubenstein, MD. Schedule an appointment as soon as possible for a visit in 1 week.   Specialty:  Family Medicine   Contact information:   [phone] [name] [address] [phone]        The results of significant diagnostics from this hospitalization (including imaging, microbiology, ancillary and laboratory) are listed below for reference.    Significant Diagnostic Studies: Dg Cervical Spine Complete  11/30/2015  CLINICAL DATA:  Status post fall, neck pain, shoulder pain EXAM: CERVICAL SPINE - COMPLETE 4+ VIEW COMPARISON:  None. FINDINGS: There is no evidence of cervical spine fracture or prevertebral soft tissue swelling. Alignment is normal. No other significant bone abnormalities are identified. IMPRESSION: Negative cervical spine radiographs. 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 Scattered disc space narrowing and endplate spur formation. Vertebral body heights maintained without fracture or subluxation. No bone destruction or spondylolysis. SI joints preserved. Large calculus 16 mm diameter again identified at  the upper pole of the LEFT kidney, unchanged. IMPRESSION: Multilevel degenerative disc disease changes of the lumbar spine. No acute abnormalities. Large calculus 16 mm diameter at upper pole LEFT kidney. Electronically Signed   By: Ulyses Southward M.D.   On: 11/27/2015 17:20   Dg Shoulder Right  11/30/2015  CLINICAL DATA:  Fall, right shoulder injury EXAM: RIGHT SHOULDER - 2+ VIEW COMPARISON:  None. FINDINGS: There is no evidence of fracture or dislocation. There is no evidence of arthropathy or other focal bone abnormality. Soft tissues are unremarkable. IMPRESSION: Negative. Electronically Signed   By: Esperanza Heir M.D.   On: 11/30/2015 12:36   Dg Chest Port 1 View  12/08/2015  CLINICAL DATA:  Acute onset of vomiting. Status post overdose. Initial encounter. EXAM: PORTABLE CHEST 1 VIEW COMPARISON:  Chest radiograph performed 09/10/2015, and CT of the chest performed 11/03/2015 FINDINGS: The lungs are well-aerated. Mild vascular congestion is noted. Mild peribronchial thickening is seen. There is no evidence of focal opacification, pleural effusion or pneumothorax. The cardiomediastinal silhouette is within normal limits. No acute osseous abnormalities are seen. IMPRESSION: Mild vascular congestion noted.  Mild peribronchial thickening seen. Electronically Signed   By: Roanna Raider M.D.   On: 12/08/2015 01:39   Dg Hip Unilat W Or W/o Pelvis 2-3 Views Right  11/30/2015  CLINICAL DATA:  Right hip injury from fall; history previous it injury EXAM: DG HIP (WITH OR WITHOUT PELVIS) 2-3V RIGHT COMPARISON:  Hip series of November 27, 2015 FINDINGS: The bony pelvis is adequately mineralized. There is no acute pelvic fracture. The right hip exhibits mild narrowing of the joint space symmetrically. The articular surfaces remains smoothly rounded. The femoral neck, intertrochanteric, and subtrochanteric regions are normal. IMPRESSION: There is no acute bony abnormality of the right hip. There is very mild narrowing of  the right hip joint space consistent with osteoarthritis. Electronically Signed   By: David  [place] M.D.   On: 11/30/2015 12:30   Dg Hip Unilat W Or W/o Pelvis 2-3 Views Right  11/27/2015  CLINICAL DATA:  60 year old male with history of trauma from a fall on a stairway rolling down several steps. Pain in pelvis and right hip. EXAM: DG HIP (WITH OR WITHOUT PELVIS) 2-3V RIGHT COMPARISON:  No priors. FINDINGS: No acute displaced fractures of the bony pelvis. Bilateral proximal femurs as visualized are intact, and the right femoral head is properly located. Joint space narrowing, subchondral sclerosis come subchondral cyst formation and osteophyte formation is noted in the hip joints bilaterally, compatible with mild to moderate osteoarthritis. IMPRESSION: 1. No acute radiographic abnormality of the bony pelvis or right hip. 2. Mild-to-moderate bilateral hip joint osteoarthritis. Electronically Signed   By: Trudie Reed M.D.   On: 11/27/2015 17:23    Microbiology: Recent Results (from the past 240 hour(s))  Blood Culture (routine x 2)     Status: None (Preliminary result)   Collection Time: 12/08/15 12:26 AM  Result Value Ref Range Status   Specimen Description BLOOD LEFT ANTECUBITAL  Final   Special Requests   Final    BOTTLES DRAWN AEROBIC AND ANAEROBIC Performed at [facility]    Culture PENDING  Incomplete   Report Status PENDING  Incomplete  Blood Culture (routine x 2)  Status: None (Preliminary result)   Collection Time: 12/08/15  1:06 AM  Result Value Ref Range Status   Specimen Description BLOOD LEFT HAND  Final   Special Requests   Final    BOTTLES DRAWN AEROBIC ONLY 5CC Performed at [facility]    Culture PENDING  Incomplete   Report Status PENDING  Incomplete  Urine culture     Status: None   Collection Time: 12/08/15  2:00 AM  Result Value Ref Range Status   Specimen Description URINE, CLEAN CATCH  Final   Special Requests NONE  Final   Culture    Final    NO GROWTH 1 DAY Performed at [facility]    Report Status 12/09/2015 FINAL  Final     Labs: Basic Metabolic Panel:  Recent Labs Lab 12/07/15 2352 12/08/15 0403 12/08/15 0551 12/09/15 0557  NA 141 144 143 139  K 4.4 4.0 4.3 4.7  CL 103 111 110 109  CO2 GLUCOSE 138* 86 104* 176*  BUN 27* 24* 23* 17  CREATININE 1.74* 1.31* 1.32* 0.94  CALCIUM 8.7* 7.6* 8.2* 8.4*   Liver Function Tests:  Recent Labs Lab 12/07/15 2352 12/08/15 0403 12/08/15 0551 12/09/15 0821  AST 362* 178* 172* 55*  ALT 341* 218* 234* 153*  ALKPHOS 91 59 63 56  BILITOT 0.8 0.6 0.9 0.3  PROT 8.1 5.6* 6.2* 6.2*  ALBUMIN 4.5 3.2* 3.5 3.4*   No results for input(s): LIPASE, AMYLASE in the last 168 hours. No results for input(s): AMMONIA in the last 168 hours. CBC:  Recent Labs Lab 12/07/15 2352 12/08/15 0551 12/09/15 0557  WBC 34.9* 16.3* 16.3*  NEUTROABS  --  15.3*  --   HGB 17.7* 14.5 14.0  HCT 53.3* 45.0 43.6  MCV 99.8 99.8 99.5  PLT 162 119* 111*   Cardiac Enzymes:  Recent Labs Lab 12/08/15 0551 12/08/15 1144 12/08/15 1802  TROPONINI 0.34* 0.21* 0.13*   BNP: BNP (last 3 results) No results for input(s): BNP in the last 8760 hours.  ProBNP (last 3 results) No results for input(s): PROBNP in the last 8760 hours.  CBG:  Recent Labs Lab 12/07/15 2342  GLUCAP 150*    Time coordinating discharge: 35 minutes  Signed:  Elise Knobloch  [facility] 12/09/2015, 12:22 PM

## 2015-12-10 LAB — HEPATITIS PANEL, ACUTE
HCV Ab: <0.1 s/co ratio (ref 0.0–0.9)
Hep A IgM: NEGATIVE
Hep B C IgM: NEGATIVE
Hepatitis B Surface Ag: NEGATIVE

## 2015-12-13 LAB — CULTURE, BLOOD (ROUTINE X 2)
CULTURE: NO GROWTH
Culture: NO GROWTH

## 2015-12-17 ENCOUNTER — Telehealth (HOSPITAL_COMMUNITY): Payer: Self-pay

## 2015-12-17 NOTE — Telephone Encounter (Signed)
I did transfer the call to Deedie to reschedule.

## 2015-12-18 ENCOUNTER — Telehealth (HOSPITAL_COMMUNITY): Payer: Self-pay

## 2015-12-18 DIAGNOSIS — R740 Nonspecific elevation of levels of transaminase and lactic acid dehydrogenase [LDH]: Secondary | ICD-10-CM | POA: Diagnosis not present

## 2015-12-18 DIAGNOSIS — J449 Chronic obstructive pulmonary disease, unspecified: Secondary | ICD-10-CM | POA: Diagnosis not present

## 2015-12-18 DIAGNOSIS — I10 Essential (primary) hypertension: Secondary | ICD-10-CM | POA: Diagnosis not present

## 2015-12-18 DIAGNOSIS — M545 Low back pain: Secondary | ICD-10-CM | POA: Diagnosis not present

## 2015-12-18 DIAGNOSIS — G9340 Encephalopathy, unspecified: Secondary | ICD-10-CM | POA: Diagnosis not present

## 2015-12-18 DIAGNOSIS — F319 Bipolar disorder, unspecified: Secondary | ICD-10-CM | POA: Diagnosis not present

## 2015-12-18 NOTE — Telephone Encounter (Signed)
Encounter complete. 

## 2015-12-22 ENCOUNTER — Inpatient Hospital Stay (HOSPITAL_COMMUNITY): Admit: 2015-12-22 | Payer: Commercial Managed Care - HMO

## 2015-12-24 DIAGNOSIS — R630 Anorexia: Secondary | ICD-10-CM | POA: Diagnosis not present

## 2015-12-24 DIAGNOSIS — R251 Tremor, unspecified: Secondary | ICD-10-CM | POA: Diagnosis not present

## 2015-12-24 DIAGNOSIS — F419 Anxiety disorder, unspecified: Secondary | ICD-10-CM | POA: Diagnosis not present

## 2015-12-24 DIAGNOSIS — K2970 Gastritis, unspecified, without bleeding: Secondary | ICD-10-CM | POA: Diagnosis not present

## 2015-12-24 DIAGNOSIS — F17210 Nicotine dependence, cigarettes, uncomplicated: Secondary | ICD-10-CM | POA: Diagnosis not present

## 2015-12-24 DIAGNOSIS — M545 Low back pain: Secondary | ICD-10-CM | POA: Diagnosis not present

## 2015-12-30 ENCOUNTER — Ambulatory Visit: Payer: Commercial Managed Care - HMO | Admitting: Cardiology

## 2015-12-31 ENCOUNTER — Telehealth (HOSPITAL_COMMUNITY): Payer: Self-pay | Admitting: *Deleted

## 2015-12-31 DIAGNOSIS — R0602 Shortness of breath: Secondary | ICD-10-CM | POA: Diagnosis not present

## 2015-12-31 DIAGNOSIS — J449 Chronic obstructive pulmonary disease, unspecified: Secondary | ICD-10-CM | POA: Diagnosis not present

## 2015-12-31 DIAGNOSIS — J438 Other emphysema: Secondary | ICD-10-CM | POA: Diagnosis not present

## 2015-12-31 NOTE — Telephone Encounter (Signed)
Left message on voicemail in reference to upcoming appointment scheduled for 01/05/16. Phone number given for a call back so details instructions can be given. Sakura Denis J Rhonna Holster, RN  

## 2016-01-05 ENCOUNTER — Telehealth (HOSPITAL_COMMUNITY): Payer: Self-pay | Admitting: *Deleted

## 2016-01-05 ENCOUNTER — Encounter (HOSPITAL_COMMUNITY): Payer: Commercial Managed Care - HMO

## 2016-01-05 NOTE — Telephone Encounter (Signed)
Patient was a no show for his myoview appointment on 01/05/16. Called and left message for patient to call back to reschedule appointment. Antionette CharMary J Kynnedi Zweig, RN

## 2016-01-12 ENCOUNTER — Encounter: Payer: Self-pay | Admitting: *Deleted

## 2016-01-12 ENCOUNTER — Ambulatory Visit: Payer: Commercial Managed Care - HMO | Admitting: Physician Assistant

## 2016-01-18 DIAGNOSIS — F1120 Opioid dependence, uncomplicated: Secondary | ICD-10-CM | POA: Diagnosis not present

## 2016-01-20 DIAGNOSIS — F1120 Opioid dependence, uncomplicated: Secondary | ICD-10-CM | POA: Diagnosis not present

## 2016-01-27 DIAGNOSIS — F329 Major depressive disorder, single episode, unspecified: Secondary | ICD-10-CM | POA: Diagnosis not present

## 2016-01-27 DIAGNOSIS — F1120 Opioid dependence, uncomplicated: Secondary | ICD-10-CM | POA: Diagnosis not present

## 2016-01-31 DIAGNOSIS — R0602 Shortness of breath: Secondary | ICD-10-CM | POA: Diagnosis not present

## 2016-01-31 DIAGNOSIS — J438 Other emphysema: Secondary | ICD-10-CM | POA: Diagnosis not present

## 2016-01-31 DIAGNOSIS — J449 Chronic obstructive pulmonary disease, unspecified: Secondary | ICD-10-CM | POA: Diagnosis not present

## 2016-02-01 DIAGNOSIS — F1120 Opioid dependence, uncomplicated: Secondary | ICD-10-CM | POA: Diagnosis not present

## 2016-02-08 DIAGNOSIS — F1120 Opioid dependence, uncomplicated: Secondary | ICD-10-CM | POA: Diagnosis not present

## 2016-02-15 DIAGNOSIS — F1120 Opioid dependence, uncomplicated: Secondary | ICD-10-CM | POA: Diagnosis not present

## 2016-02-18 ENCOUNTER — Ambulatory Visit: Payer: Commercial Managed Care - HMO | Admitting: Internal Medicine

## 2016-02-29 DIAGNOSIS — F1120 Opioid dependence, uncomplicated: Secondary | ICD-10-CM | POA: Diagnosis not present

## 2016-03-01 DIAGNOSIS — R0602 Shortness of breath: Secondary | ICD-10-CM | POA: Diagnosis not present

## 2016-03-01 DIAGNOSIS — J449 Chronic obstructive pulmonary disease, unspecified: Secondary | ICD-10-CM | POA: Diagnosis not present

## 2016-03-01 DIAGNOSIS — J438 Other emphysema: Secondary | ICD-10-CM | POA: Diagnosis not present

## 2016-03-22 DIAGNOSIS — I10 Essential (primary) hypertension: Secondary | ICD-10-CM | POA: Diagnosis not present

## 2016-03-22 DIAGNOSIS — M545 Low back pain: Secondary | ICD-10-CM | POA: Diagnosis not present

## 2016-03-22 DIAGNOSIS — F1121 Opioid dependence, in remission: Secondary | ICD-10-CM | POA: Diagnosis not present

## 2016-03-22 DIAGNOSIS — F319 Bipolar disorder, unspecified: Secondary | ICD-10-CM | POA: Diagnosis not present

## 2016-03-22 DIAGNOSIS — J449 Chronic obstructive pulmonary disease, unspecified: Secondary | ICD-10-CM | POA: Diagnosis not present

## 2016-04-01 DIAGNOSIS — R0602 Shortness of breath: Secondary | ICD-10-CM | POA: Diagnosis not present

## 2016-04-01 DIAGNOSIS — J449 Chronic obstructive pulmonary disease, unspecified: Secondary | ICD-10-CM | POA: Diagnosis not present

## 2016-04-01 DIAGNOSIS — J438 Other emphysema: Secondary | ICD-10-CM | POA: Diagnosis not present

## 2016-04-25 DIAGNOSIS — F1120 Opioid dependence, uncomplicated: Secondary | ICD-10-CM | POA: Diagnosis not present

## 2016-05-02 DIAGNOSIS — S7001XA Contusion of right hip, initial encounter: Secondary | ICD-10-CM | POA: Diagnosis not present

## 2016-05-15 IMAGING — CR DG SHOULDER 2+V*R*
3 series · 3 of 3 positions shown · non-contrast
Comparison: None.

CLINICAL DATA: Fall, right shoulder injury

EXAM:
RIGHT SHOULDER - 2+ VIEW

[AP]
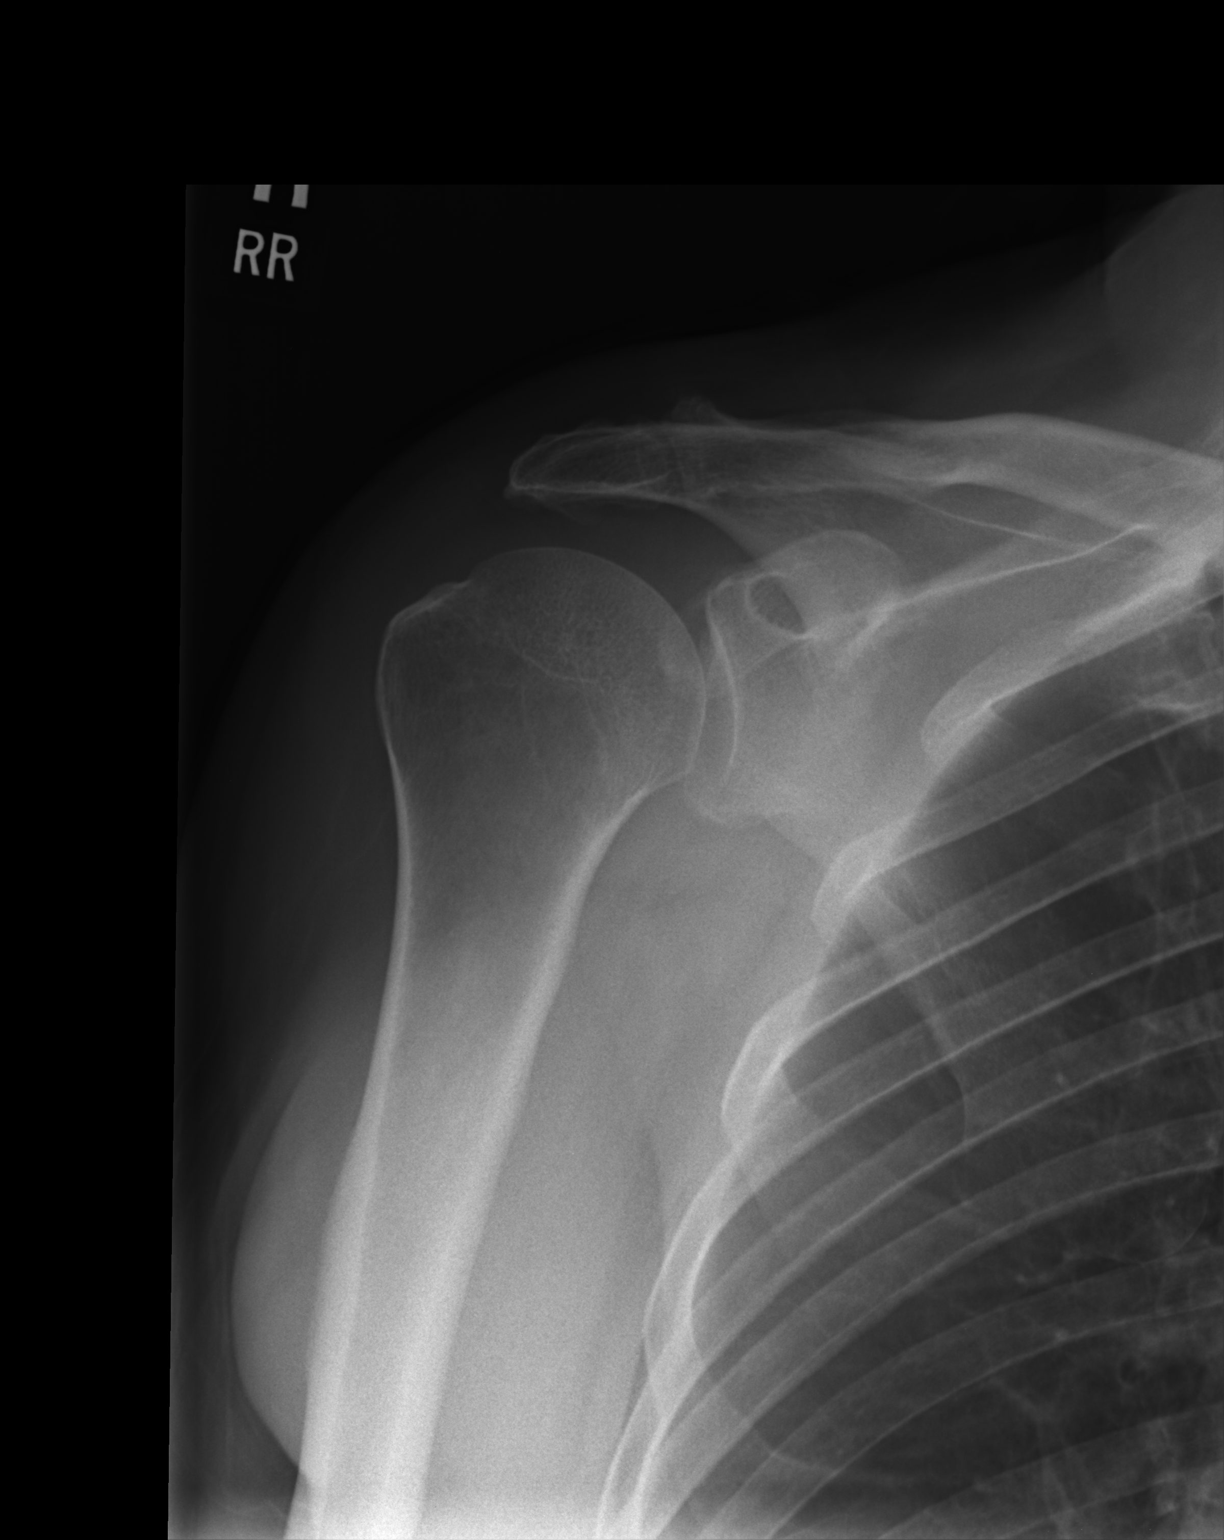

[pa y view]
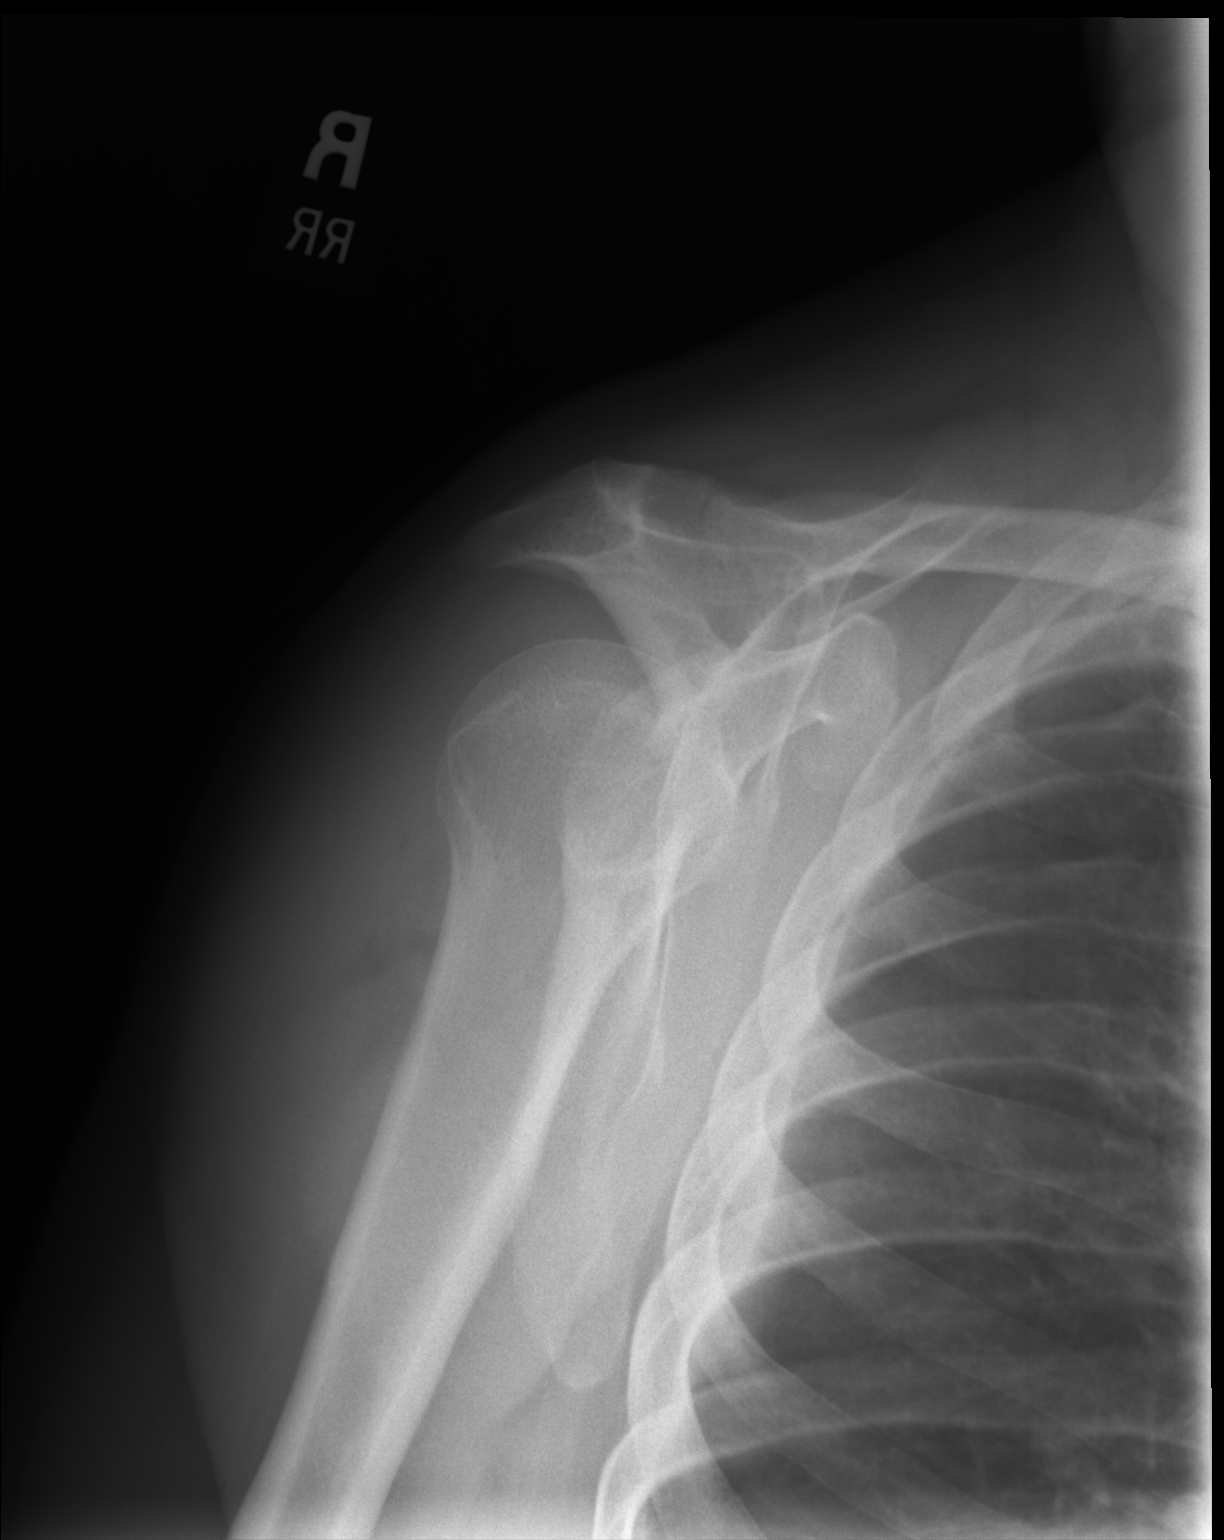

[axial]
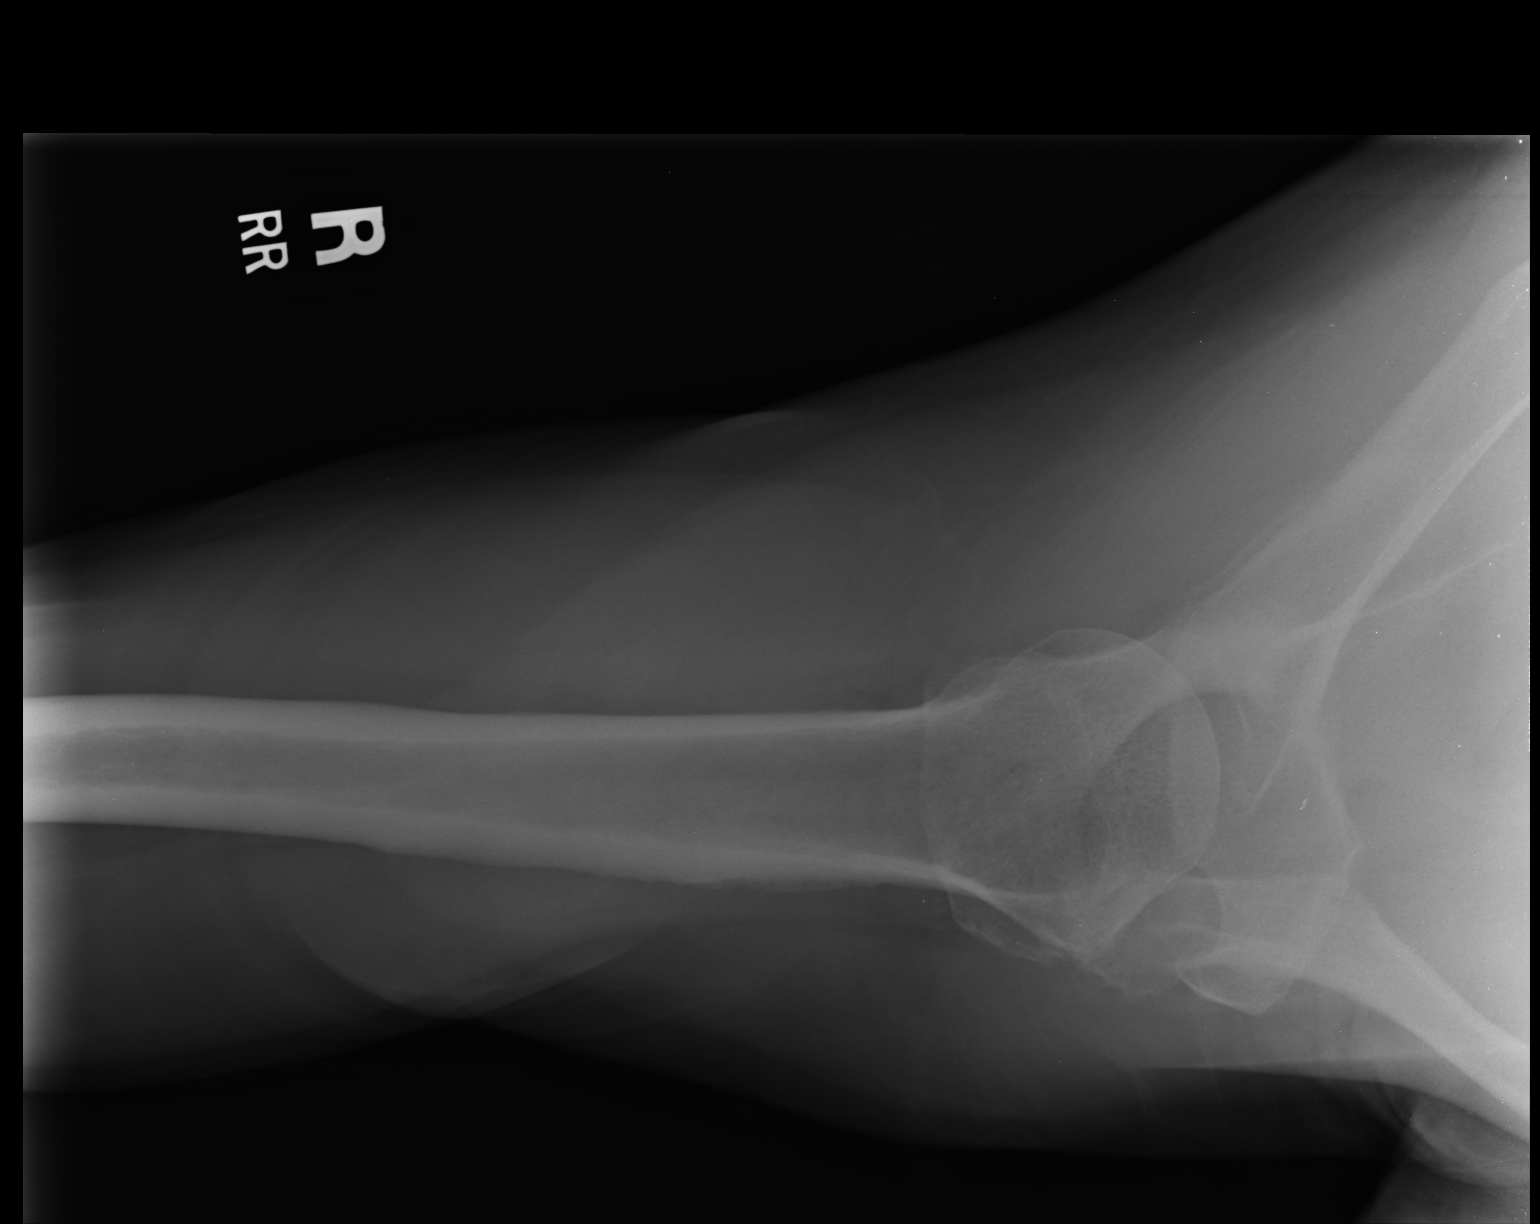

[3 of 3 positions shown; findings below may reference images not displayed]

FINDINGS: There is no evidence of fracture or dislocation. There is no
evidence of arthropathy or other focal bone abnormality. Soft
tissues are unremarkable.
IMPRESSION: Negative.

## 2016-05-15 IMAGING — CR DG CERVICAL SPINE COMPLETE 4+V
5 series · 5 of 5 positions shown · non-contrast
Comparison: None.

CLINICAL DATA: Status post fall, neck pain, shoulder pain

EXAM:
CERVICAL SPINE - COMPLETE 4+ VIEW

[lpo]
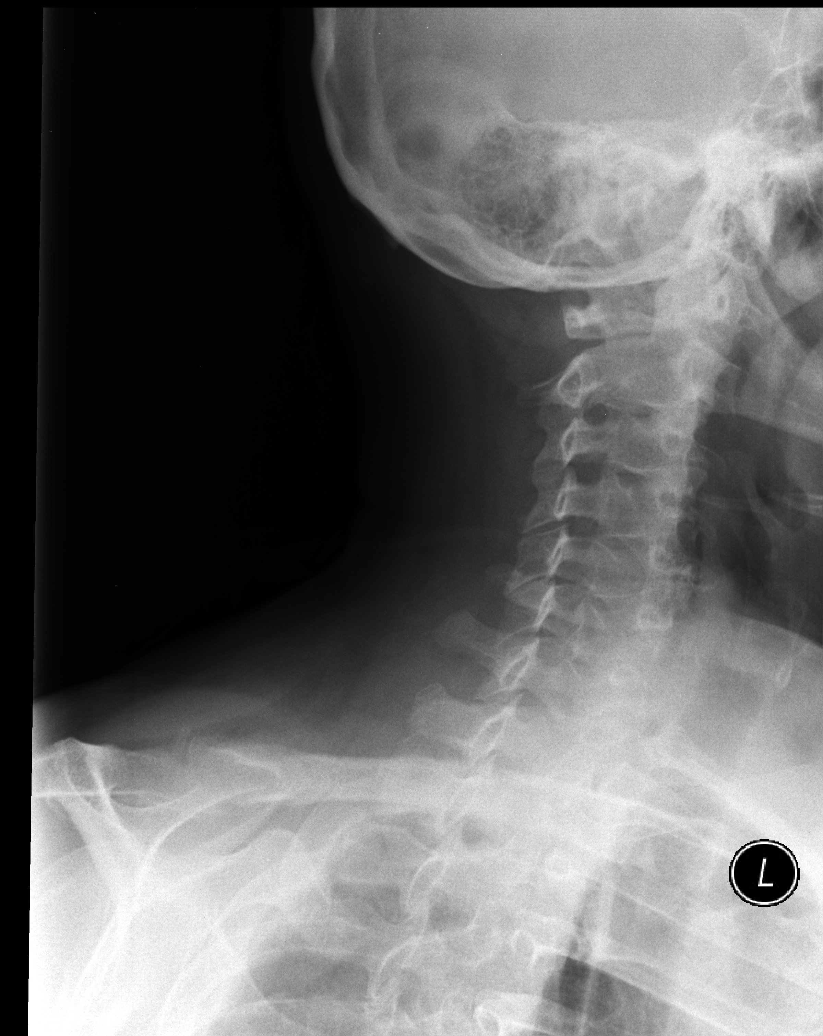

[lateral]
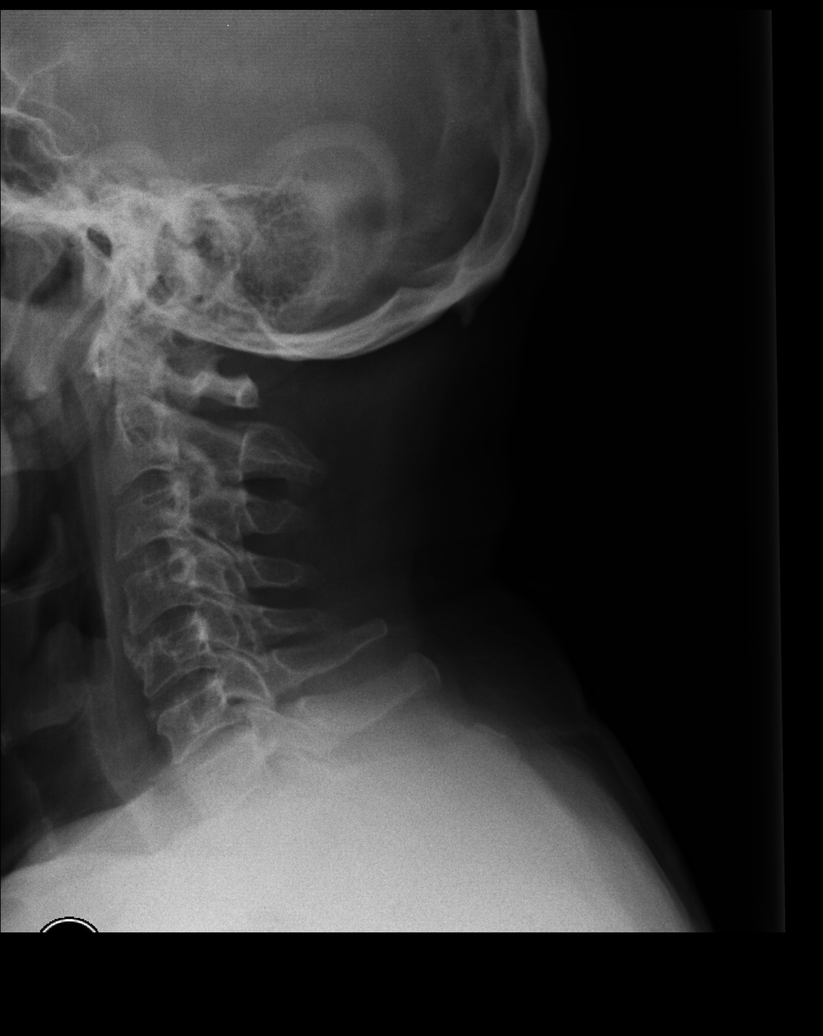

[rpo]
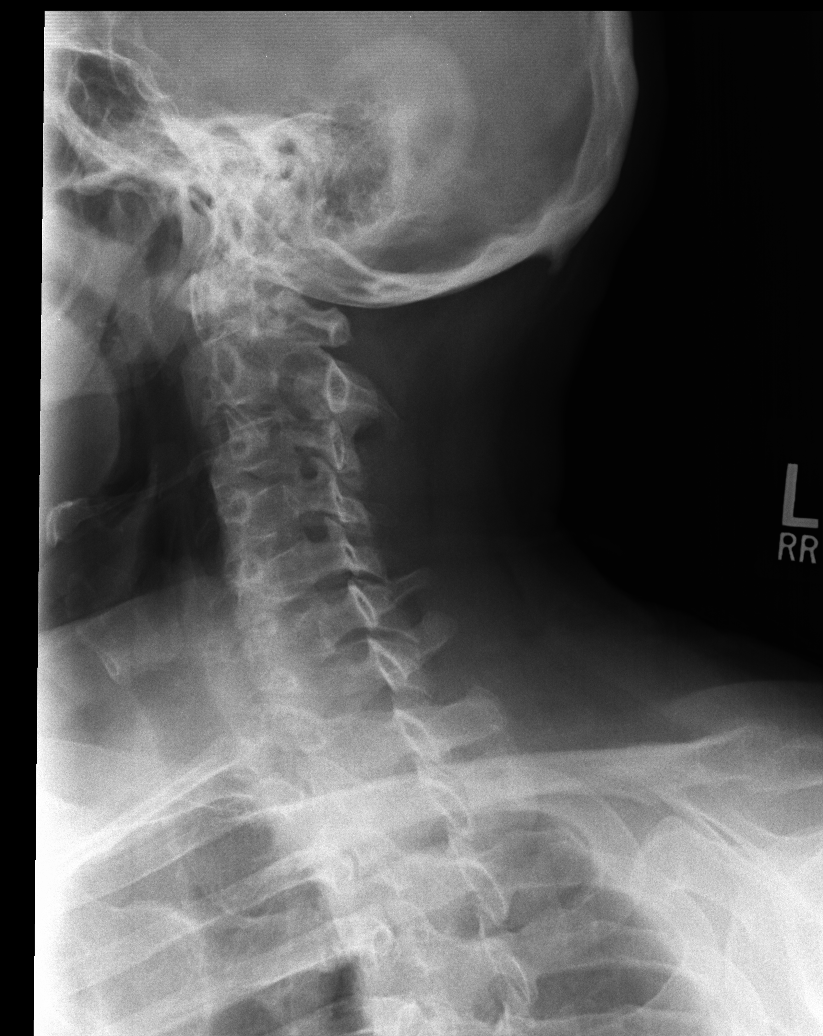

[AP]
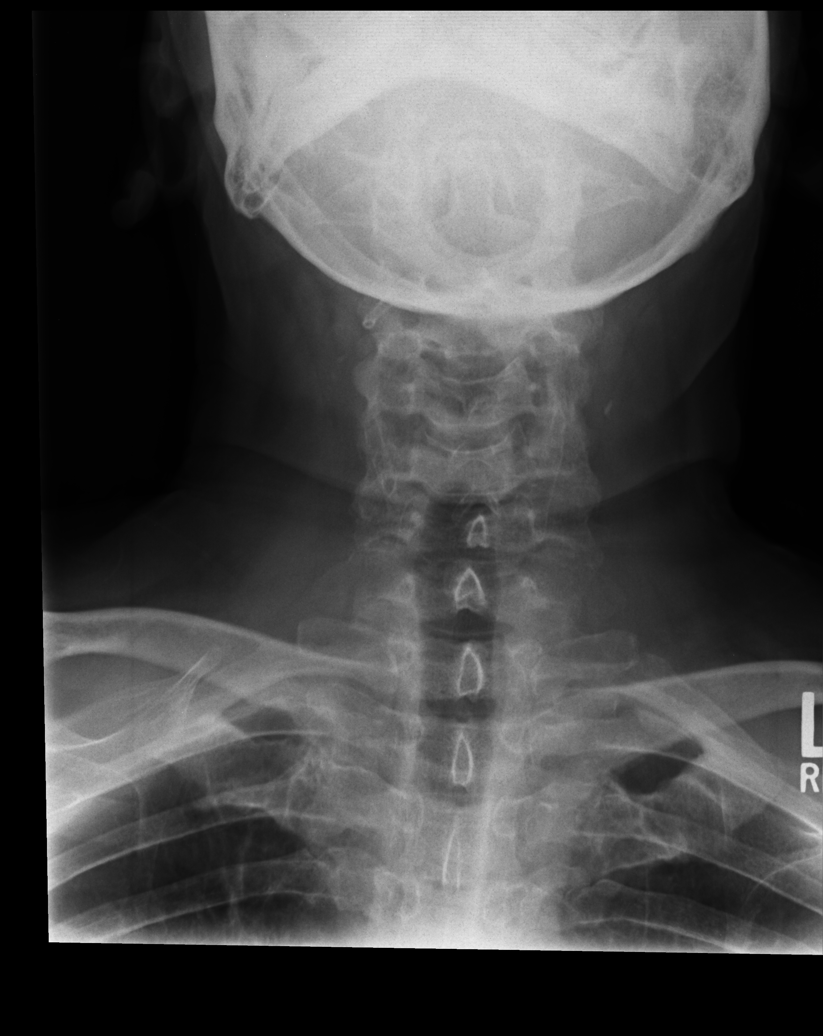

[ap open mouth]
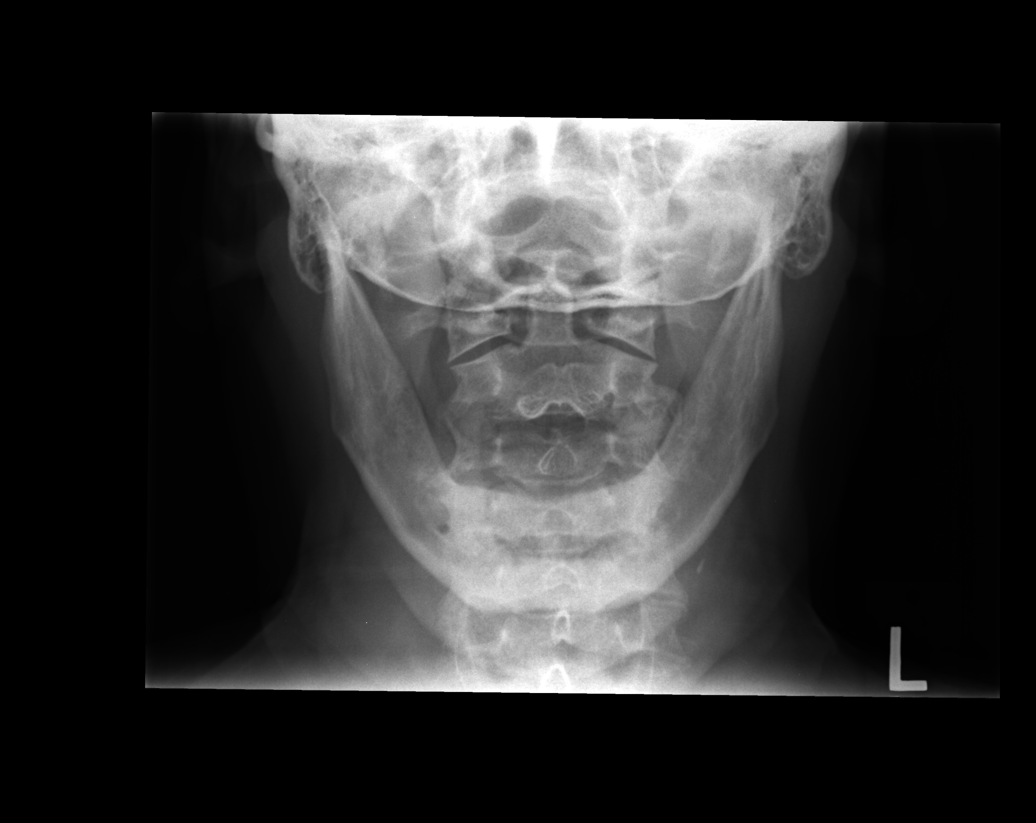

[5 of 5 positions shown; findings below may reference images not displayed]

FINDINGS: There is no evidence of cervical spine fracture or prevertebral soft
tissue swelling. Alignment is normal. No other significant bone
abnormalities are identified.
IMPRESSION: Negative cervical spine radiographs.

## 2016-05-18 ENCOUNTER — Ambulatory Visit: Payer: Commercial Managed Care - HMO | Admitting: Internal Medicine

## 2016-05-23 IMAGING — DX DG CHEST 1V PORT
1 series · 2 of 2 positions shown · non-contrast
Comparison: Chest radiograph performed 09/10/2015, and CT of the
chest performed 11/03/2015

CLINICAL DATA: Acute onset of vomiting. Status post overdose.
Initial encounter.

EXAM:
PORTABLE CHEST 1 VIEW

[Series 1: chest ap · 0.14mm/px · 2 of 2 slices shown]
[im 1/2]
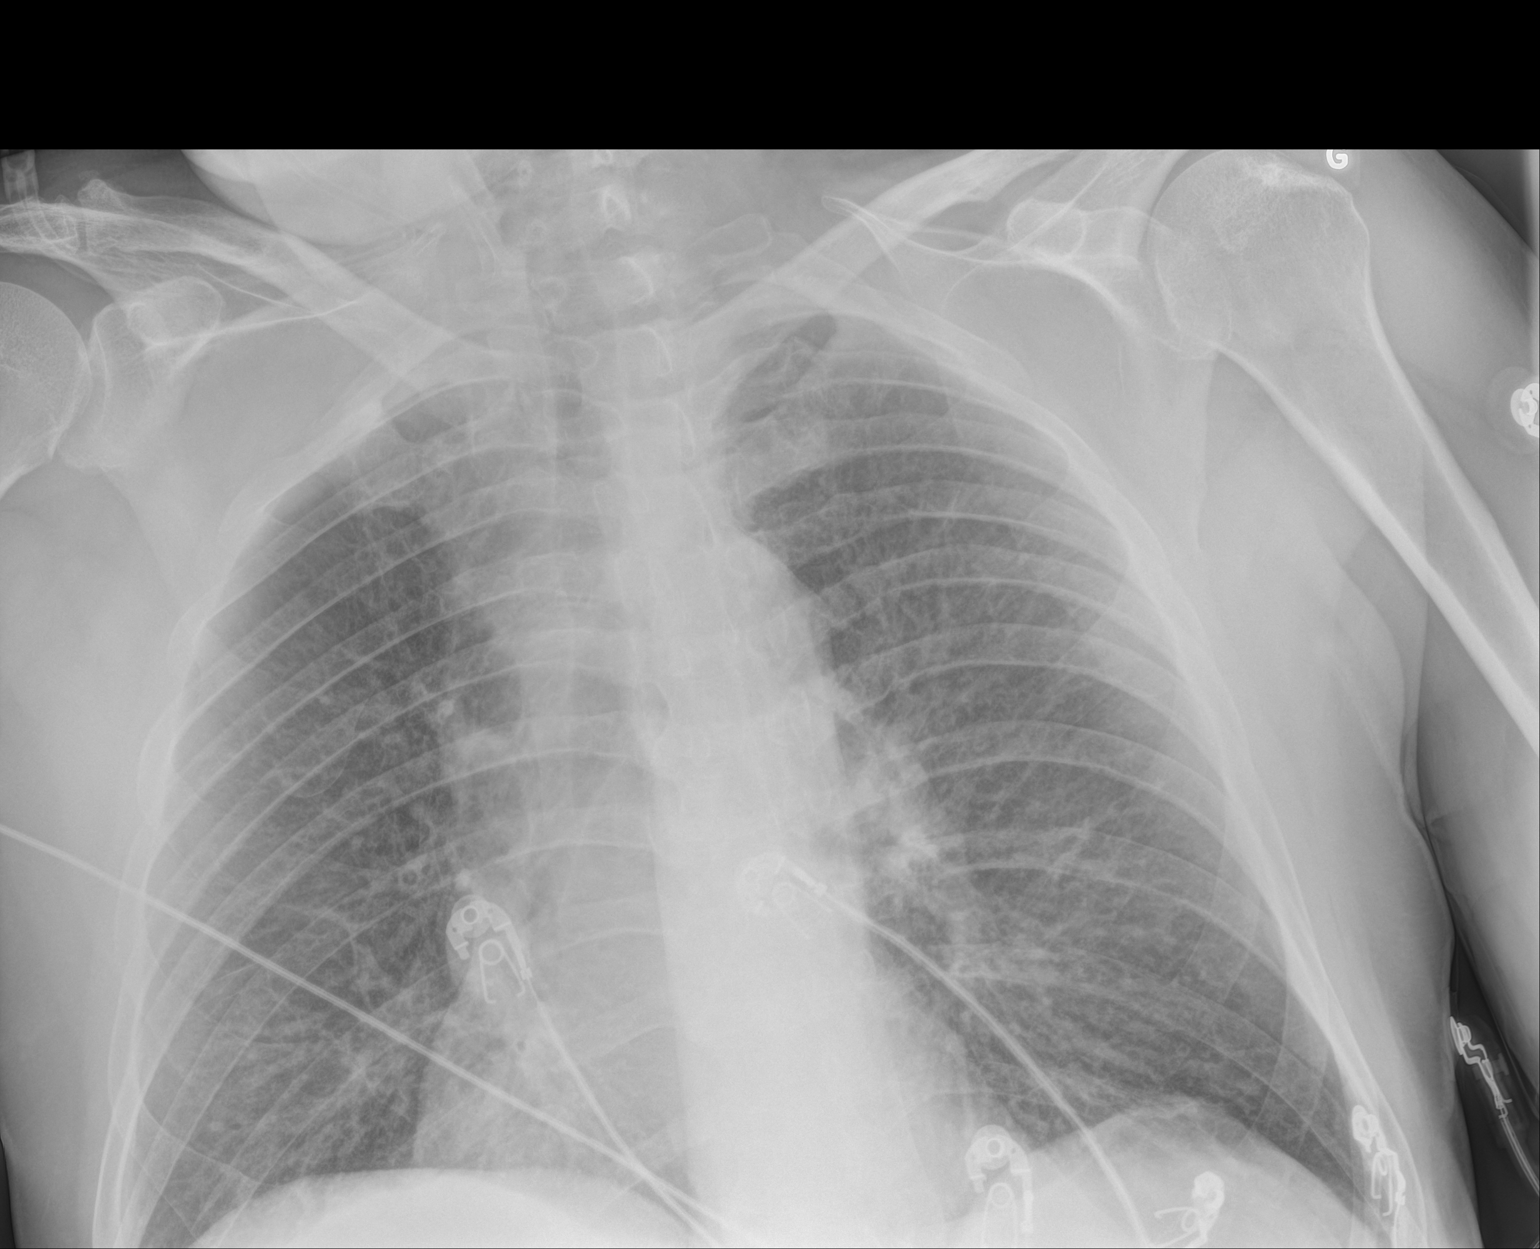
[im 2/2]
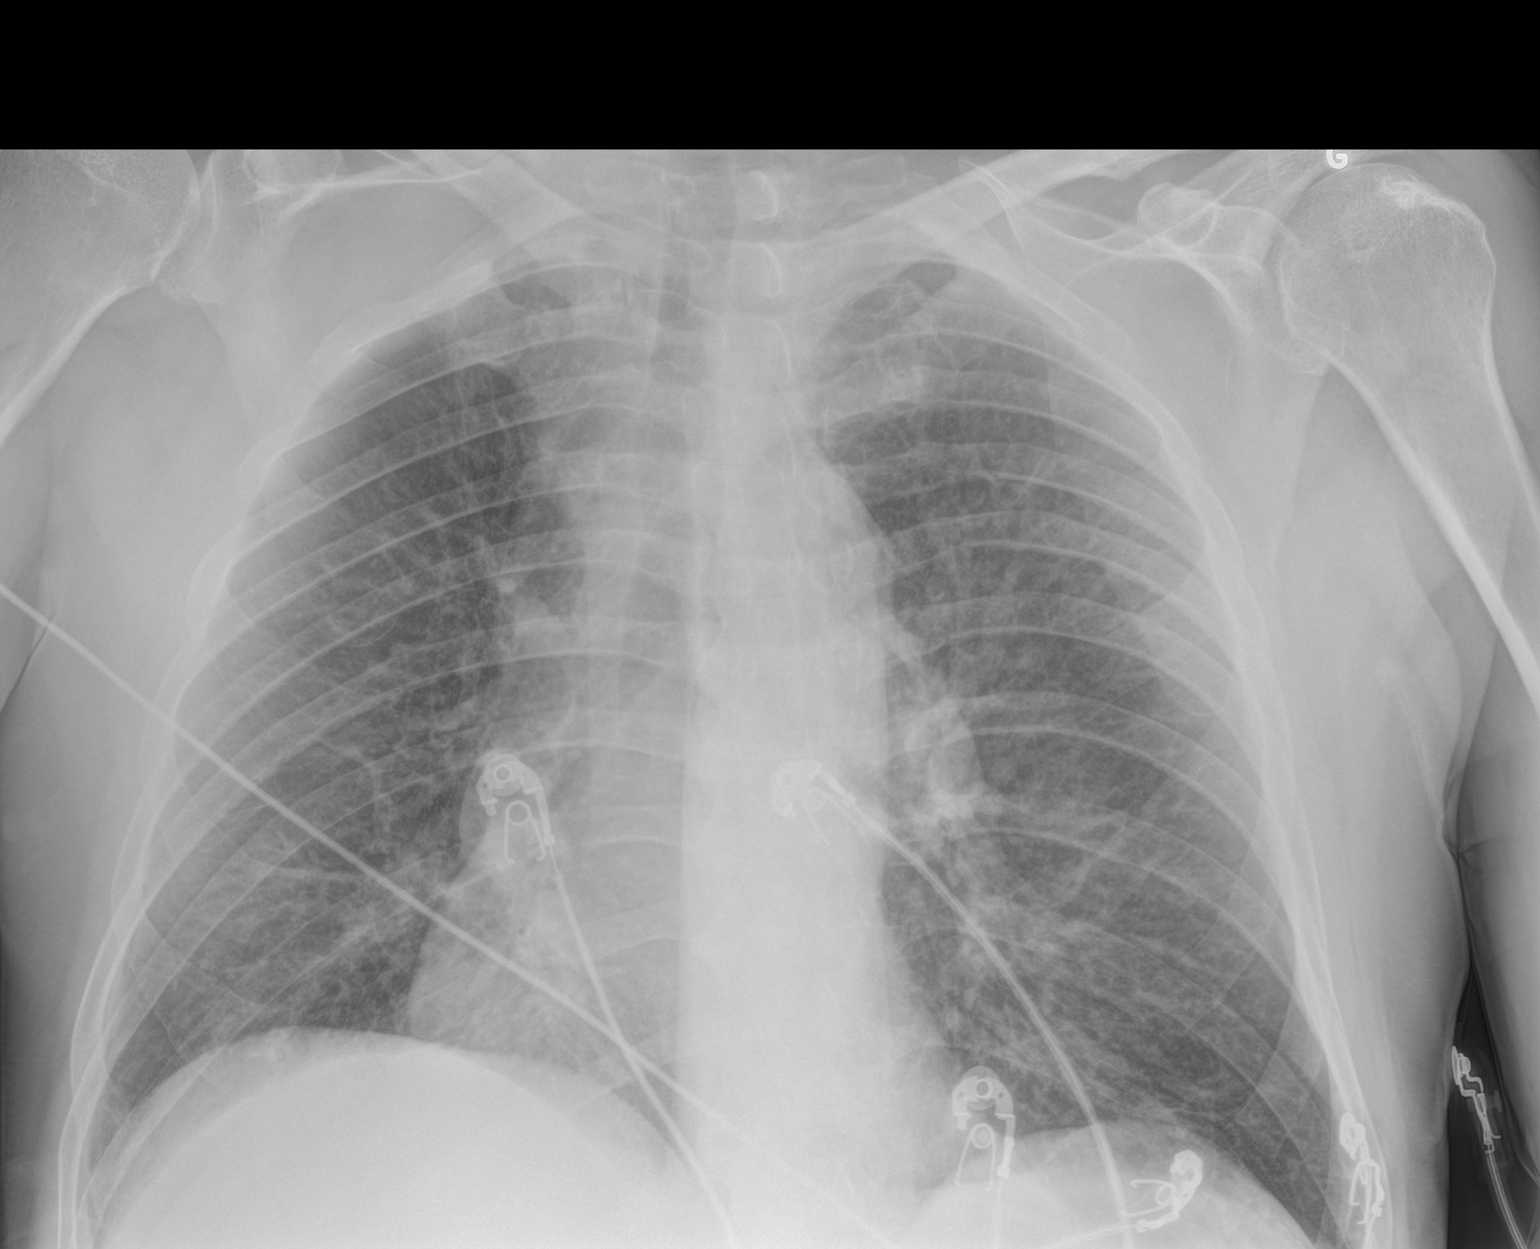

[2 of 2 positions shown; findings below may reference images not displayed]

FINDINGS: The lungs are well-aerated. Mild vascular congestion is noted. Mild
peribronchial thickening is seen. There is no evidence of focal
opacification, pleural effusion or pneumothorax.

The cardiomediastinal silhouette is within normal limits. No acute
osseous abnormalities are seen.
IMPRESSION: Mild vascular congestion noted.  Mild peribronchial thickening seen.

## 2016-05-26 ENCOUNTER — Ambulatory Visit: Payer: Commercial Managed Care - HMO | Admitting: Internal Medicine

## 2016-06-01 DIAGNOSIS — J449 Chronic obstructive pulmonary disease, unspecified: Secondary | ICD-10-CM | POA: Diagnosis not present

## 2016-06-01 DIAGNOSIS — R0602 Shortness of breath: Secondary | ICD-10-CM | POA: Diagnosis not present

## 2016-06-01 DIAGNOSIS — J438 Other emphysema: Secondary | ICD-10-CM | POA: Diagnosis not present

## 2016-07-02 DIAGNOSIS — J438 Other emphysema: Secondary | ICD-10-CM | POA: Diagnosis not present

## 2016-07-02 DIAGNOSIS — R0602 Shortness of breath: Secondary | ICD-10-CM | POA: Diagnosis not present

## 2016-07-02 DIAGNOSIS — J449 Chronic obstructive pulmonary disease, unspecified: Secondary | ICD-10-CM | POA: Diagnosis not present

## 2016-07-14 ENCOUNTER — Encounter: Payer: Self-pay | Admitting: Internal Medicine

## 2016-07-14 ENCOUNTER — Ambulatory Visit (INDEPENDENT_AMBULATORY_CARE_PROVIDER_SITE_OTHER): Payer: Commercial Managed Care - HMO | Admitting: Internal Medicine

## 2016-07-14 VITALS — BP 138/74 | HR 66 | Ht 65.0 in | Wt 163.0 lb

## 2016-07-14 DIAGNOSIS — J449 Chronic obstructive pulmonary disease, unspecified: Secondary | ICD-10-CM | POA: Insufficient documentation

## 2016-07-14 DIAGNOSIS — Z23 Encounter for immunization: Secondary | ICD-10-CM | POA: Diagnosis not present

## 2016-07-14 DIAGNOSIS — R0982 Postnasal drip: Secondary | ICD-10-CM | POA: Insufficient documentation

## 2016-07-14 DIAGNOSIS — J441 Chronic obstructive pulmonary disease with (acute) exacerbation: Secondary | ICD-10-CM | POA: Diagnosis not present

## 2016-07-14 MED ORDER — FLUTICASONE PROPIONATE 50 MCG/ACT NA SUSP: 2.0000 | Freq: Every day | NASAL | 5 refills | Status: DC

## 2016-07-14 MED ORDER — PREDNISONE 10 MG PO TABS: ORAL_TABLET | ORAL | 0 refills | Status: DC

## 2016-07-14 MED ORDER — DOXYCYCLINE HYCLATE 100 MG PO TABS: ORAL_TABLET | ORAL | 0 refills | Status: DC

## 2016-07-14 NOTE — Progress Notes (Signed)
Subjective:     Patient ID: Glen Lopez, male   DOB: 1956-08-14, 60 y.o.   MRN: [id number]  HPI  OV 07/14/2016  Chief Complaint  Patient presents with  . Follow-up    9 month COPD follow up - reports breathing is near baseline with the change of seasons.    Fu copd - mod/severe with CT features of MAI early 2017 Fu smoking  Follows with ? Wife. Wants flu shot. Wants med refills. Cotninues to smoke; unable to quit. In feb 2017 says admitted for ae-copd and this scared him. Says now for months increased wheeze and yellow sputum and also cough with sinus drainage. He is off opioids. Does not want to go back to opioids due to addiction but cough is worse.     has a past medical history of Anxiety; Arthritis; Asthma; Bipolar disorder (HCC); COPD (chronic obstructive pulmonary disease) (HCC); Depression; GERD (gastroesophageal reflux disease); Headache(784.0); Hiatal hernia; and Hypertension.   reports that he has been smoking Cigarettes.  He has a 48.00 pack-year smoking history. He has never used smokeless tobacco.  Past Surgical History:  Procedure Laterality Date  . artheroscopic knee surgery R Right 01/2013    Allergies  Allergen Reactions  . Codeine Nausea And Vomiting    Immunization History  Administered Date(s) Administered  . Influenza,inj,Quad PF,36+ Mos 09/09/2015  . Influenza-Unspecified 07/31/2014  . Pneumococcal Polysaccharide-23 10/31/2013    Family History  Problem Relation Age of Onset  . Heart disease Father     MI at age 10  . Asthma Sister      Current Outpatient Prescriptions:  .  albuterol (PROVENTIL HFA;VENTOLIN HFA) 108 (90 BASE) MCG/ACT inhaler, Inhale 2 puffs into the lungs every 4 (four) hours as needed. For shortness of breath, Disp: , Rfl:  .  albuterol (PROVENTIL) (2.5 MG/3ML) 0.083% nebulizer solution, Take 2.5 mg by nebulization every 6 (six) hours as needed. For shortness of breath, Disp: , Rfl:  .  ALPRAZolam (XANAX) 1 MG tablet, Take  1 mg by mouth 3 (three) times daily as needed for anxiety. , Disp: , Rfl:  .  aspirin EC 81 MG tablet, Take 1 tablet (81 mg total) by mouth daily., Disp: , Rfl:  .  atenolol-chlorthalidone (TENORETIC) 100-25 MG tablet, Take 1 tablet by mouth daily., Disp: , Rfl:  .  budesonide-formoterol (SYMBICORT) 160-4.5 MCG/ACT inhaler, Inhale 2 puffs into the lungs 2 (two) times daily., Disp: , Rfl:  .  lithium carbonate 300 MG capsule, Take 300 mg by mouth 3 (three) times daily with meals., Disp: , Rfl:  .  Multiple Vitamin (MULTIVITAMIN WITH MINERALS) TABS tablet, Take 1 tablet by mouth daily., Disp: , Rfl:  .  Omega-3 Fatty Acids (FISH OIL) 500 MG CAPS, Take 500 mg by mouth daily., Disp: , Rfl:  .  omeprazole (PRILOSEC) 20 MG capsule, Take 20 mg by mouth daily as needed (for acid reflux/indigestion)., Disp: , Rfl:  .  Tiotropium Bromide Monohydrate (SPIRIVA RESPIMAT) 2.5 MCG/ACT AERS, 2 puffs each morning, Disp: 4 g, Rfl: 11    Review of Systems     Objective:   Physical Exam  Constitutional: He is oriented to person, place, and time. He appears well-developed and well-nourished. No distress.  HENT:  Head: Normocephalic and atraumatic.  Right Ear: External ear normal.  Left Ear: External ear normal.  Mouth/Throat: Oropharynx is clear and moist. No oropharyngeal exudate.  Eyes: Conjunctivae and EOM are normal. Pupils are equal, round, and reactive to light. Right  eye exhibits no discharge. Left eye exhibits no discharge. No scleral icterus.  Neck: Normal range of motion. Neck supple. No JVD present. No tracheal deviation present. No thyromegaly present.  Cardiovascular: Normal rate, regular rhythm and intact distal pulses.  Exam reveals no gallop and no friction rub.   No murmur heard. Pulmonary/Chest: Effort normal. No respiratory distress. He has wheezes. He has no rales. He exhibits no tenderness.  Abdominal: Soft. Bowel sounds are normal. He exhibits no distension and no mass. There is no  tenderness. There is no rebound and no guarding.  Musculoskeletal: Normal range of motion. He exhibits no edema or tenderness.  Lymphadenopathy:    He has no cervical adenopathy.  Neurological: He is alert and oriented to person, place, and time. He has normal reflexes. No cranial nerve deficit. Coordination normal.  Skin: Skin is warm and dry. No rash noted. He is not diaphoretic. No erythema. No pallor.  Psychiatric: He has a normal mood and affect. His behavior is normal. Judgment and thought content normal.  Nursing note and vitals reviewed.  Vitals:   07/14/16 1024  BP: 138/74  Pulse: 66  SpO2: 97%  Weight: 163 lb (73.9 kg)  Height: 5\' 5"  (1.651 m)        Assessment:     Severe copd AECOPD Post nasal drip      Plan:      Take prednisone 40 mg daily x 2 days, then 20mg  daily x 2 days, then 10mg  daily x 2 days, then 5mg  daily x 2 days and stop  Take doxycycline 100mg  po twice daily x 5 days; take after meals and avoid sunlight  Start/ take generic fluticasone inhaler 2 squirts each nostril daily  Stop fish oil due to cough  Continue spiriva and symbicort as before  Flu shot completed 07/14/2016  Followup  -CXR in 6 months - Pre-bd and post-bd spiro and dlco only. No lung volume - do in 6 months - return in 6 months after cxr   Dr. Kalman ShanMurali Davyn Elsasser, M.D., [facility] Pulmonary and Critical Care Medicine Staff Physician [facility] Pager: [phone], If no answer or between  15:00h - 7:00h: call [phone]  07/14/2016 10:56 AM

## 2016-07-14 NOTE — Patient Instructions (Addendum)
  Take prednisone 40 mg daily x 2 days, then 20mg  daily x 2 days, then 10mg  daily x 2 days, then 5mg  daily x 2 days and stop  Take doxycycline 100mg  po twice daily x 5 days; take after meals and avoid sunlight  Start/ take generic fluticasone inhaler 2 squirts each nostril daily  Stop fish oil due to cough  Continue spiriva and symbicort as before  Flu shot completed 07/14/2016  Followup  -CXR in 6 months - Pre-bd and post-bd spiro and dlco only. No lung volume - do in 6 months - return in 6 months after cxr

## 2016-08-01 DIAGNOSIS — R0602 Shortness of breath: Secondary | ICD-10-CM | POA: Diagnosis not present

## 2016-08-01 DIAGNOSIS — J449 Chronic obstructive pulmonary disease, unspecified: Secondary | ICD-10-CM | POA: Diagnosis not present

## 2016-08-01 DIAGNOSIS — J438 Other emphysema: Secondary | ICD-10-CM | POA: Diagnosis not present

## 2016-08-08 DIAGNOSIS — H2513 Age-related nuclear cataract, bilateral: Secondary | ICD-10-CM | POA: Diagnosis not present

## 2016-08-24 DIAGNOSIS — F419 Anxiety disorder, unspecified: Secondary | ICD-10-CM | POA: Diagnosis not present

## 2016-08-24 DIAGNOSIS — R69 Illness, unspecified: Secondary | ICD-10-CM | POA: Diagnosis not present

## 2016-08-24 DIAGNOSIS — F319 Bipolar disorder, unspecified: Secondary | ICD-10-CM | POA: Diagnosis not present

## 2016-08-24 DIAGNOSIS — F1120 Opioid dependence, uncomplicated: Secondary | ICD-10-CM | POA: Diagnosis not present

## 2016-08-26 DIAGNOSIS — H25812 Combined forms of age-related cataract, left eye: Secondary | ICD-10-CM | POA: Diagnosis not present

## 2016-08-26 DIAGNOSIS — H25811 Combined forms of age-related cataract, right eye: Secondary | ICD-10-CM | POA: Diagnosis not present

## 2016-08-26 DIAGNOSIS — H25813 Combined forms of age-related cataract, bilateral: Secondary | ICD-10-CM | POA: Diagnosis not present

## 2016-08-31 DIAGNOSIS — H25811 Combined forms of age-related cataract, right eye: Secondary | ICD-10-CM | POA: Diagnosis not present

## 2016-08-31 DIAGNOSIS — H52201 Unspecified astigmatism, right eye: Secondary | ICD-10-CM | POA: Diagnosis not present

## 2016-08-31 DIAGNOSIS — H52221 Regular astigmatism, right eye: Secondary | ICD-10-CM | POA: Diagnosis not present

## 2016-08-31 DIAGNOSIS — H2511 Age-related nuclear cataract, right eye: Secondary | ICD-10-CM | POA: Diagnosis not present

## 2016-09-01 DIAGNOSIS — J438 Other emphysema: Secondary | ICD-10-CM | POA: Diagnosis not present

## 2016-09-01 DIAGNOSIS — R0602 Shortness of breath: Secondary | ICD-10-CM | POA: Diagnosis not present

## 2016-09-01 DIAGNOSIS — J449 Chronic obstructive pulmonary disease, unspecified: Secondary | ICD-10-CM | POA: Diagnosis not present

## 2016-09-06 DIAGNOSIS — H2512 Age-related nuclear cataract, left eye: Secondary | ICD-10-CM | POA: Diagnosis not present

## 2016-09-14 DIAGNOSIS — H25812 Combined forms of age-related cataract, left eye: Secondary | ICD-10-CM | POA: Diagnosis not present

## 2016-09-14 DIAGNOSIS — H2512 Age-related nuclear cataract, left eye: Secondary | ICD-10-CM | POA: Diagnosis not present

## 2016-09-16 ENCOUNTER — Telehealth: Payer: Self-pay | Admitting: Internal Medicine

## 2016-09-16 MED ORDER — AZITHROMYCIN 250 MG PO TABS: ORAL_TABLET | ORAL | 0 refills | Status: AC

## 2016-09-16 MED ORDER — PREDNISONE 10 MG PO TABS: ORAL_TABLET | ORAL | 0 refills | Status: DC

## 2016-09-16 NOTE — Telephone Encounter (Signed)
Spoke with pt. States that he has an upper respiratory infection. Reports wheezing, chest tightness, SOB. Denies coughing or fever. Has been taking Dayquil and Nyquil with no relief. Would like to have something called in.  MW - please advise as MR was on night shift last night. Thanks.

## 2016-09-16 NOTE — Telephone Encounter (Signed)
Spoke with pt. He is aware of MW's recommendations. Rxs have been sent in. Nothing further was needed. 

## 2016-09-16 NOTE — Telephone Encounter (Signed)
zpak If taking pred daily then double the dose until better   If not, Prednisone 10 mg take  4 each am x 2 days,   2 each am x 2 days,  1 each am x 2 days and stop

## 2016-10-01 DIAGNOSIS — R0602 Shortness of breath: Secondary | ICD-10-CM | POA: Diagnosis not present

## 2016-10-01 DIAGNOSIS — J438 Other emphysema: Secondary | ICD-10-CM | POA: Diagnosis not present

## 2016-10-01 DIAGNOSIS — J449 Chronic obstructive pulmonary disease, unspecified: Secondary | ICD-10-CM | POA: Diagnosis not present

## 2016-11-01 DIAGNOSIS — J438 Other emphysema: Secondary | ICD-10-CM | POA: Diagnosis not present

## 2016-11-01 DIAGNOSIS — R0602 Shortness of breath: Secondary | ICD-10-CM | POA: Diagnosis not present

## 2016-11-01 DIAGNOSIS — J449 Chronic obstructive pulmonary disease, unspecified: Secondary | ICD-10-CM | POA: Diagnosis not present

## 2016-11-12 ENCOUNTER — Encounter (HOSPITAL_COMMUNITY): Payer: Self-pay | Admitting: *Deleted

## 2016-11-12 ENCOUNTER — Emergency Department (HOSPITAL_COMMUNITY)
Admission: EM | Admit: 2016-11-12 | Discharge: 2016-11-12 | Disposition: A | Discharge status: Home / Self Care | Payer: Medicare HMO | Attending: Emergency Medicine | Admitting: Emergency Medicine

## 2016-11-12 DIAGNOSIS — T507X1A Poisoning by analeptics and opioid receptor antagonists, accidental (unintentional), initial encounter: Secondary | ICD-10-CM | POA: Diagnosis not present

## 2016-11-12 DIAGNOSIS — R4781 Slurred speech: Secondary | ICD-10-CM | POA: Diagnosis not present

## 2016-11-12 DIAGNOSIS — I10 Essential (primary) hypertension: Secondary | ICD-10-CM | POA: Insufficient documentation

## 2016-11-12 DIAGNOSIS — T40601A Poisoning by unspecified narcotics, accidental (unintentional), initial encounter: Secondary | ICD-10-CM

## 2016-11-12 DIAGNOSIS — J449 Chronic obstructive pulmonary disease, unspecified: Secondary | ICD-10-CM | POA: Diagnosis not present

## 2016-11-12 DIAGNOSIS — Z7982 Long term (current) use of aspirin: Secondary | ICD-10-CM | POA: Insufficient documentation

## 2016-11-12 DIAGNOSIS — I6789 Other cerebrovascular disease: Secondary | ICD-10-CM | POA: Diagnosis not present

## 2016-11-12 DIAGNOSIS — Z79899 Other long term (current) drug therapy: Secondary | ICD-10-CM | POA: Diagnosis not present

## 2016-11-12 DIAGNOSIS — F17210 Nicotine dependence, cigarettes, uncomplicated: Secondary | ICD-10-CM | POA: Insufficient documentation

## 2016-11-12 DIAGNOSIS — R402411 Glasgow coma scale score 13-15, in the field [EMT or ambulance]: Secondary | ICD-10-CM | POA: Diagnosis not present

## 2016-11-12 DIAGNOSIS — T402X1A Poisoning by other opioids, accidental (unintentional), initial encounter: Secondary | ICD-10-CM | POA: Diagnosis not present

## 2016-11-12 NOTE — Discharge Instructions (Signed)
You are leaving against medical advise.  We discussed risk of death and severe disability in setting of recurrent overdose when narcan wears off  Please return without fail for worsening symptoms, including confusion, difficulty breathing,chest pain, or any other symptoms concerning to you.

## 2016-11-12 NOTE — ED Triage Notes (Signed)
Pt arrives via [facility] after family called rt drowsiness and altered mental status. Pt admits to using 1 10mg  percocet, and is a&o x4 after receiving narcan PTA. Pt states he doesn't want any care and wishes. Pt is asking to leave, Dr. Verdie MosherLiu informed and is currently seeing pt.

## 2016-11-12 NOTE — ED Provider Notes (Signed)
[facility] DEPT Provider Note   [name]: [id number] Arrival date & time: 11/12/16  1350     History   [id number] Complaint Chief Complaint  Patient presents with  . Drug Overdose    HPI Glen Lopez is a 61 y.o. male.  HPI  61 year old male who presents after an intentional overdose. Family called EMS today when they noted the patient was very drowsy with confusion. He admits to taking 10 mg Percocet today, and denies any other sedating medications. He did receive Narcan prior to arrival, and now mentating normally. Denies any alcohol, illicit drug use including IVDU or other medications. History of COPD on 3 L. Now requesting discharge home w/o work-up.  Past Medical History:  Diagnosis Date  . Anxiety   . Arthritis   . Asthma   . Bipolar disorder (HCC)   . COPD (chronic obstructive pulmonary disease) (HCC)   . Depression   . GERD (gastroesophageal reflux disease)   . Headache(784.0)   . Hiatal hernia   . Hypertension     Patient Active Problem List   Diagnosis Date Noted  . COPD, severe (HCC) 07/14/2016  . Post-nasal drip 07/14/2016  . Acute encephalopathy 12/08/2015  . Elevated troponin 12/08/2015  . COPD exacerbation (HCC) 09/10/2015  . Chronic respiratory failure with hypoxia (HCC) 09/10/2015  . Oral thrush 09/10/2015  . Throat pain 12/23/2014  . COPD (chronic obstructive pulmonary disease) with emphysema (HCC) 10/31/2013  . Essential hypertension, benign 10/31/2013  . Bipolar disorder, unspecified (HCC) 10/31/2013    Past Surgical History:  Procedure Laterality Date  . artheroscopic knee surgery R Right 01/2013       Home Medications    Prior to Admission medications   Medication Sig Start Date End Date Taking? Authorizing Provider  albuterol (PROVENTIL HFA;VENTOLIN HFA) 108 (90 BASE) MCG/ACT inhaler Inhale 2 puffs into the lungs every 4 (four) hours as needed. For shortness of breath    Historical Provider, MD  albuterol (PROVENTIL) (2.5 MG/3ML)  0.083% nebulizer solution Take 2.5 mg by nebulization every 6 (six) hours as needed. For shortness of breath    Historical Provider, MD  ALPRAZolam Prudy Feeler(XANAX) 1 MG tablet Take 1 mg by mouth 3 (three) times daily as needed for anxiety.     Historical Provider, MD  aspirin EC 81 MG tablet Take 1 tablet (81 mg total) by mouth daily. 12/09/15   Renae FickleMackenzie Short, MD  atenolol-chlorthalidone (TENORETIC) 100-25 MG tablet Take 1 tablet by mouth daily. 04/28/16   Historical Provider, MD  budesonide-formoterol (SYMBICORT) 160-4.5 MCG/ACT inhaler Inhale 2 puffs into the lungs 2 (two) times daily.    Historical Provider, MD  doxycycline (VIBRA-TABS) 100 MG tablet Take 1 tablet, twice daily for 5 days. Avoid sunlight and take after meals. 07/14/16   Kalman ShanMurali Ramaswamy, MD  fluticasone (FLONASE) 50 MCG/ACT nasal spray Place 2 sprays into both nostrils daily. 07/14/16   Kalman ShanMurali Ramaswamy, MD  lithium carbonate 300 MG capsule Take 300 mg by mouth 3 (three) times daily with meals.    Historical Provider, MD  Multiple Vitamin (MULTIVITAMIN WITH MINERALS) TABS tablet Take 1 tablet by mouth daily.    Historical Provider, MD  Omega-3 Fatty Acids (FISH OIL) 500 MG CAPS Take 500 mg by mouth daily.    Historical Provider, MD  omeprazole (PRILOSEC) 20 MG capsule Take 20 mg by mouth daily as needed (for acid reflux/indigestion).    Historical Provider, MD  predniSONE (DELTASONE) 10 MG tablet Take 4 tabs for 2 days, 2 tabs for  2 days, 1 tab for 2 days 09/16/16   Nyoka Cowden, MD  Tiotropium Bromide Monohydrate (SPIRIVA RESPIMAT) 2.5 MCG/ACT AERS 2 puffs each morning 12/02/15   Kalman Shan, MD    Family History Family History  Problem Relation Age of Onset  . Heart disease Father     MI at age 77  . Asthma Sister     Social History Social History  Substance Use Topics  . Smoking status: Current Every Day Smoker    Packs/day: 1.00    Years: 48.00    Types: Cigarettes  . Smokeless tobacco: Never Used     Comment: 2 cigs  daily//09/10/15  . Alcohol use No     Allergies   Codeine   Review of Systems Review of Systems 10/14 systems reviewed and are negative other than those stated in the HPI   Physical Exam Updated Vital Signs BP 128/76 (BP Location: Right Arm)   Pulse 108   Temp 98 F (36.7 C) (Oral)   Resp 18   Ht 5\' 5"  (1.651 m)   Wt 166 lb (75.3 kg)   SpO2 95%   BMI 27.62 kg/m   Physical Exam Physical Exam  Nursing note and vitals reviewed. Constitutional: Well developed, well nourished, non-toxic, and in no acute distress Head: Normocephalic and atraumatic.  Mouth/Throat: Oropharynx is clear and moist.  Neck: Normal range of motion. Neck supple.  Cardiovascular: Tachycardic rate and regular rhythm.   Pulmonary/Chest: Effort normal and breath sounds normal.  Abdominal: Soft. There is no tenderness. There is no rebound and no guarding.  Musculoskeletal: Normal range of motion.  Neurological: Alert, no facial droop, fluent speech, moves all extremities symmetrically Skin: Skin is warm and dry.  Psychiatric: Cooperative   ED Treatments / Results  Labs (all labs ordered are listed, but only abnormal results are displayed) Labs Reviewed - No data to display  EKG  EKG Interpretation None       Radiology No results found.  Procedures Procedures (including critical care time)  Medications Ordered in ED Medications - No data to display   Initial Impression / Assessment and Plan / ED Course  I have reviewed the triage vital signs and the nursing notes.  Pertinent labs & imaging results that were available during my care of the patient were reviewed by me and considered in my medical decision making (see chart for details).  Clinical Course    Presenting after unintentional opiate overdose. Mentating normally on my evaluation after he has received Narcan. He expresses that he would like to be discharged without any further workup. I discussed risks of leaving due to  shorter acting nature of Narcan, including risks of death and severe disability, from AMS, respiratory/ventilatory failure, etc. at this time, he is mentating normally, appears to have capacity to make his own medical decisions, and is able to express back to me the risks of leaving here AGAINST MEDICAL ADVICE. Discussed strict return and follow-up instructions.  Final Clinical Impressions(s) / ED Diagnoses   Final diagnoses:  Opiate overdose, accidental or unintentional, initial encounter    New Prescriptions New Prescriptions   No medications on file     Lavera Guise, MD 11/12/16 1414

## 2016-11-12 NOTE — ED Notes (Signed)
Pt leaves AMA within minutes after being brought into ED via [facility]. Pt did sign AMA paperwork prior to leaving.

## 2016-11-24 DIAGNOSIS — M5136 Other intervertebral disc degeneration, lumbar region: Secondary | ICD-10-CM | POA: Diagnosis not present

## 2016-11-24 DIAGNOSIS — S134XXA Sprain of ligaments of cervical spine, initial encounter: Secondary | ICD-10-CM | POA: Diagnosis not present

## 2016-12-02 DIAGNOSIS — J438 Other emphysema: Secondary | ICD-10-CM | POA: Diagnosis not present

## 2016-12-02 DIAGNOSIS — J449 Chronic obstructive pulmonary disease, unspecified: Secondary | ICD-10-CM | POA: Diagnosis not present

## 2016-12-02 DIAGNOSIS — R0602 Shortness of breath: Secondary | ICD-10-CM | POA: Diagnosis not present

## 2016-12-06 ENCOUNTER — Other Ambulatory Visit: Payer: Self-pay | Admitting: Internal Medicine

## 2016-12-30 DIAGNOSIS — R0602 Shortness of breath: Secondary | ICD-10-CM | POA: Diagnosis not present

## 2016-12-30 DIAGNOSIS — J449 Chronic obstructive pulmonary disease, unspecified: Secondary | ICD-10-CM | POA: Diagnosis not present

## 2016-12-30 DIAGNOSIS — J438 Other emphysema: Secondary | ICD-10-CM | POA: Diagnosis not present

## 2017-01-30 DIAGNOSIS — J438 Other emphysema: Secondary | ICD-10-CM | POA: Diagnosis not present

## 2017-01-30 DIAGNOSIS — J449 Chronic obstructive pulmonary disease, unspecified: Secondary | ICD-10-CM | POA: Diagnosis not present

## 2017-01-30 DIAGNOSIS — R0602 Shortness of breath: Secondary | ICD-10-CM | POA: Diagnosis not present

## 2017-02-08 DIAGNOSIS — F419 Anxiety disorder, unspecified: Secondary | ICD-10-CM | POA: Diagnosis not present

## 2017-02-08 DIAGNOSIS — F319 Bipolar disorder, unspecified: Secondary | ICD-10-CM | POA: Diagnosis not present

## 2017-02-14 DIAGNOSIS — F1120 Opioid dependence, uncomplicated: Secondary | ICD-10-CM | POA: Diagnosis not present

## 2017-02-16 DIAGNOSIS — F1120 Opioid dependence, uncomplicated: Secondary | ICD-10-CM | POA: Diagnosis not present

## 2017-02-16 DIAGNOSIS — F319 Bipolar disorder, unspecified: Secondary | ICD-10-CM | POA: Diagnosis not present

## 2017-02-16 DIAGNOSIS — F419 Anxiety disorder, unspecified: Secondary | ICD-10-CM | POA: Diagnosis not present

## 2017-02-23 DIAGNOSIS — F1120 Opioid dependence, uncomplicated: Secondary | ICD-10-CM | POA: Diagnosis not present

## 2017-02-23 DIAGNOSIS — F319 Bipolar disorder, unspecified: Secondary | ICD-10-CM | POA: Diagnosis not present

## 2017-02-23 DIAGNOSIS — F419 Anxiety disorder, unspecified: Secondary | ICD-10-CM | POA: Diagnosis not present

## 2017-03-01 DIAGNOSIS — J449 Chronic obstructive pulmonary disease, unspecified: Secondary | ICD-10-CM | POA: Diagnosis not present

## 2017-03-01 DIAGNOSIS — R0602 Shortness of breath: Secondary | ICD-10-CM | POA: Diagnosis not present

## 2017-03-01 DIAGNOSIS — J438 Other emphysema: Secondary | ICD-10-CM | POA: Diagnosis not present

## 2017-03-16 DIAGNOSIS — F419 Anxiety disorder, unspecified: Secondary | ICD-10-CM | POA: Diagnosis not present

## 2017-03-16 DIAGNOSIS — F319 Bipolar disorder, unspecified: Secondary | ICD-10-CM | POA: Diagnosis not present

## 2017-03-16 DIAGNOSIS — F1120 Opioid dependence, uncomplicated: Secondary | ICD-10-CM | POA: Diagnosis not present

## 2017-03-29 DIAGNOSIS — F1120 Opioid dependence, uncomplicated: Secondary | ICD-10-CM | POA: Diagnosis not present

## 2017-03-31 ENCOUNTER — Other Ambulatory Visit: Payer: Self-pay | Admitting: Internal Medicine

## 2017-04-25 DIAGNOSIS — F17210 Nicotine dependence, cigarettes, uncomplicated: Secondary | ICD-10-CM | POA: Diagnosis not present

## 2017-04-25 DIAGNOSIS — J449 Chronic obstructive pulmonary disease, unspecified: Secondary | ICD-10-CM | POA: Diagnosis not present

## 2017-04-25 DIAGNOSIS — F419 Anxiety disorder, unspecified: Secondary | ICD-10-CM | POA: Diagnosis not present

## 2017-04-25 DIAGNOSIS — S30861A Insect bite (nonvenomous) of abdominal wall, initial encounter: Secondary | ICD-10-CM | POA: Diagnosis not present

## 2017-04-25 DIAGNOSIS — F1120 Opioid dependence, uncomplicated: Secondary | ICD-10-CM | POA: Diagnosis not present

## 2017-04-25 DIAGNOSIS — M545 Low back pain: Secondary | ICD-10-CM | POA: Diagnosis not present

## 2017-04-25 DIAGNOSIS — Z791 Long term (current) use of non-steroidal anti-inflammatories (NSAID): Secondary | ICD-10-CM | POA: Diagnosis not present

## 2017-04-25 DIAGNOSIS — F319 Bipolar disorder, unspecified: Secondary | ICD-10-CM | POA: Diagnosis not present

## 2017-05-01 DIAGNOSIS — F1120 Opioid dependence, uncomplicated: Secondary | ICD-10-CM | POA: Diagnosis not present

## 2017-05-10 DIAGNOSIS — F1120 Opioid dependence, uncomplicated: Secondary | ICD-10-CM | POA: Diagnosis not present

## 2017-05-24 DIAGNOSIS — F1120 Opioid dependence, uncomplicated: Secondary | ICD-10-CM | POA: Diagnosis not present

## 2017-06-07 DIAGNOSIS — F1120 Opioid dependence, uncomplicated: Secondary | ICD-10-CM | POA: Diagnosis not present

## 2017-06-07 DIAGNOSIS — F419 Anxiety disorder, unspecified: Secondary | ICD-10-CM | POA: Diagnosis not present

## 2017-06-19 DIAGNOSIS — F419 Anxiety disorder, unspecified: Secondary | ICD-10-CM | POA: Diagnosis not present

## 2017-06-19 DIAGNOSIS — F1120 Opioid dependence, uncomplicated: Secondary | ICD-10-CM | POA: Diagnosis not present

## 2017-06-19 DIAGNOSIS — F4321 Adjustment disorder with depressed mood: Secondary | ICD-10-CM | POA: Diagnosis not present

## 2017-06-26 ENCOUNTER — Encounter (HOSPITAL_COMMUNITY): Payer: Self-pay | Admitting: Emergency Medicine

## 2017-06-26 ENCOUNTER — Emergency Department (HOSPITAL_COMMUNITY)
Admission: EM | Admit: 2017-06-26 | Discharge: 2017-06-26 | Disposition: A | Discharge status: Home / Self Care | Payer: No Typology Code available for payment source | Attending: Emergency Medicine | Admitting: Emergency Medicine

## 2017-06-26 ENCOUNTER — Emergency Department (HOSPITAL_COMMUNITY): Payer: No Typology Code available for payment source

## 2017-06-26 DIAGNOSIS — Z7982 Long term (current) use of aspirin: Secondary | ICD-10-CM | POA: Insufficient documentation

## 2017-06-26 DIAGNOSIS — J449 Chronic obstructive pulmonary disease, unspecified: Secondary | ICD-10-CM | POA: Diagnosis not present

## 2017-06-26 DIAGNOSIS — J45909 Unspecified asthma, uncomplicated: Secondary | ICD-10-CM | POA: Insufficient documentation

## 2017-06-26 DIAGNOSIS — Z79899 Other long term (current) drug therapy: Secondary | ICD-10-CM | POA: Insufficient documentation

## 2017-06-26 DIAGNOSIS — I10 Essential (primary) hypertension: Secondary | ICD-10-CM | POA: Diagnosis not present

## 2017-06-26 DIAGNOSIS — M25512 Pain in left shoulder: Secondary | ICD-10-CM | POA: Insufficient documentation

## 2017-06-26 DIAGNOSIS — F17210 Nicotine dependence, cigarettes, uncomplicated: Secondary | ICD-10-CM | POA: Insufficient documentation

## 2017-06-26 DIAGNOSIS — Z041 Encounter for examination and observation following transport accident: Secondary | ICD-10-CM | POA: Diagnosis not present

## 2017-06-26 DIAGNOSIS — M25551 Pain in right hip: Secondary | ICD-10-CM

## 2017-06-26 DIAGNOSIS — S79911A Unspecified injury of right hip, initial encounter: Secondary | ICD-10-CM | POA: Diagnosis not present

## 2017-06-26 MED ORDER — KETOROLAC TROMETHAMINE 30 MG/ML IJ SOLN
30.0000 mg | Freq: Once | INTRAMUSCULAR | Status: AC
  Administered 2017-06-26: 30 mg via INTRAMUSCULAR
  Filled 2017-06-26: qty 1

## 2017-06-26 MED ORDER — DICLOFENAC SODIUM 1 % TD GEL: 2.0000 g | Freq: Four times a day (QID) | TRANSDERMAL | 0 refills | Status: DC
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
## 2017-06-26 NOTE — ED Notes (Signed)
PT states understanding of care given, follow up care, and medication prescribed. PT ambulated from ED to car with a steady gait. 
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
## 2017-06-26 NOTE — ED Notes (Signed)
See EDP secondary assessment.  

## 2017-06-26 NOTE — ED Notes (Signed)
PA with patient at this time.

## 2017-11-23 ENCOUNTER — Encounter: Payer: Self-pay | Admitting: Internal Medicine

## 2017-11-23 ENCOUNTER — Ambulatory Visit (INDEPENDENT_AMBULATORY_CARE_PROVIDER_SITE_OTHER): Payer: Medicare HMO | Admitting: Internal Medicine

## 2017-11-23 VITALS — BP 126/94 | HR 104 | Ht 66.5 in | Wt 171.8 lb

## 2017-11-23 DIAGNOSIS — F17200 Nicotine dependence, unspecified, uncomplicated: Secondary | ICD-10-CM | POA: Diagnosis not present

## 2017-11-23 DIAGNOSIS — J441 Chronic obstructive pulmonary disease with (acute) exacerbation: Secondary | ICD-10-CM | POA: Diagnosis not present

## 2017-11-23 DIAGNOSIS — Z129 Encounter for screening for malignant neoplasm, site unspecified: Secondary | ICD-10-CM | POA: Diagnosis not present

## 2017-11-23 MED ORDER — DOXYCYCLINE HYCLATE 100 MG PO TABS: 100.0000 mg | ORAL_TABLET | Freq: Two times a day (BID) | ORAL | 0 refills | Status: DC

## 2017-11-23 MED ORDER — PREDNISONE 10 MG PO TABS: ORAL_TABLET | ORAL | 0 refills | Status: DC

## 2017-11-23 NOTE — Patient Instructions (Addendum)
COPD exacerbation (HCC)  Take doxycycline 100mg  po twice daily x 5 days; take after meals and avoid sunlight Take prednisone 40 mg daily x 2 days, then 20mg  daily x 2 days, then 10mg  daily x 2 days, then 5mg  daily x 2 days and stop Continue spirivan and symbicort as before with albuterol as needed   Smoker - please quit  Cancer screening - refer Evette DoffingSarah G for lung cancer screening program   Followup 6 mnths or sooner

## 2017-11-23 NOTE — Addendum Note (Signed)
Addended by: Wyvonne LenzPINION, Yunique Dearcos P on: 11/23/2017 12:11 PM   Modules accepted: Orders

## 2017-11-23 NOTE — Progress Notes (Signed)
Subjective:     Patient ID: Glen Lopez, male   DOB: 1956-06-23, 62 y.o.   MRN: [id number]  HPI   OV 07/14/2016  Chief Complaint  Patient presents with  . Follow-up    9 month COPD follow up - reports breathing is near baseline with the change of seasons.    Fu copd - mod/severe with CT features of MAI early 2017 Fu smoking  Follows with ? Wife. Wants flu shot. Wants med refills. Cotninues to smoke; unable to quit. In feb 2017 says admitted for ae-copd and this scared him. Says now for months increased wheeze and yellow sputum and also cough with sinus drainage. He is off opioids. Does not want to go back to opioids due to addiction but cough is worse.    OV 11/23/2017  Chief Complaint  Patient presents with  . Follow-up    Last seen 07/14/16. Pt states he has had a lot of congestion in head and chest, coughing with green mucus, and a lot of chest discomfort that has been happenng x5 days.  62 year old male with COPD not otherwise specified.  Features of MAI in 2017.  Active smoker  This is a routine visit.  Is on Spiriva and Symbicort which he says he is compliant with. He continues to smoke.  Has difficulty quitting.  He tells me that for the last 5 or 6 days he has had increased cough congestion wheezing yellow green sputum and severe shortness of breath.  There is no fever or chills.  No flu exposure.  COPD CAT score is documented below and significantly high.    CAT COPD Symptom & Quality of Life Score (GSK trademark) 0 is no burden. 5 is highest burden 11/23/2017   Never Cough -> Cough all the time 5  No phlegm in chest -> Chest is full of phlegm 5  No chest tightness -> Chest feels very tight 5  No dyspnea for 1 flight stairs/hill -> Very dyspneic for 1 flight of stairs 5  No limitations for ADL at home -> Very limited with ADL at home 2  Confident leaving home -> Not at all confident leaving home 5  Sleep soundly -> Do not sleep soundly because of lung condition 2   Lots of Energy -> No energy at all 5  TOTAL Score (max 40)  34       has a past medical history of Anxiety, Arthritis, Asthma, Bipolar disorder (HCC), COPD (chronic obstructive pulmonary disease) (HCC), Depression, GERD (gastroesophageal reflux disease), Headache(784.0), Hiatal hernia, and Hypertension.   reports that he has been smoking cigarettes.  He has a 48.00 pack-year smoking history. he has never used smokeless tobacco.  Past Surgical History:  Procedure Laterality Date  . artheroscopic knee surgery R Right 01/2013    Allergies  Allergen Reactions  . Codeine Nausea And Vomiting    Immunization History  Administered Date(s) Administered  . Influenza Split 09/23/2017  . Influenza,inj,Quad PF,6+ Mos 09/09/2015, 07/14/2016  . Influenza-Unspecified 07/31/2014  . Pneumococcal Polysaccharide-23 10/31/2013    Family History  Problem Relation Age of Onset  . Heart disease Father        MI at age 55  . Asthma Sister      Current Outpatient Medications:  .  albuterol (PROVENTIL HFA;VENTOLIN HFA) 108 (90 BASE) MCG/ACT inhaler, Inhale 2 puffs into the lungs every 4 (four) hours as needed. For shortness of breath, Disp: , Rfl:  .  albuterol (PROVENTIL) (2.5 MG/3ML) 0.083% nebulizer solution,  Take 2.5 mg by nebulization every 6 (six) hours as needed. For shortness of breath, Disp: , Rfl:  .  aspirin EC 81 MG tablet, Take 1 tablet (81 mg total) by mouth daily., Disp: , Rfl:  .  atenolol-chlorthalidone (TENORETIC) 100-25 MG tablet, Take 1 tablet by mouth daily., Disp: , Rfl:  .  budesonide-formoterol (SYMBICORT) 160-4.5 MCG/ACT inhaler, Inhale 2 puffs into the lungs 2 (two) times daily., Disp: , Rfl:  .  fluticasone (FLONASE) 50 MCG/ACT nasal spray, Place 2 sprays into both nostrils daily., Disp: 16 g, Rfl: 5 .  lithium carbonate 300 MG capsule, Take 300 mg by mouth 3 (three) times daily with meals., Disp: , Rfl:  .  Multiple Vitamin (MULTIVITAMIN WITH MINERALS) TABS tablet, Take  1 tablet by mouth daily., Disp: , Rfl:  .  Omega-3 Fatty Acids (FISH OIL) 500 MG CAPS, Take 500 mg by mouth daily., Disp: , Rfl:  .  omeprazole (PRILOSEC) 20 MG capsule, Take 20 mg by mouth daily as needed (for acid reflux/indigestion)., Disp: , Rfl:  .  SPIRIVA RESPIMAT 2.5 MCG/ACT AERS, INHALE 2 PUFFS INTO THE LUNGS EACH MORNING, Disp: 4 g, Rfl: 6    Review of Systems     Objective:   Physical Exam  Constitutional: He is oriented to person, place, and time. He appears well-developed and well-nourished. No distress.  HENT:  Head: Normocephalic and atraumatic.  Right Ear: External ear normal.  Left Ear: External ear normal.  Mouth/Throat: Oropharynx is clear and moist. No oropharyngeal exudate.  cig smell +  Eyes: Conjunctivae and EOM are normal. Pupils are equal, round, and reactive to light. Right eye exhibits no discharge. Left eye exhibits no discharge. No scleral icterus.  Neck: Normal range of motion. Neck supple. No JVD present. No tracheal deviation present. No thyromegaly present.  Cardiovascular: Normal rate, regular rhythm and intact distal pulses. Exam reveals no gallop and no friction rub.  No murmur heard. Pulmonary/Chest: Effort normal. No respiratory distress. He has wheezes. He has no rales. He exhibits no tenderness.  Abdominal: Soft. Bowel sounds are normal. He exhibits no distension and no mass. There is no tenderness. There is no rebound and no guarding.  vsiceeral obesity  Musculoskeletal: Normal range of motion. He exhibits no edema or tenderness.  Lymphadenopathy:    He has no cervical adenopathy.  Neurological: He is alert and oriented to person, place, and time. He has normal reflexes. No cranial nerve deficit. Coordination normal.  Skin: Skin is warm and dry. No rash noted. He is not diaphoretic. No erythema. No pallor.  Psychiatric: He has a normal mood and affect. His behavior is normal. Judgment and thought content normal.  Nursing note and vitals  reviewed.  Vitals:   11/23/17 1142  BP: (!) 126/94  Pulse: (!) 104  SpO2: 93%  Weight: 171 lb 12.8 oz (77.9 kg)  Height: 5' 6.5" (1.689 m)    Estimated body mass index is 27.31 kg/m as calculated from the following:   Height as of this encounter: 5' 6.5" (1.689 m).   Weight as of this encounter: 171 lb 12.8 oz (77.9 kg).     Assessment:       ICD-10-CM   1. COPD exacerbation (HCC) J44.1   2. Smoker F17.200   3. Cancer screening Z12.9    Based on symptoms his COPD exacerbation and physical exam he has COPD exacerbation.    Plan:     COPD exacerbation (HCC)  Take doxycycline 100mg  po twice daily  x 5 days; take after meals and avoid sunlight Take prednisone 40 mg daily x 2 days, then 20mg  daily x 2 days, then 10mg  daily x 2 days, then 5mg  daily x 2 days and stop Continue spirivan and symbicort as before with albuterol as needed   Smoker - please quit  Cancer screening - refer Evette DoffingSarah G for lung cancer screening program   Followup 6 mnths or sooner   Dr. Kalman ShanMurali Keanan Melander, M.D., [facility] Pulmonary and Critical Care Medicine Staff Physician, [facility] Director - Interstitial Lung Disease  Program  Pulmonary Fibrosis [facility] [place], [place], [id number]  Pager: [phone], If no answer or between  15:00h - 7:00h: call [phone] Telephone: [phone]

## 2017-11-28 ENCOUNTER — Telehealth: Payer: Self-pay | Admitting: Internal Medicine

## 2017-11-28 MED ORDER — BUDESONIDE-FORMOTEROL FUMARATE 160-4.5 MCG/ACT IN AERO: 2.0000 | INHALATION_SPRAY | Freq: Two times a day (BID) | RESPIRATORY_TRACT | 6 refills | Status: DC

## 2017-11-28 MED ORDER — TIOTROPIUM BROMIDE MONOHYDRATE 2.5 MCG/ACT IN AERS: INHALATION_SPRAY | RESPIRATORY_TRACT | 6 refills | Status: DC

## 2017-11-28 MED ORDER — ALBUTEROL SULFATE HFA 108 (90 BASE) MCG/ACT IN AERS: 2.0000 | INHALATION_SPRAY | RESPIRATORY_TRACT | 5 refills | Status: DC | PRN

## 2017-11-28 NOTE — Telephone Encounter (Signed)
Called and spoke with pt who stated he was needing a refill of symbicort, spiriva, and albuterol hfa sent to his preferred pharmacy.  All meds refilled for pt. Nothing further needed at this current time.

## 2017-11-29 ENCOUNTER — Telehealth: Payer: Self-pay | Admitting: Acute Care

## 2017-11-29 DIAGNOSIS — F17210 Nicotine dependence, cigarettes, uncomplicated: Secondary | ICD-10-CM

## 2017-11-29 DIAGNOSIS — Z122 Encounter for screening for malignant neoplasm of respiratory organs: Secondary | ICD-10-CM

## 2017-11-30 NOTE — Telephone Encounter (Signed)
Attempted to contact pt but no answer. Left message for pt stating that Angelique BlonderDenise will contact pt as soon as she is able to get him scheduled for an appt with SG for lung cancer screening.  Routing this to [place]ise to follow up on.

## 2017-12-01 NOTE — Telephone Encounter (Signed)
Spoke with pt and scheduled SDMV 12/13/17 9:30 CT ordered Nothing further needed

## 2017-12-13 ENCOUNTER — Encounter: Payer: Self-pay | Admitting: Acute Care

## 2017-12-13 ENCOUNTER — Ambulatory Visit (INDEPENDENT_AMBULATORY_CARE_PROVIDER_SITE_OTHER): Payer: Medicare HMO | Admitting: Acute Care

## 2017-12-13 ENCOUNTER — Telehealth: Payer: Self-pay | Admitting: Internal Medicine

## 2017-12-13 ENCOUNTER — Ambulatory Visit: Admission: RE | Admit: 2017-12-13 | Payer: Medicare HMO | Source: Ambulatory Visit

## 2017-12-13 DIAGNOSIS — F17210 Nicotine dependence, cigarettes, uncomplicated: Secondary | ICD-10-CM | POA: Diagnosis not present

## 2017-12-13 MED ORDER — PREDNISONE 10 MG PO TABS: ORAL_TABLET | ORAL | 0 refills | Status: DC

## 2017-12-13 MED ORDER — DOXYCYCLINE HYCLATE 100 MG PO TABS: 100.0000 mg | ORAL_TABLET | Freq: Two times a day (BID) | ORAL | 0 refills | Status: DC

## 2017-12-13 NOTE — Progress Notes (Signed)
Shared Decision Making Visit Lung Cancer Screening Program [phone])   Eligibility:  Age 62 y.o.  Pack Years Smoking History Calculation 51 pack year smoking history (# packs/per year x # years smoked)  Recent History of coughing up blood  no  Unexplained weight loss? no ( >Than 15 pounds within the last 6 months )  Prior History Lung / other cancer no (Diagnosis within the last 5 years already requiring surveillance chest CT Scans).  Smoking Status Current Smoker  Former Smokers: Years since quit:NA Current smoker  Quit Date: NA, current smoker  Visit Components:  Discussion included one or more decision making aids.yes  Discussion included risk/benefits of screening. yes  Discussion included potential follow up diagnostic testing for abnormal scans. yes  Discussion included meaning and risk of over diagnosis. yes  Discussion included meaning and risk of False Positives. yes  Discussion included meaning of total radiation exposure. yes  Counseling Included:  Importance of adherence to annual lung cancer LDCT screening. yes  Impact of comorbidities on ability to participate in the program. yes  Ability and willingness to under diagnostic treatment. yes  Smoking Cessation Counseling:  Current Smokers:   Discussed importance of smoking cessation. yes  Information about tobacco cessation classes and interventions provided to patient. yes  Patient provided with "ticket" for LDCT Scan. yes Symptomatic Patient.  Counseling NA  Diagnosis Code: Tobacco Use Z72.0  Asymptomatic Patient yes  Counseling (Intermediate counseling: > three minutes counseling) U0454  Former Smokers:   Discussed the importance of maintaining cigarette abstinence. yes  Diagnosis Code: Personal History of Nicotine Dependence. U98.119  Information about tobacco cessation classes and interventions provided to patient. Yes  Patient provided with "ticket" for LDCT Scan. yes  Written  Order for Lung Cancer Screening with LDCT placed in Epic. Yes (CT Chest Lung Cancer Screening Low Dose W/O CM) [id number] Z12.2-Screening of respiratory organs Z87.891-Personal history of nicotine dependence   I have spent 25 minutes of face to face time with Mr. Cuff discussing the risks and benefits of lung cancer screening. We viewed a power point together that explained in detail the above noted topics. We paused at intervals to allow for questions to be asked and answered to ensure understanding.We discussed that the single most powerful action that he can take to decrease his risk of developing lung cancer is to quit smoking. We discussed whether or not he is ready to commit to setting a quit date. He is not ready to set a quit date. We discussed options for tools to aid in quitting smoking including nicotine replacement therapy, non-nicotine medications, support groups, Quit Smart classes, and behavior modification. We discussed that often times setting smaller, more achievable goals, such as eliminating 1 cigarette a day for a week and then 2 cigarettes a day for a week can be helpful in slowly decreasing the number of cigarettes smoked. This allows for a sense of accomplishment as well as providing a clinical benefit. I gave him the " Be Stronger Than Your Excuses" card with contact information for community resources, classes, free nicotine replacement therapy, and access to mobile apps, text messaging, and on-line smoking cessation help. I have also given him my card and contact information in the event he needs to contact me. We discussed the time and location of the scan, and that either Abigail Miyamoto RN or I will call with the results within 24-48 hours of receiving them. I have offered him  a copy of the power point we viewed  as a resource in the event they need reinforcement of the concepts we discussed today in the office. The patient verbalized understanding of all of  the above and had no  further questions upon leaving the office. They have my contact information in the event they have any further questions.  I spent 4 minutes counseling on smoking cessation and the health risks of continued tobacco abuse.  I explained to the patient that there has been a high incidence of coronary artery disease noted on these exams. I explained that this is a non-gated exam therefore degree or severity cannot be determined. This patient is not on statin therapy. I have asked the patient to follow-up with their PCP regarding any incidental finding of coronary artery disease and management with diet or medication as their PCP  feels is clinically indicated. The patient verbalized understanding of the above and had no further questions upon completion of the visit.     Bevelyn NgoSarah F Groce, NP 12/13/2017

## 2017-12-13 NOTE — Telephone Encounter (Signed)
Take doxycycline 100mg po twice daily x 5 days; take after meals and avoid sunlight  Take prednisone 40 mg daily x 2 days, then 20mg daily x 2 days, then 10mg daily x 2 days, then 5mg daily x 2 days and stop  

## 2017-12-13 NOTE — Telephone Encounter (Signed)
Medication sent to pharmacy nothing further needed.

## 2017-12-13 NOTE — Telephone Encounter (Signed)
Pt was seen in the office today by SG for a shared decision visit.  While pt was here for the visit, pt had complaints of upper chest congestion and a cough with yellow to green mucus.  At pt's previous visit with MR 11/23/17, pt was prescribed a zpak and prednisone taper due to having similar symptoms.  Pt states his symptoms are a little better after those medcs but he is still having the above symptoms stated.  Pt denies any fever.  He is still currently smoking 1ppd.  MR, please advise on recommendations for pt.  Thanks!

## 2017-12-28 ENCOUNTER — Ambulatory Visit (INDEPENDENT_AMBULATORY_CARE_PROVIDER_SITE_OTHER)
Admission: RE | Admit: 2017-12-28 | Discharge: 2017-12-28 | Disposition: A | Discharge status: Home / Self Care | Payer: Medicare HMO | Source: Ambulatory Visit | Attending: Acute Care | Admitting: Acute Care

## 2017-12-28 DIAGNOSIS — F17210 Nicotine dependence, cigarettes, uncomplicated: Secondary | ICD-10-CM | POA: Diagnosis not present

## 2017-12-28 DIAGNOSIS — Z122 Encounter for screening for malignant neoplasm of respiratory organs: Secondary | ICD-10-CM

## 2018-01-01 ENCOUNTER — Other Ambulatory Visit: Payer: Self-pay | Admitting: Acute Care

## 2018-01-01 DIAGNOSIS — F17210 Nicotine dependence, cigarettes, uncomplicated: Secondary | ICD-10-CM

## 2018-01-01 DIAGNOSIS — Z122 Encounter for screening for malignant neoplasm of respiratory organs: Secondary | ICD-10-CM

## 2018-06-25 ENCOUNTER — Encounter: Payer: Self-pay | Admitting: Primary Care

## 2018-06-25 ENCOUNTER — Telehealth: Payer: Self-pay | Admitting: Primary Care

## 2018-06-25 ENCOUNTER — Ambulatory Visit (INDEPENDENT_AMBULATORY_CARE_PROVIDER_SITE_OTHER)
Admission: RE | Admit: 2018-06-25 | Discharge: 2018-06-25 | Disposition: A | Discharge status: Home / Self Care | Payer: Medicare HMO | Source: Ambulatory Visit | Attending: Primary Care | Admitting: Primary Care

## 2018-06-25 ENCOUNTER — Ambulatory Visit (INDEPENDENT_AMBULATORY_CARE_PROVIDER_SITE_OTHER): Payer: Medicare HMO | Admitting: Primary Care

## 2018-06-25 VITALS — BP 156/100 | HR 92 | Ht 64.5 in | Wt 190.8 lb

## 2018-06-25 DIAGNOSIS — J441 Chronic obstructive pulmonary disease with (acute) exacerbation: Secondary | ICD-10-CM

## 2018-06-25 DIAGNOSIS — R05 Cough: Secondary | ICD-10-CM | POA: Diagnosis not present

## 2018-06-25 DIAGNOSIS — J189 Pneumonia, unspecified organism: Secondary | ICD-10-CM

## 2018-06-25 DIAGNOSIS — Z720 Tobacco use: Secondary | ICD-10-CM

## 2018-06-25 DIAGNOSIS — I10 Essential (primary) hypertension: Secondary | ICD-10-CM | POA: Diagnosis not present

## 2018-06-25 DIAGNOSIS — R0602 Shortness of breath: Secondary | ICD-10-CM | POA: Diagnosis not present

## 2018-06-25 MED ORDER — LEVALBUTEROL HCL 0.63 MG/3ML IN NEBU: 0.6300 mg | INHALATION_SOLUTION | Freq: Four times a day (QID) | RESPIRATORY_TRACT | Status: DC

## 2018-06-25 MED ORDER — PREDNISONE 10 MG PO TABS: ORAL_TABLET | ORAL | 0 refills | Status: DC

## 2018-06-25 MED ORDER — DOXYCYCLINE HYCLATE 100 MG PO TABS: 100.0000 mg | ORAL_TABLET | Freq: Two times a day (BID) | ORAL | 0 refills | Status: DC

## 2018-06-25 NOTE — Assessment & Plan Note (Signed)
BP elevated, patient did not take his Tenoretic today and smoked before apt - He will take medication when he gets home - Discussed getting home BP cuff to monitor readings

## 2018-06-25 NOTE — Patient Instructions (Addendum)
  Seen today for COPD exacerbation Received nebulizer treatment today in office Checking CXR Treating with doxycyline and prednisone taper Start mucinex BID for 10 days (this is over the counter) Return if no improvement     You need flu shot with PCP or come back to our office   Return in 4 months with Glen Lopez, needs full PFTs prior please   Reminder: repeat low dose CT chest due February 2020

## 2018-06-25 NOTE — Assessment & Plan Note (Addendum)
Tx doxycyline x 1 week, prednisone taper Received xopenex neb in office with improvement  Check CXR Return if symptoms do not improve or worsen

## 2018-06-25 NOTE — Assessment & Plan Note (Signed)
Smoking 3-4 cigarettes a day (from 2ppd) Encouraged patient to stop smoking!! Follows with lung cancer low dose CT scans, next due feb 2020

## 2018-06-25 NOTE — Telephone Encounter (Signed)
Patient is aware of results and verbalized understanding. Order has been placed. Nothing further needed.

## 2018-06-25 NOTE — Assessment & Plan Note (Addendum)
Continues symbicort 160 and Spiriva Check full PFTs with fu visit Dr. Marchelle Gearingamaswamy in 4 months  Smoking cessation reviewed

## 2018-06-25 NOTE — Progress Notes (Signed)
@Patient  ID: Glen Lopez, male    DOB: January 13, 1956, 62 y.o.   MRN: [id number]  Chief Complaint  Patient presents with  . Shortness of Breath    sob, hard to walk up stairs, has cough that is not always productive    Referring provider: [name], MD  HPI: 62 year old male, current smoker. Hx COPD. Patient of Dr. Marchelle Gearing, last seen in Jan 2019 for COPD exacerbation. Following with lung cancer screening clinic, seen by NP in February. CT low dose showing RADS 2. Plan repeat LDCT in 12 months (due feb 2020). Maintained on Symbicort and Spiriva.    06/25/2018 Patient presents today for acute visit with complaints of shortness of breath x1 week with rest and activity. Normally he is functional, maintained on symbicort 160 and Spiriva. Needs new neb machine, has had it for over 5 years. Reports that it quit working and there is a burning smell coming from it. Wants different DME company. BP is elevated today. He had a cigarette before today's visit and he did not take blood pressure medication. Still smoking, 3-4 cig a day (previous 2ppd).    Allergies  Allergen Reactions  . Codeine Nausea And Vomiting    Immunization History  Administered Date(s) Administered  . Influenza Split 09/23/2017  . Influenza,inj,Quad PF,6+ Mos 09/09/2015, 07/14/2016  . Influenza-Unspecified 07/31/2014  . Pneumococcal Polysaccharide-23 10/31/2013    Past Medical History:  Diagnosis Date  . Anxiety   . Arthritis   . Asthma   . Bipolar disorder (HCC)   . COPD (chronic obstructive pulmonary disease) (HCC)   . Depression   . GERD (gastroesophageal reflux disease)   . Headache(784.0)   . Hiatal hernia   . Hypertension     Tobacco History: Social History   Tobacco Use  Smoking Status Current Every Day Smoker  . Packs/day: 1.00  . Years: 48.00  . Pack years: 48.00  . Types: Cigarettes  Smokeless Tobacco Never Used  Tobacco Comment   2-3 cigs per day/  ep   Ready to quit: Not  Answered Counseling given: Not Answered Comment: 2-3 cigs per day/  ep   Outpatient Medications Prior to Visit  Medication Sig Dispense Refill  . albuterol (PROVENTIL HFA;VENTOLIN HFA) 108 (90 Base) MCG/ACT inhaler Inhale 2 puffs into the lungs every 4 (four) hours as needed. For shortness of breath 1 Inhaler 5  . albuterol (PROVENTIL) (2.5 MG/3ML) 0.083% nebulizer solution Take 2.5 mg by nebulization every 6 (six) hours as needed. For shortness of breath    . aspirin EC 81 MG tablet Take 1 tablet (81 mg total) by mouth daily.    Marland Kitchen atenolol-chlorthalidone (TENORETIC) 100-25 MG tablet Take 1 tablet by mouth daily.    . budesonide-formoterol (SYMBICORT) 160-4.5 MCG/ACT inhaler Inhale 2 puffs into the lungs 2 (two) times daily. 1 Inhaler 6  . fluticasone (FLONASE) 50 MCG/ACT nasal spray Place 2 sprays into both nostrils daily. 16 g 5  . lithium carbonate 300 MG capsule Take 300 mg by mouth 3 (three) times daily with meals.    . Multiple Vitamin (MULTIVITAMIN WITH MINERALS) TABS tablet Take 1 tablet by mouth daily.    . Omega-3 Fatty Acids (FISH OIL) 500 MG CAPS Take 500 mg by mouth daily.    Marland Kitchen omeprazole (PRILOSEC) 20 MG capsule Take 20 mg by mouth daily as needed (for acid reflux/indigestion).    . Tiotropium Bromide Monohydrate (SPIRIVA RESPIMAT) 2.5 MCG/ACT AERS INHALE 2 PUFFS INTO THE LUNGS EACH MORNING  1 Inhaler 6  . doxycycline (VIBRA-TABS) 100 MG tablet Take 1 tablet (100 mg total) by mouth 2 (two) times daily. (Patient not taking: Reported on 06/25/2018) 10 tablet 0  . doxycycline (VIBRA-TABS) 100 MG tablet Take 1 tablet (100 mg total) by mouth 2 (two) times daily. (Patient not taking: Reported on 06/25/2018) 10 tablet 0  . predniSONE (DELTASONE) 10 MG tablet Take 40x2days,20x2days,10x2days,5mg x2days,then stop (Patient not taking: Reported on 06/25/2018) 15 tablet 0  . predniSONE (DELTASONE) 10 MG tablet 40 mg daily x 2 days, then 20mg  daily x 2 days, then 10mg  daily x 2 days, then 5mg  daily x  2 days and stop (Patient not taking: Reported on 06/25/2018) 20 tablet 0   No facility-administered medications prior to visit.     Review of Systems  Review of Systems  Constitutional: Negative.   HENT: Negative.   Respiratory: Positive for cough, shortness of breath and wheezing.   Cardiovascular: Negative.   Skin: Negative.     Physical Exam  BP (!) 156/100 (BP Location: Right Arm, Patient Position: Sitting, Cuff Size: Normal)   Pulse 92   Ht 5' 4.5" (1.638 m)   Wt 190 lb 12.8 oz (86.5 kg)   SpO2 97%   BMI 32.24 kg/m  Physical Exam  Constitutional: He is oriented to person, place, and time. He appears well-developed and well-nourished.  HENT:  Head: Normocephalic and atraumatic.  Neck: Normal range of motion. Neck supple.  Cardiovascular: Normal rate, regular rhythm and normal heart sounds.  Pulmonary/Chest: Effort normal. No accessory muscle usage. No respiratory distress. He has wheezes in the right upper field, the left upper field and the left lower field. He has no rhonchi.  Musculoskeletal: Normal range of motion.       Right lower leg: Normal.       Left lower leg: Normal.  Neurological: He is alert and oriented to person, place, and time.  Skin: Skin is warm and dry.  Psychiatric: He has a normal mood and affect. His behavior is normal.     Lab Results:  CBC    Component Value Date/Time   WBC 16.3 (H) 12/09/2015 0557   RBC 4.38 12/09/2015 0557   HGB 14.0 12/09/2015 0557   HCT 43.6 12/09/2015 0557   PLT 111 (L) 12/09/2015 0557   MCV 99.5 12/09/2015 0557   MCH 32.0 12/09/2015 0557   MCHC 32.1 12/09/2015 0557   RDW 15.2 12/09/2015 0557   LYMPHSABS 0.7 12/08/2015 0551   MONOABS 0.3 12/08/2015 0551   EOSABS 0.0 12/08/2015 0551   BASOSABS 0.0 12/08/2015 0551    BMET    Component Value Date/Time   NA 139 12/09/2015 0557   K 4.7 12/09/2015 0557   CL 109 12/09/2015 0557   CO2 24 12/09/2015 0557   GLUCOSE 176 (H) 12/09/2015 0557   BUN 17 12/09/2015  0557   CREATININE 0.94 12/09/2015 0557   CALCIUM 8.4 (L) 12/09/2015 0557   GFRNONAA >60 12/09/2015 0557   GFRAA >60 12/09/2015 0557    BNP No results found for: BNP  ProBNP    Component Value Date/Time   PROBNP 125.2 (H) 10/31/2013 1645    Imaging: No results found.   Assessment & Plan:   COPD exacerbation (HCC) Tx doxycyline x 1 week, prednisone taper Received xopenex neb in office with improvement  Check CXR Return if symptoms do not improve or worsen   COPD (chronic obstructive pulmonary disease) with emphysema Continues symbicort 160 and Spiriva Check full PFTs with fu  visit Dr. Marchelle Gearing in 4 months  Smoking cessation reviewed   Tobacco use Smoking 3-4 cigarettes a day (from 2ppd) Encouraged patient to stop smoking!! Follows with lung cancer low dose CT scans, next due feb 2020   Essential hypertension, benign BP elevated, patient did not take his Tenoretic today and smoked before apt - He will take medication when he gets home - Discussed getting home BP cuff to monitor readings      [name], NP 06/25/2018

## 2018-06-26 DIAGNOSIS — J449 Chronic obstructive pulmonary disease, unspecified: Secondary | ICD-10-CM | POA: Diagnosis not present

## 2018-07-05 ENCOUNTER — Telehealth: Payer: Self-pay | Admitting: Internal Medicine

## 2018-07-05 MED ORDER — ALBUTEROL SULFATE (2.5 MG/3ML) 0.083% IN NEBU: 2.5000 mg | INHALATION_SOLUTION | Freq: Four times a day (QID) | RESPIRATORY_TRACT | 5 refills | Status: DC | PRN

## 2018-07-05 NOTE — Telephone Encounter (Signed)
Spoke with patient-he needs Albuterol neb solution refill sent to [facility] pharmacy. Pt is aware that I have sent Rx and nothing more needed at this time.

## 2018-07-09 ENCOUNTER — Other Ambulatory Visit: Payer: Self-pay

## 2018-07-09 ENCOUNTER — Encounter: Payer: Self-pay | Admitting: Primary Care

## 2018-07-09 ENCOUNTER — Ambulatory Visit (INDEPENDENT_AMBULATORY_CARE_PROVIDER_SITE_OTHER)
Admission: RE | Admit: 2018-07-09 | Discharge: 2018-07-09 | Disposition: A | Discharge status: Home / Self Care | Payer: Medicare HMO | Source: Ambulatory Visit | Attending: Primary Care | Admitting: Primary Care

## 2018-07-09 ENCOUNTER — Ambulatory Visit (INDEPENDENT_AMBULATORY_CARE_PROVIDER_SITE_OTHER): Payer: Medicare HMO | Admitting: Primary Care

## 2018-07-09 VITALS — BP 142/80 | HR 74 | Ht 66.0 in | Wt 189.8 lb

## 2018-07-09 DIAGNOSIS — J438 Other emphysema: Secondary | ICD-10-CM | POA: Diagnosis not present

## 2018-07-09 DIAGNOSIS — J9611 Chronic respiratory failure with hypoxia: Secondary | ICD-10-CM | POA: Diagnosis not present

## 2018-07-09 DIAGNOSIS — J189 Pneumonia, unspecified organism: Secondary | ICD-10-CM | POA: Diagnosis not present

## 2018-07-09 DIAGNOSIS — J449 Chronic obstructive pulmonary disease, unspecified: Secondary | ICD-10-CM | POA: Diagnosis not present

## 2018-07-09 DIAGNOSIS — J441 Chronic obstructive pulmonary disease with (acute) exacerbation: Secondary | ICD-10-CM | POA: Diagnosis not present

## 2018-07-09 DIAGNOSIS — R0602 Shortness of breath: Secondary | ICD-10-CM | POA: Diagnosis not present

## 2018-07-09 MED ORDER — FLUTTER DEVI: 0 refills | Status: AC

## 2018-07-09 MED ORDER — LEVOFLOXACIN 500 MG PO TABS: 500.0000 mg | ORAL_TABLET | Freq: Every day | ORAL | 0 refills | Status: AC

## 2018-07-09 MED ORDER — HYDROCODONE-HOMATROPINE 5-1.5 MG/5ML PO SYRP: 5.0000 mL | ORAL_SOLUTION | Freq: Four times a day (QID) | ORAL | 0 refills | Status: DC | PRN

## 2018-07-09 MED ORDER — PREDNISONE 10 MG PO TABS: ORAL_TABLET | ORAL | 0 refills | Status: DC

## 2018-07-09 NOTE — Patient Instructions (Signed)
Flutter valve three times a day  Start mucinex twice a day  Prescription cough medication at bedtime only for cough  CXR and labs today  FU with Beth in 10 days

## 2018-07-09 NOTE — Addendum Note (Signed)
Addended by: [name] on: 07/09/2018 04:20 PM   Modules accepted: Orders

## 2018-07-09 NOTE — Progress Notes (Signed)
@Patient  ID: Antony Haste, male    DOB: 02/17/1956, 62 y.o.   MRN: [id number]  Chief Complaint  Patient presents with  . Follow-up    chest congestion and cough with mucus    Referring provider: [name], MD  HPI: 62 year old male, current smoker 1ppd. PMH bipolar disorder, HTN, COPD, chronic resp failure with hypoxia, tobacco use, post nasal drip. Low dose chest CT showed RADS 2, repeat in 12 months due feb 2020. Patient of Dr. Marchelle Gearing.  Maintained on Symbicort 160 and Spiriva. PRN ventolin. Last seen on 08/28, treated with doxycylin and prednisone for COPD exacerbation. CXR showed atelectasis, possible early PNA.  07/09/2018 Patient returns for 1-2 week fu COPD exacerbation. Fatigue has improved. Still has congested cough, feels it mostly in his lung bases. States that he gets short of breath with stairs and walking to the bathroom. Using rescue inhaler more than normal, approx 4-5 times a days. Taking Robitussin-DM. States that this is not his baseline. No fever but sweating. Denies cp.    Allergies  Allergen Reactions  . Codeine Nausea And Vomiting    Immunization History  Administered Date(s) Administered  . Influenza Split 09/23/2017  . Influenza,inj,Quad PF,6+ Mos 09/09/2015, 07/14/2016  . Influenza-Unspecified 07/31/2014  . Pneumococcal Polysaccharide-23 10/31/2013    Past Medical History:  Diagnosis Date  . Anxiety   . Arthritis   . Asthma   . Bipolar disorder (HCC)   . COPD (chronic obstructive pulmonary disease) (HCC)   . Depression   . GERD (gastroesophageal reflux disease)   . Headache(784.0)   . Hiatal hernia   . Hypertension     Tobacco History: Social History   Tobacco Use  Smoking Status Current Every Day Smoker  . Packs/day: 1.00  . Years: 48.00  . Pack years: 48.00  . Types: Cigarettes  Smokeless Tobacco Never Used  Tobacco Comment   2-3 cigs per day/  ep   Ready to quit: Not Answered Counseling given: Not Answered Comment: 2-3  cigs per day/  ep   Outpatient Medications Prior to Visit  Medication Sig Dispense Refill  . albuterol (PROVENTIL HFA;VENTOLIN HFA) 108 (90 Base) MCG/ACT inhaler Inhale 2 puffs into the lungs every 4 (four) hours as needed. For shortness of breath 1 Inhaler 5  . albuterol (PROVENTIL) (2.5 MG/3ML) 0.083% nebulizer solution Take 3 mLs (2.5 mg total) by nebulization every 6 (six) hours as needed. For shortness of breath 75 mL 5  . aspirin EC 81 MG tablet Take 1 tablet (81 mg total) by mouth daily.    Marland Kitchen atenolol-chlorthalidone (TENORETIC) 100-25 MG tablet Take 1 tablet by mouth daily.    . budesonide-formoterol (SYMBICORT) 160-4.5 MCG/ACT inhaler Inhale 2 puffs into the lungs 2 (two) times daily. 1 Inhaler 6  . doxycycline (VIBRA-TABS) 100 MG tablet Take 1 tablet (100 mg total) by mouth 2 (two) times daily. 14 tablet 0  . fluticasone (FLONASE) 50 MCG/ACT nasal spray Place 2 sprays into both nostrils daily. 16 g 5  . lithium carbonate 300 MG capsule Take 300 mg by mouth 3 (three) times daily with meals.    . Multiple Vitamin (MULTIVITAMIN WITH MINERALS) TABS tablet Take 1 tablet by mouth daily.    . Omega-3 Fatty Acids (FISH OIL) 500 MG CAPS Take 500 mg by mouth daily.    Marland Kitchen omeprazole (PRILOSEC) 20 MG capsule Take 20 mg by mouth daily as needed (for acid reflux/indigestion).    . Tiotropium Bromide Monohydrate (SPIRIVA RESPIMAT) 2.5 MCG/ACT  AERS INHALE 2 PUFFS INTO THE LUNGS EACH MORNING 1 Inhaler 6  . predniSONE (DELTASONE) 10 MG tablet Take 4 tabs po daily x 2 days; then 3 tabs for 2 days; then 2 tabs for 2 days; then 1 tab for 2 days 20 tablet 0   Facility-Administered Medications Prior to Visit  Medication Dose Route Frequency Provider Last Rate Last Dose  . levalbuterol (XOPENEX) nebulizer solution 0.63 mg  0.63 mg Nebulization Q6H [name], NP        Review of Systems  Review of Systems  Constitutional: Positive for diaphoresis. Negative for appetite change, chills and fever.    HENT: Positive for congestion.   Respiratory: Positive for cough, shortness of breath and wheezing.   Cardiovascular: Negative for chest pain and leg swelling.    Physical Exam  BP (!) 142/80 (BP Location: Left Arm, Cuff Size: Normal)   Pulse 74   Ht 5\' 6"  (1.676 m)   Wt 189 lb 12.8 oz (86.1 kg)   SpO2 95%   BMI 30.63 kg/m  Physical Exam  Constitutional: He is oriented to person, place, and time. He appears well-developed and well-nourished.  HENT:  Head: Normocephalic and atraumatic.  Eyes: Pupils are equal, round, and reactive to light. EOM are normal.  Neck: Normal range of motion. Neck supple.  Cardiovascular: Normal rate and regular rhythm.  Pulmonary/Chest: Effort normal. He has wheezes. He has rales.  LS with rhonchi and wheezes to bil bases.   Musculoskeletal: Normal range of motion.  Neurological: He is alert and oriented to person, place, and time.  Skin: Skin is warm and dry.  Psychiatric: He has a normal mood and affect. His behavior is normal. Judgment and thought content normal.     Lab Results:  CBC    Component Value Date/Time   WBC 16.3 (H) 12/09/2015 0557   RBC 4.38 12/09/2015 0557   HGB 14.0 12/09/2015 0557   HCT 43.6 12/09/2015 0557   PLT 111 (L) 12/09/2015 0557   MCV 99.5 12/09/2015 0557   MCH 32.0 12/09/2015 0557   MCHC 32.1 12/09/2015 0557   RDW 15.2 12/09/2015 0557   LYMPHSABS 0.7 12/08/2015 0551   MONOABS 0.3 12/08/2015 0551   EOSABS 0.0 12/08/2015 0551   BASOSABS 0.0 12/08/2015 0551    BMET    Component Value Date/Time   NA 139 12/09/2015 0557   K 4.7 12/09/2015 0557   CL 109 12/09/2015 0557   CO2 24 12/09/2015 0557   GLUCOSE 176 (H) 12/09/2015 0557   BUN 17 12/09/2015 0557   CREATININE 0.94 12/09/2015 0557   CALCIUM 8.4 (L) 12/09/2015 0557   GFRNONAA >60 12/09/2015 0557   GFRAA >60 12/09/2015 0557    BNP No results found for: BNP  ProBNP    Component Value Date/Time   PROBNP 125.2 (H) 10/31/2013 1645    Imaging: Dg  Chest 2 View  Result Date: 06/25/2018 CLINICAL DATA:  Increase shortness of breath and cough over the past week. History of asthma-COPD, current smoker. EXAM: CHEST - 2 VIEW COMPARISON:  CT scan of the chest of December 28, 2017 and AP portable chest x-ray of December 08, 2015. FINDINGS: The lungs remain hyper inflated. There is patchy increased density lateral to the cardiac apex which is new. The right lung is clear. The heart and pulmonary vascularity are normal. The mediastinum is normal in width. The bony thorax exhibits no acute abnormality. IMPRESSION: COPD-reactive airway disease. Probable subsegmental atelectasis or early pneumonia in the lingula.  Followup PA and lateral chest X-ray is recommended in 3-4 weeks following trial of antibiotic therapy to ensure resolution and exclude underlying malignancy. Electronically Signed   By: David  [place] M.D.   On: 06/25/2018 11:09     Assessment & Plan:   62 year old male, current smoker. PMH COPD Treated for suspected COPD exacerbation on 08/26 with doxycyline and prednisone taper. CXR showed atelectasis and possible early PNA. Presents today for 1-2 week follow up. Fatigue has improved but still has congested cough and sob. Add flutter valve and mucinex BID. Hydromet q6hours with safe precautions. Needs prednisone taper. Depending on repeat CXR today consider Augmentin or levaquin. FU in 10 days or sooner if needed.   Chronic respiratory failure with hypoxia (HCC) O2 sat maintained at 95% RA today  No resp distress   COPD exacerbation (HCC) Treated for suspected COPD exac on 08/26 with doxy and pred taper Repeat CXR and labs today Needs additional prednisone taper Add flutter valve and mucinex Hydromet with safe precautions (reviewed allergy with patient ok to take)    [name], NP 07/09/2018

## 2018-07-09 NOTE — Assessment & Plan Note (Addendum)
Treated for suspected COPD exac on 08/26 with doxy and pred taper Repeat CXR and labs today Needs additional prednisone taper Add flutter valve and mucinex Hydromet with safe precautions (reviewed allergy with patient ok to take)

## 2018-07-09 NOTE — Assessment & Plan Note (Signed)
O2 sat maintained at 95% RA today  No resp distress

## 2018-07-10 ENCOUNTER — Telehealth: Payer: Self-pay | Admitting: Primary Care

## 2018-07-10 NOTE — Telephone Encounter (Signed)
Notes recorded by Earvin Hansen, RN on 07/10/2018 at 9:32 AM EDT Spoke with pt. Relayed persistent COPD exacerbation. Instructed to pick up levaquin at pharmacy and call if he does not feel better. F/u appt on 9/19. Nothing further needed. ------  Notes recorded by Earvin Hansen, RN on 07/09/2018 at 4:39 PM EDT LMOM to call back ------  Notes recorded by [name], NP on 07/09/2018 at 4:19 PM EDT CXR was clear, however, I think he needs an additional abx for persistent COPD exacerbation. Will send Levaquin to pharmacy. Take 1 tab daily x 5 days.      Encounter-Level Documents:   There are no encounter-level documents.  Order-Level Documents:   There are no order-level documents.  Vitals   Height Weight BMI (Calculated)  5\' 6"  (1.676 m) 189 lb 12.8 oz (86.1 kg) 30.65  Protocol Documents   Imaging Protocol  Imaging   Imaging Information  Resulted by:   Signed Date/Time  Phone Pager  Roque Lias 07/09/2018 1:49 PM [phone]  Result Notes for DG Chest 2 View   Notes recorded by Earvin Hansen, RN on 07/10/2018 at 9:32 AM EDT Spoke with pt. Relayed persistent COPD exacerbation. Instructed to pick up levaquin at pharmacy and call if he does not feel better. F/u appt on 9/19. Nothing further needed. ------  Notes recorded by Earvin Hansen, RN on 07/09/2018 at 4:39 PM EDT LMOM to call back ------  Notes recorded by [name], NP on 07/09/2018 at 4:19 PM EDT CXR was clear, however, I think he needs an additional abx for persistent COPD exacerbation. Will send Levaquin to pharmacy. Take 1 tab daily x 5 days.

## 2018-07-19 ENCOUNTER — Ambulatory Visit (INDEPENDENT_AMBULATORY_CARE_PROVIDER_SITE_OTHER): Payer: Medicare HMO | Admitting: Primary Care

## 2018-07-19 ENCOUNTER — Encounter: Payer: Self-pay | Admitting: Primary Care

## 2018-07-19 ENCOUNTER — Telehealth: Payer: Self-pay

## 2018-07-19 VITALS — BP 144/90 | HR 103 | Wt 191.4 lb

## 2018-07-19 DIAGNOSIS — J449 Chronic obstructive pulmonary disease, unspecified: Secondary | ICD-10-CM

## 2018-07-19 DIAGNOSIS — R9389 Abnormal findings on diagnostic imaging of other specified body structures: Secondary | ICD-10-CM | POA: Insufficient documentation

## 2018-07-19 DIAGNOSIS — Z23 Encounter for immunization: Secondary | ICD-10-CM | POA: Diagnosis not present

## 2018-07-19 DIAGNOSIS — J441 Chronic obstructive pulmonary disease with (acute) exacerbation: Secondary | ICD-10-CM | POA: Diagnosis not present

## 2018-07-19 MED ORDER — SODIUM CHLORIDE 3 % IN NEBU: INHALATION_SOLUTION | RESPIRATORY_TRACT | 3 refills | Status: DC

## 2018-07-19 MED ORDER — ALBUTEROL SULFATE HFA 108 (90 BASE) MCG/ACT IN AERS: 2.0000 | INHALATION_SPRAY | RESPIRATORY_TRACT | 5 refills | Status: DC | PRN

## 2018-07-19 NOTE — Addendum Note (Signed)
Addended by: Earvin HansenONNOLLY, Siya Flurry M on: 07/19/2018 10:04 AM   Modules accepted: Orders

## 2018-07-19 NOTE — Assessment & Plan Note (Addendum)
-   HRCT 11/2015 showed features of MAI. No definite ILD, scattered areas of mild cylindrical bronchiectasis.   - Add hypertonic saline neb BID - May benefit from smart vest if congestion continues - Needs sputum culture

## 2018-07-19 NOTE — Progress Notes (Signed)
@Patient  ID: Glen HasteKenneth P Lopez, male    DOB: 12/17/1955, 62 y.o.   MRN: [id number]  Chief Complaint  Patient presents with  . Follow-up    no energy-SOB with exertion-cough with clear mucus x1 month    Referring provider: Deatra JamesSun, Vyvyan, MD  HPI: 62 year old male, current smoker 1ppd. PMH bipolar disorder, HTN, COPD, chronic resp failure with hypoxia, tobacco use, post nasal drip. Low dose chest CT showed RADS 2, repeat in 12 months due feb 2020. Patient of Dr. Marchelle Gearingamaswamy.  Maintained on Symbicort 160 and Spiriva. PRN ventolin.   06/25/2018 Patient presents today for acute visit with complaints of shortness of breath x1 week with rest and activity. Normally he is functional, maintained on symbicort 160 and Spiriva. Needs new neb machine, has had it for over 5 years. Reports that it quit working and there is a burning smell coming from it. Wants different DME company. BP is elevated today. He had a cigarette before today's visit and he did not take blood pressure medication. Still smoking, 3-4 cig a day (previous 2ppd). Treated for COPD exacerbation with doxycycline and prednisone taper. CXR showed COPD-reactive airway disease. Probable subsegmental atelectasis or early pneumonia in the lingula. Followup PA and lateral chest X-ray is recommended in 3-4 weeks   07/09/2018 Patient returns for 1-2 week fu COPD exacerbation. Fatigue has improved. Still has congested cough, feels it mostly in his lung bases. States that he gets short of breath with stairs and walking to the bathroom. Using rescue inhaler more than normal, approx 4-5 times a days. Taking Robitussin-DM. States that this is not his baseline. No fever but sweating. Denies cp. Mucinex added. Extended prednisone and started on levaquin for COPD exacerbation. CXR repeated and showed no active process.   07/19/2018 Patient presents today for 10 fu regarding persistent COPD exacerbation. Patient reports that he is feeling a little better but still  fatigues easily with activity. Continues to have congested cough. Has trouble getting mucus up when coughing which exacerbates his breathing. Takes Symbicort 160 and Spiriva as directed. Using albuterol inhaler every 4 hours. Continues mucinex twice daily and using flutter valve. Still smoking but has cutt back to less than 1/2 pack per day. Denies fever or chills.     Allergies  Allergen Reactions  . Codeine Nausea And Vomiting    Immunization History  Administered Date(s) Administered  . Influenza Split 09/23/2017  . Influenza, High Dose Seasonal PF 07/19/2018  . Influenza,inj,Quad PF,6+ Mos 09/09/2015, 07/14/2016  . Influenza-Unspecified 07/31/2014  . Pneumococcal Conjugate-13 07/19/2018  . Pneumococcal Polysaccharide-23 10/31/2013    Past Medical History:  Diagnosis Date  . Anxiety   . Arthritis   . Asthma   . Bipolar disorder (HCC)   . COPD (chronic obstructive pulmonary disease) (HCC)   . Depression   . GERD (gastroesophageal reflux disease)   . Headache(784.0)   . Hiatal hernia   . Hypertension     Tobacco History: Social History   Tobacco Use  Smoking Status Current Every Day Smoker  . Packs/day: 0.50  . Years: 48.00  . Pack years: 24.00  . Types: Cigarettes  Smokeless Tobacco Never Used  Tobacco Comment   2-3 cigs per day/  ep   Ready to quit: Not Answered Counseling given: Not Answered Comment: 2-3 cigs per day/  ep   Outpatient Medications Prior to Visit  Medication Sig Dispense Refill  . albuterol (PROVENTIL) (2.5 MG/3ML) 0.083% nebulizer solution Take 3 mLs (2.5 mg total) by  nebulization every 6 (six) hours as needed. For shortness of breath 75 mL 5  . aspirin EC 81 MG tablet Take 1 tablet (81 mg total) by mouth daily.    Marland Kitchen atenolol-chlorthalidone (TENORETIC) 100-25 MG tablet Take 1 tablet by mouth daily.    . budesonide-formoterol (SYMBICORT) 160-4.5 MCG/ACT inhaler Inhale 2 puffs into the lungs 2 (two) times daily. 1 Inhaler 6  . fluticasone  (FLONASE) 50 MCG/ACT nasal spray Place 2 sprays into both nostrils daily. 16 g 5  . HYDROcodone-homatropine (HYCODAN) 5-1.5 MG/5ML syrup Take 5 mLs by mouth every 6 (six) hours as needed for cough. 240 mL 0  . lithium carbonate 300 MG capsule Take 300 mg by mouth 3 (three) times daily with meals.    . Multiple Vitamin (MULTIVITAMIN WITH MINERALS) TABS tablet Take 1 tablet by mouth daily.    . Omega-3 Fatty Acids (FISH OIL) 500 MG CAPS Take 500 mg by mouth daily.    Marland Kitchen omeprazole (PRILOSEC) 20 MG capsule Take 20 mg by mouth daily as needed (for acid reflux/indigestion).    Marland Kitchen Respiratory Therapy Supplies (FLUTTER) DEVI Use 3 times a day. 1 each 0  . Tiotropium Bromide Monohydrate (SPIRIVA RESPIMAT) 2.5 MCG/ACT AERS INHALE 2 PUFFS INTO THE LUNGS EACH MORNING 1 Inhaler 6  . albuterol (PROVENTIL HFA;VENTOLIN HFA) 108 (90 Base) MCG/ACT inhaler Inhale 2 puffs into the lungs every 4 (four) hours as needed. For shortness of breath 1 Inhaler 5  . doxycycline (VIBRA-TABS) 100 MG tablet Take 1 tablet (100 mg total) by mouth 2 (two) times daily. 14 tablet 0  . predniSONE (DELTASONE) 10 MG tablet Take 4 tabs po daily x 3 days; then 3 tabs daily x3 days; then 2 tabs daily x3 days; then 1 tab daily x 3 days; then stop 30 tablet 0   Facility-Administered Medications Prior to Visit  Medication Dose Route Frequency Provider Last Rate Last Dose  . levalbuterol (XOPENEX) nebulizer solution 0.63 mg  0.63 mg Nebulization Q6H [name], NP          Review of Systems  Review of Systems   Physical Exam  BP (!) 144/90 (BP Location: Left Arm, Cuff Size: Normal)   Pulse (!) 103   Wt 191 lb 6.4 oz (86.8 kg)   SpO2 92%   BMI 30.89 kg/m  Physical Exam  Constitutional: He is oriented to person, place, and time. He appears well-developed and well-nourished. No distress.  Appears well  HENT:  Head: Normocephalic and atraumatic.  Neck: Normal range of motion. Neck supple.  Cardiovascular: Regular rhythm.    Pulmonary/Chest: Effort normal. No respiratory distress. He has wheezes.  LS with mild exp wheeze and scattered rhonchi. NO resp distress.   Musculoskeletal: Normal range of motion.  Neurological: He is alert and oriented to person, place, and time.  Skin: Skin is warm and dry.  Psychiatric: He has a normal mood and affect. His behavior is normal. Judgment and thought content normal.     Lab Results:  CBC    Component Value Date/Time   WBC 16.3 (H) 12/09/2015 0557   RBC 4.38 12/09/2015 0557   HGB 14.0 12/09/2015 0557   HCT 43.6 12/09/2015 0557   PLT 111 (L) 12/09/2015 0557   MCV 99.5 12/09/2015 0557   MCH 32.0 12/09/2015 0557   MCHC 32.1 12/09/2015 0557   RDW 15.2 12/09/2015 0557   LYMPHSABS 0.7 12/08/2015 0551   MONOABS 0.3 12/08/2015 0551   EOSABS 0.0 12/08/2015 0551   BASOSABS  0.0 12/08/2015 0551    BMET    Component Value Date/Time   NA 139 12/09/2015 0557   K 4.7 12/09/2015 0557   CL 109 12/09/2015 0557   CO2 24 12/09/2015 0557   GLUCOSE 176 (H) 12/09/2015 0557   BUN 17 12/09/2015 0557   CREATININE 0.94 12/09/2015 0557   CALCIUM 8.4 (L) 12/09/2015 0557   GFRNONAA >60 12/09/2015 0557   GFRAA >60 12/09/2015 0557    BNP No results found for: BNP  ProBNP    Component Value Date/Time   PROBNP 125.2 (H) 10/31/2013 1645    Imaging: Dg Chest 2 View  Result Date: 07/09/2018 CLINICAL DATA:  Shortness of breath, pneumonia. EXAM: CHEST - 2 VIEW COMPARISON:  Radiographs of June 25, 2018. FINDINGS: The heart size and mediastinal contours are within normal limits. Both lungs are clear. No pneumothorax or pleural effusion is noted. The visualized skeletal structures are unremarkable. IMPRESSION: No active cardiopulmonary disease. Electronically Signed   By: Lupita Raider, M.D.   On: 07/09/2018 13:49   Dg Chest 2 View  Result Date: 06/25/2018 CLINICAL DATA:  Increase shortness of breath and cough over the past week. History of asthma-COPD, current smoker. EXAM:  CHEST - 2 VIEW COMPARISON:  CT scan of the chest of December 28, 2017 and AP portable chest x-ray of December 08, 2015. FINDINGS: The lungs remain hyper inflated. There is patchy increased density lateral to the cardiac apex which is new. The right lung is clear. The heart and pulmonary vascularity are normal. The mediastinum is normal in width. The bony thorax exhibits no acute abnormality. IMPRESSION: COPD-reactive airway disease. Probable subsegmental atelectasis or early pneumonia in the lingula. Followup PA and lateral chest X-ray is recommended in 3-4 weeks following trial of antibiotic therapy to ensure resolution and exclude underlying malignancy. Electronically Signed   By: David  [place] M.D.   On: 06/25/2018 11:09     Assessment & Plan:   COPD, severe (HCC) - COPD GOLD III - Continues Symbicort 160 twice day and Spiriva 2.5 qd  - Refer to pulmonary rehab - Consider adding daliresp or daily macrolid if continues to exacerbate  - Has home oxygen, can use during the day to keep O2 sat >90% - Smoking cessation strongly encouraged - FU in 2 months PFTs and OV with Dr. Marchelle Gearing    Abnormal CT of the chest - HRCT 11/2015 showed features of MAI. No definite ILD, scattered areas of mild cylindrical bronchiectasis.   - Add hypertonic saline neb BID - May benefit from smart vest if congestion continues - Needs sputum culture      [name], NP 07/19/2018

## 2018-07-19 NOTE — Telephone Encounter (Signed)
Left sputum cups at front desk along with instructions.  Nothing further needed.

## 2018-07-19 NOTE — Assessment & Plan Note (Addendum)
-   COPD GOLD III - Continues Symbicort 160 twice day and Spiriva 2.5 qd  - Refer to pulmonary rehab - Consider adding daliresp or daily macrolid if continues to exacerbate  - Has home oxygen, can use during the day to keep O2 sat >90% - Smoking cessation strongly encouraged - FU in 2 months PFTs and OV with Dr. Marchelle Gearingamaswamy

## 2018-07-19 NOTE — Patient Instructions (Addendum)
Follow up in 2 months with Dr. Marchelle Gearingamaswamy with PFTs prior   Please refer to Pulmonary rehab   Received pneumococcal 13 and flu shot today   Albuterol rescue inhaler refill sent ( use 2 puffs every 4 hours as needed for shortness of breath)  Continue mucinex twice a day and flutter valve   BEST thing you can do is stop smoking. Pick a day when ready and try quitting!     Steps to Quit Smoking Smoking tobacco can be harmful to your health and can affect almost every organ in your body. Smoking puts you, and those around you, at risk for developing many serious chronic diseases. Quitting smoking is difficult, but it is one of the best things that you can do for your health. It is never too late to quit. What are the benefits of quitting smoking? When you quit smoking, you lower your risk of developing serious diseases and conditions, such as:  Lung cancer or lung disease, such as COPD.  Heart disease.  Stroke.  Heart attack.  Infertility.  Osteoporosis and bone fractures.  Additionally, symptoms such as coughing, wheezing, and shortness of breath may get better when you quit. You may also find that you get sick less often because your body is stronger at fighting off colds and infections. If you are pregnant, quitting smoking can help to reduce your chances of having a baby of low birth weight. How do I get ready to quit? When you decide to quit smoking, create a plan to make sure that you are successful. Before you quit:  Pick a date to quit. Set a date within the next two weeks to give you time to prepare.  Write down the reasons why you are quitting. Keep this list in places where you will see it often, such as on your bathroom mirror or in your car or wallet.  Identify the people, places, things, and activities that make you want to smoke (triggers) and avoid them. Make sure to take these actions: ? Throw away all cigarettes at home, at work, and in your car. ? Throw away  smoking accessories, such as Set designerashtrays and lighters. ? Clean your car and make sure to empty the ashtray. ? Clean your home, including curtains and carpets.  Tell your family, friends, and coworkers that you are quitting. Support from your loved ones can make quitting easier.  Talk with your health care provider about your options for quitting smoking.  Find out what treatment options are covered by your health insurance.  What strategies can I use to quit smoking? Talk with your healthcare provider about different strategies to quit smoking. Some strategies include:  Quitting smoking altogether instead of gradually lessening how much you smoke over a period of time. Research shows that quitting "cold [place]key" is more successful than gradually quitting.  Attending in-person counseling to help you build problem-solving skills. You are more likely to have success in quitting if you attend several counseling sessions. Even short sessions of 10 minutes can be effective.  Finding resources and support systems that can help you to quit smoking and remain smoke-free after you quit. These resources are most helpful when you use them often. They can include: ? Online chats with a Veterinary surgeoncounselor. ? Telephone quitlines. ? Automotive engineerrinted self-help materials. ? Support groups or group counseling. ? Text messaging programs. ? Mobile phone applications.  Taking medicines to help you quit smoking. (If you are pregnant or breastfeeding, talk with your health care provider  first.) Some medicines contain nicotine and some do not. Both types of medicines help with cravings, but the medicines that include nicotine help to relieve withdrawal symptoms. Your health care provider may recommend: ? Nicotine patches, gum, or lozenges. ? Nicotine inhalers or sprays. ? Non-nicotine medicine that is taken by mouth.  Talk with your health care provider about combining strategies, such as taking medicines while you are also  receiving in-person counseling. Using these two strategies together makes you more likely to succeed in quitting than if you used either strategy on its own. If you are pregnant or breastfeeding, talk with your health care provider about finding counseling or other support strategies to quit smoking. Do not take medicine to help you quit smoking unless told to do so by your health care provider. What things can I do to make it easier to quit? Quitting smoking might feel overwhelming at first, but there is a lot that you can do to make it easier. Take these important actions:  Reach out to your family and friends and ask that they support and encourage you during this time. Call telephone quitlines, reach out to support groups, or work with a counselor for support.  Ask people who smoke to avoid smoking around you.  Avoid places that trigger you to smoke, such as bars, parties, or smoke-break areas at work.  Spend time around people who do not smoke.  Lessen stress in your life, because stress can be a smoking trigger for some people. To lessen stress, try: ? Exercising regularly. ? Deep-breathing exercises. ? Yoga. ? Meditating. ? Performing a body scan. This involves closing your eyes, scanning your body from head to toe, and noticing which parts of your body are particularly tense. Purposefully relax the muscles in those areas.  Download or purchase mobile phone or tablet apps (applications) that can help you stick to your quit plan by providing reminders, tips, and encouragement. There are many free apps, such as QuitGuide from the [employer] developer for Disease Control and Prevention). You can find other support for quitting smoking (smoking cessation) through smokefree.gov and other websites.  How will I feel when I quit smoking? Within the first 24 hours of quitting smoking, you may start to feel some withdrawal symptoms. These symptoms are usually most noticeable 2-3 days after quitting, but  they usually do not last beyond 2-3 weeks. Changes or symptoms that you might experience include:  Mood swings.  Restlessness, anxiety, or irritation.  Difficulty concentrating.  Dizziness.  Strong cravings for sugary foods in addition to nicotine.  Mild weight gain.  Constipation.  Nausea.  Coughing or a sore throat.  Changes in how your medicines work in your body.  A depressed mood.  Difficulty sleeping (insomnia).  After the first 2-3 weeks of quitting, you may start to notice more positive results, such as:  Improved sense of smell and taste.  Decreased coughing and sore throat.  Slower heart rate.  Lower blood pressure.  Clearer skin.  The ability to breathe more easily.  Fewer sick days.  Quitting smoking is very challenging for most people. Do not get discouraged if you are not successful the first time. Some people need to make many attempts to quit before they achieve long-term success. Do your best to stick to your quit plan, and talk with your health care provider if you have any questions or concerns. This information is not intended to replace advice given to you by your health care provider. Make sure  you discuss any questions you have with your health care provider. Document Released: 10/11/2001 Document Revised: 06/14/2016 Document Reviewed: 03/03/2015 Elsevier Interactive Patient Education  [employer].

## 2018-07-20 ENCOUNTER — Other Ambulatory Visit: Payer: Self-pay

## 2018-07-20 DIAGNOSIS — J449 Chronic obstructive pulmonary disease, unspecified: Secondary | ICD-10-CM

## 2018-07-20 DIAGNOSIS — R0602 Shortness of breath: Secondary | ICD-10-CM

## 2018-07-24 ENCOUNTER — Telehealth (HOSPITAL_COMMUNITY): Payer: Self-pay

## 2018-07-24 NOTE — Telephone Encounter (Signed)
Called and spoke with patient in regards to Pulmonary Rehab - Scheduled orientation on 08/24/18 at 9:30am. Patient will attend the 10:30am exc class. Mailed packet.

## 2018-07-27 DIAGNOSIS — J449 Chronic obstructive pulmonary disease, unspecified: Secondary | ICD-10-CM | POA: Diagnosis not present

## 2018-08-24 ENCOUNTER — Telehealth (HOSPITAL_COMMUNITY): Payer: Self-pay

## 2018-08-24 ENCOUNTER — Ambulatory Visit (HOSPITAL_COMMUNITY): Payer: Medicare HMO

## 2018-08-24 NOTE — Telephone Encounter (Signed)
Attempted to call pt to reschedule Pulmonary Rehab. LM on VM

## 2018-08-26 DIAGNOSIS — J449 Chronic obstructive pulmonary disease, unspecified: Secondary | ICD-10-CM | POA: Diagnosis not present

## 2018-08-31 ENCOUNTER — Telehealth (HOSPITAL_COMMUNITY): Payer: Self-pay

## 2018-08-31 NOTE — Telephone Encounter (Signed)
2nd attempt to contact pt to reschedule orientation - lm on vm. Sending letter.

## 2018-09-04 ENCOUNTER — Telehealth (HOSPITAL_COMMUNITY): Payer: Self-pay

## 2018-09-04 NOTE — Telephone Encounter (Signed)
3rd attempt to contact pt in regards to rescheduling PR orientation - lm on vm

## 2018-09-24 ENCOUNTER — Ambulatory Visit (INDEPENDENT_AMBULATORY_CARE_PROVIDER_SITE_OTHER): Payer: Medicare HMO | Admitting: Internal Medicine

## 2018-09-24 ENCOUNTER — Encounter: Payer: Self-pay | Admitting: Internal Medicine

## 2018-09-24 ENCOUNTER — Ambulatory Visit: Payer: Medicare HMO | Admitting: Internal Medicine

## 2018-09-24 ENCOUNTER — Other Ambulatory Visit: Payer: Self-pay | Admitting: Internal Medicine

## 2018-09-24 VITALS — BP 128/72 | HR 77 | Ht 64.5 in | Wt 190.0 lb

## 2018-09-24 DIAGNOSIS — Z129 Encounter for screening for malignant neoplasm, site unspecified: Secondary | ICD-10-CM

## 2018-09-24 DIAGNOSIS — F17210 Nicotine dependence, cigarettes, uncomplicated: Secondary | ICD-10-CM | POA: Diagnosis not present

## 2018-09-24 DIAGNOSIS — J441 Chronic obstructive pulmonary disease with (acute) exacerbation: Secondary | ICD-10-CM

## 2018-09-24 DIAGNOSIS — J449 Chronic obstructive pulmonary disease, unspecified: Secondary | ICD-10-CM | POA: Diagnosis not present

## 2018-09-24 LAB — PULMONARY FUNCTION TEST
DL/VA % pred: 61 %
DL/VA: 2.62 ml/min/mmHg/L
DLCO unc % pred: 52 %
DLCO unc: 13.12 ml/min/mmHg
FEF 25-75 Post: 0.75 L/sec
FEF 25-75 Pre: 0.41 L/sec
FEF2575-%CHANGE-POST: 80 %
FEF2575-%PRED-PRE: 17 %
FEF2575-%Pred-Post: 31 %
FEV1-%Change-Post: 32 %
FEV1-%PRED-POST: 47 %
FEV1-%Pred-Pre: 35 %
FEV1-PRE: 1.03 L
FEV1-Post: 1.36 L
FEV1FVC-%CHANGE-POST: 11 %
FEV1FVC-%Pred-Pre: 62 %
FEV6-%Change-Post: 18 %
FEV6-%PRED-PRE: 58 %
FEV6-%Pred-Post: 69 %
FEV6-PRE: 2.11 L
FEV6-Post: 2.5 L
FEV6FVC-%Change-Post: 0 %
FEV6FVC-%PRED-PRE: 102 %
FEV6FVC-%Pred-Post: 101 %
FVC-%Change-Post: 18 %
FVC-%PRED-PRE: 57 %
FVC-%Pred-Post: 67 %
FVC-POST: 2.59 L
FVC-PRE: 2.18 L
POST FEV6/FVC RATIO: 96 %
PRE FEV1/FVC RATIO: 47 %
PRE FEV6/FVC RATIO: 97 %
Post FEV1/FVC ratio: 53 %
RV % pred: 225 %
RV: 4.47 L
TLC % pred: 124 %
TLC: 7.32 L

## 2018-09-24 MED ORDER — FLUTICASONE-UMECLIDIN-VILANT 100-62.5-25 MCG/INH IN AEPB: 1.0000 | INHALATION_SPRAY | Freq: Every day | RESPIRATORY_TRACT | 3 refills | Status: DC

## 2018-09-24 MED ORDER — FLUTICASONE-UMECLIDIN-VILANT 100-62.5-25 MCG/INH IN AEPB: 1.0000 | INHALATION_SPRAY | Freq: Every day | RESPIRATORY_TRACT | 0 refills | Status: DC

## 2018-09-24 MED ORDER — NYSTATIN 100000 UNIT/ML MT SUSP: 5.0000 mL | Freq: Four times a day (QID) | OROMUCOSAL | 0 refills | Status: DC

## 2018-09-24 MED ORDER — PREDNISONE 10 MG PO TABS: ORAL_TABLET | ORAL | 0 refills | Status: DC

## 2018-09-24 NOTE — Patient Instructions (Addendum)
ICD-10-CM   1. Stage 3 severe COPD by GOLD classification (HCC) J44.9   2. COPD exacerbation (HCC) J44.1   3. Cigarette smoker F17.210   4. Cancer screening Z12.9    Stage 3 severe COPD by GOLD classification (HCC) - given high symptom burden and recurrent flare ups change spiriva and symbicort to TRELEGY once daily - continue ventolin as needed - glad uptodate with flu shot - quit smoking - stop fish oil - can cause night acid reflux than can impact lungs - talk to Andris Baumannpcp Sun, Vyvyan, MD and stop atenolol because it can affect your lungs - refill for thursh Rx if needed   - # Oral thrush - For Oral thrush: Take Suspension (swish and swallow): 500,000 units 4 times/day for 5 days; swish in the mouth and retain for as long as possible (several minutes) before swallowing.   COPD exacerbation (HCC) - Please take prednisone 40 mg x1 day, then 30 mg x1 day, then 20 mg x1 day, then 10 mg x1 day, and then 5 mg x1 day and stop   Cigarette smoker - quit smoking asap  Lung cancer screening -  Keep CT Chest for march 2020  Followup March 2020 after CT chest

## 2018-09-24 NOTE — Progress Notes (Signed)
OV 07/14/2016  Chief Complaint  Patient presents with  . Follow-up    9 month COPD follow up - reports breathing is near baseline with the change of seasons.    Fu copd - mod/severe with CT features of MAI early 2017 Fu smoking  Follows with ? Wife. Wants flu shot. Wants med refills. Cotninues to smoke; unable to quit. In feb 2017 says admitted for ae-copd and this scared him. Says now for months increased wheeze and yellow sputum and also cough with sinus drainage. He is off opioids. Does not want to go back to opioids due to addiction but cough is worse.    OV 11/23/2017  Chief Complaint  Patient presents with  . Follow-up    Last seen 07/14/16. Pt states he has had a lot of congestion in head and chest, coughing with green mucus, and a lot of chest discomfort that has been happenng x5 days.  62 year old male with COPD not otherwise specified.  Features of MAI in 2017.  Active smoker  This is a routine visit.  Is on Spiriva and Symbicort which he says he is compliant with. He continues to smoke.  Has difficulty quitting.  He tells me that for the last 5 or 6 days he has had increased cough congestion wheezing yellow green sputum and severe shortness of breath.  There is no fever or chills.  No flu exposure.  COPD CAT score is documented below and significantly high.   06/25/2018 Patient presents today for acute visit with complaints of shortness of breath x1 week with rest and activity. Normally he is functional, maintained on symbicort 160 and Spiriva. Needs new neb machine, has had it for over 5 years. Reports that it quit working and there is a burning smell coming from it. Wants different DME company. BP is elevated today. He had a cigarette before today's visit and he did not take blood pressure medication. Still smoking, 3-4 cig a day (previous 2ppd). Treated for COPD exacerbation with doxycycline and prednisone taper. CXR showed COPD-reactive airway disease. Probable  subsegmental atelectasis or early pneumonia in the lingula. Followup PA and lateral chest X-ray is recommended in 3-4 weeks   07/09/2018 Patient returns for 1-2 week fu COPD exacerbation. Fatigue has improved. Still has congested cough, feels it mostly in his lung bases. States that he gets short of breath with stairs and walking to the bathroom. Using rescue inhaler more than normal, approx 4-5 times a days. Taking Robitussin-DM. States that this is not his baseline. No fever but sweating. Denies cp. Mucinex added. Extended prednisone and started on levaquin for COPD exacerbation. CXR repeated and showed no active process.   07/19/2018 Patient presents today for 10 fu regarding persistent COPD exacerbation. Patient reports that he is feeling a little better but still fatigues easily with activity. Continues to have congested cough. Has trouble getting mucus up when coughing which exacerbates his breathing. Takes Symbicort 160 and Spiriva as directed. Using albuterol inhaler every 4 hours. Continues mucinex twice daily and using flutter valve. Still smoking but has cutt back to less than 1/2 pack per day. Denies fever or chills.    OV 09/24/2018  Subjective:  Patient ID: Glen Lopez, male , DOB: 1956-02-25 , age 62 y.o. , MRN: [id number] , ADDRESS: [address] [name] [address]   09/24/2018 -   Chief Complaint  Patient presents with  . Follow-up    breathing same, little cough, SOB on exertion, little  chest tightness and wheezing     HPI Glen Lopez 62 y.o. -presents for follow-up of his advanced COPD.,  Smoking and follow-up of lung cancer screening  COPD advance: He had pulmonary function test today and it shows Gold stage III COPD with significant bronchodilator response.  He is on Spiriva and Symbicort.  Med review also shows that he is taking fish oil which might be causing acid reflux at night but he denies.  He is also on atenolol nonspecific beta-blocker.  He is  up-to-date with his flu shot.  He is willing to change to Trelegy inhaler  Smoking: He continues to smoke.  He says he smokes occasionally and only little.  However the entire room is smelling of tobacco.   Lung cancer screening: He had lung RADS-2 findings in February 2019.  His neck CT scan of the chest is set up for March 2020  On exam he was wheezing and then he admitted that he has slightly worsening symptoms than baseline of COPD with white and yellow sputum.  He thinks 5 days of prednisone would be okay although he is wary of taking longer prednisone because of weight gain and appetite change.     CAT COPD Symptom & Quality of Life Score (GSK trademark) 0 is no burden. 5 is highest burden 11/23/2017  09/24/2018   Never Cough -> Cough all the time 5 3  No phlegm in chest -> Chest is full of phlegm 5 4  No chest tightness -> Chest feels very tight 5 2  No dyspnea for 1 flight stairs/hill -> Very dyspneic for 1 flight of stairs 5 5  No limitations for ADL at home -> Very limited with ADL at home 2 4  Confident leaving home -> Not at all confident leaving home 5 2  Sleep soundly -> Do not sleep soundly because of lung condition 2 4  Lots of Energy -> No energy at all 5 5  TOTAL Score (max 40)  34 34    ROS - per HPI     has a past medical history of Anxiety, Arthritis, Asthma, Bipolar disorder (HCC), COPD (chronic obstructive pulmonary disease) (HCC), Depression, GERD (gastroesophageal reflux disease), Headache(784.0), Hiatal hernia, and Hypertension.   reports that he has been smoking cigarettes. He has a 24.00 pack-year smoking history. He has never used smokeless tobacco.  Past Surgical History:  Procedure Laterality Date  . artheroscopic knee surgery R Right 01/2013    Allergies  Allergen Reactions  . Codeine Nausea And Vomiting    Immunization History  Administered Date(s) Administered  . Influenza Split 09/23/2017  . Influenza, High Dose Seasonal PF 07/19/2018    . Influenza,inj,Quad PF,6+ Mos 09/09/2015, 07/14/2016  . Influenza-Unspecified 07/31/2014  . Pneumococcal Conjugate-13 07/19/2018  . Pneumococcal Polysaccharide-23 10/31/2013    Family History  Problem Relation Age of Onset  . Heart disease Father        MI at age 62  . Asthma Sister      Current Outpatient Medications:  .  albuterol (PROVENTIL HFA;VENTOLIN HFA) 108 (90 Base) MCG/ACT inhaler, Inhale 2 puffs into the lungs every 4 (four) hours as needed. For shortness of breath, Disp: 2 Inhaler, Rfl: 5 .  albuterol (PROVENTIL) (2.5 MG/3ML) 0.083% nebulizer solution, Take 3 mLs (2.5 mg total) by nebulization every 6 (six) hours as needed. For shortness of breath, Disp: 75 mL, Rfl: 5 .  aspirin EC 81 MG tablet, Take 1 tablet (81 mg total) by mouth daily., Disp: ,  Rfl:  .  atenolol-chlorthalidone (TENORETIC) 100-25 MG tablet, Take 1 tablet by mouth daily., Disp: , Rfl:  .  budesonide-formoterol (SYMBICORT) 160-4.5 MCG/ACT inhaler, Inhale 2 puffs into the lungs 2 (two) times daily., Disp: 1 Inhaler, Rfl: 6 .  fluticasone (FLONASE) 50 MCG/ACT nasal spray, Place 2 sprays into both nostrils daily., Disp: 16 g, Rfl: 5 .  lithium carbonate 300 MG capsule, Take 300 mg by mouth 3 (three) times daily with meals., Disp: , Rfl:  .  Multiple Vitamin (MULTIVITAMIN WITH MINERALS) TABS tablet, Take 1 tablet by mouth daily., Disp: , Rfl:  .  Omega-3 Fatty Acids (FISH OIL) 500 MG CAPS, Take 500 mg by mouth daily., Disp: , Rfl:  .  omeprazole (PRILOSEC) 20 MG capsule, Take 20 mg by mouth daily as needed (for acid reflux/indigestion)., Disp: , Rfl:  .  Respiratory Therapy Supplies (FLUTTER) DEVI, Use 3 times a day., Disp: 1 each, Rfl: 0 .  sodium chloride HYPERTONIC 3 % nebulizer solution, Use twice a day as needed for congestion/cough, Disp: 750 mL, Rfl: 3 .  Tiotropium Bromide Monohydrate (SPIRIVA RESPIMAT) 2.5 MCG/ACT AERS, INHALE 2 PUFFS INTO THE LUNGS EACH MORNING, Disp: 1 Inhaler, Rfl: 6  Current  Facility-Administered Medications:  .  levalbuterol (XOPENEX) nebulizer solution 0.63 mg, 0.63 mg, Nebulization, Q6H, [name], NP      Objective:   Vitals:   09/24/18 1001  BP: 128/72  Pulse: 77  SpO2: 94%  Weight: 190 lb (86.2 kg)  Height: 5' 4.5" (1.638 m)    Estimated body mass index is 32.11 kg/m as calculated from the following:   Height as of this encounter: 5' 4.5" (1.638 m).   Weight as of this encounter: 190 lb (86.2 kg).  @WEIGHTCHANGE @  [employer]   09/24/18 1001  Weight: 190 lb (86.2 kg)     Physical Exam  General Appearance:    Alert, cooperative, no distress, appears stated age - no looks older , Deconditioned looking - no , OBESE  - yes, Sitting on Wheelchair -  no  Head:    Normocephalic, without obvious abnormality, atraumatic  Eyes:    PERRL, conjunctiva/corneas clear,  Ears:    Normal TM's and external ear canals, both ears  Nose:   Nares normal, septum midline, mucosa normal, no drainage    or sinus tenderness. OXYGEN ON  - no . Patient is @ ra   Throat:   Lips, mucosa, and tongue normal; teeth and gums normal. Cyanosis on lips - no  Neck:   Supple, symmetrical, trachea midline, no adenopathy;    thyroid:  no enlargement/tenderness/nodules; no carotid   bruit or JVD  Back:     Symmetric, no curvature, ROM normal, no CVA tenderness  Lungs:     Distress - no , Wheeze yes, Barrell Chest - yes, Purse lip breathing - yes, Crackles - no   Chest Wall:    No tenderness or deformity.    Heart:    Regular rate and rhythm, S1 and S2 normal, no rub   or gallop, Murmur - no  Breast Exam:    NOT DONE  Abdomen:     Soft, non-tender, bowel sounds active all four quadrants,    no masses, no organomegaly. Visceral obesity - yes  Genitalia:   NOT DONE  Rectal:   NOT DONE  Extremities:   Extremities - normal, Has Cane - no, Clubbing - no, Edema - no  Pulses:   2+ and symmetric all extremities  Skin:   Stigmata of Connective Tissue Disease - no  Lymph  nodes:   Cervical, supraclavicular, and axillary nodes normal  Psychiatric:  Neurologic:   Pleasant - yes, Anxious - no, Flat affect - no  CAm-ICU - neg, Alert and Oriented x 3 - yes, Moves all 4s - yes, Speech - normal, Cognition - intact           Assessment:       ICD-10-CM   1. Stage 3 severe COPD by GOLD classification (HCC) J44.9   2. COPD exacerbation (HCC) J44.1   3. Cigarette smoker F17.210   4. Cancer screening Z12.9        Plan:     Patient Instructions     ICD-10-CM   1. Stage 3 severe COPD by GOLD classification (HCC) J44.9   2. COPD exacerbation (HCC) J44.1   3. Cigarette smoker F17.210   4. Cancer screening Z12.9    Stage 3 severe COPD by GOLD classification (HCC) - given high symptom burden and recurrent flare ups change spiriva and symbicort to TRELEGY once daily - continue ventolin as needed - glad uptodate with flu shot - quit smoking - stop fish oil - can cause night acid reflux than can impact lungs - talk to Andris Baumann, MD and stop atenolol because it can affect your lungs  COPD exacerbation (HCC) - Please take prednisone 40 mg x1 day, then 30 mg x1 day, then 20 mg x1 day, then 10 mg x1 day, and then 5 mg x1 day and stop   Cigarette smoker - quit smoking asap  Lung cancer screening -  Keep CT Chest for march 2020  Followup March 2020 after CT chest     SIGNATURE    Dr. Kalman Shan, M.D., F.C.C.P,  Pulmonary and Critical Care Medicine Staff Physician, [facility] Center Director - Interstitial Lung Disease  Program  Pulmonary Fibrosis [facility] Network at [facility] [place], [name], [address]  Pager: [phone], If no answer or between  15:00h - 7:00h: call [phone] Telephone: [phone]  10:26 AM 09/24/2018

## 2018-09-24 NOTE — Progress Notes (Signed)
PFT done today. 

## 2018-09-24 NOTE — Addendum Note (Signed)
Addended by: Ebony CargoKING, Viney Acocella M on: 09/24/2018 09:32 AM   Modules accepted: Orders

## 2018-09-26 ENCOUNTER — Telehealth: Payer: Self-pay | Admitting: Internal Medicine

## 2018-09-26 DIAGNOSIS — J449 Chronic obstructive pulmonary disease, unspecified: Secondary | ICD-10-CM | POA: Diagnosis not present

## 2018-09-26 NOTE — Telephone Encounter (Signed)
I ordered labs back in September that he did not do. These have likely expired. Did not order additional testing.

## 2018-09-26 NOTE — Telephone Encounter (Signed)
MR pleas advise once available.. Thank you.

## 2018-09-26 NOTE — Telephone Encounter (Signed)
Called and spoke with patient, he is requesting results from sputum culture. Advised patient once we received the results we would contact him.    BW please advise once available. Thank you.

## 2018-09-26 NOTE — Telephone Encounter (Signed)
I do not know what results he is referring to. He saw MR last. Sputum culture hasn't results if that is what he is referring too.

## 2018-09-28 NOTE — Telephone Encounter (Signed)
Called and spoke with patient, advised that we are still waiting on results and we will contact him as soon as they are available. Apologized for any inconvenience. Patient verbalized understanding.    MR please advise, thank you.

## 2018-09-28 NOTE — Telephone Encounter (Signed)
Pt is calling back [phone]

## 2018-10-01 NOTE — Telephone Encounter (Signed)
No growth so far in sputum culture from 09/24/18.  Probsably will end up as no growth      SIGNATURE    Dr. Kalman ShanMurali Spyridon Hornstein, M.D., F.C.C.P,  Pulmonary and Critical Care Medicine Staff Physician, [facility] Center Director - Interstitial Lung Disease  Program  Pulmonary Fibrosis [facility] [place], [place], [id number]  Pager: [phone], If no answer or between  15:00h - 7:00h: call [phone] Telephone: [phone]  10:01 AM 10/01/2018

## 2018-10-01 NOTE — Telephone Encounter (Signed)
Advised pt of results. Pt understood and nothing further is needed.   

## 2018-10-15 ENCOUNTER — Telehealth: Payer: Self-pay | Admitting: Internal Medicine

## 2018-10-15 NOTE — Telephone Encounter (Signed)
Called patient unable to reach left message to give us a call back.

## 2018-10-16 NOTE — Telephone Encounter (Signed)
Patient returned phone call ; pt contact # [phone]

## 2018-10-16 NOTE — Telephone Encounter (Signed)
Result Notes for Fungus Culture & Smear  Notes recorded by Earvin Hansenonnolly, Lisa M, RN on 10/15/2018 at 4:23 PM EST Sending letter ------ Notes recorded by Lorel MonacoLemons, Lindsay C, CMA on 10/15/2018 at 2:34 PM EST Attempted to contact pt. I did not receive an answer. I have left a message for pt to return our call. ------ Notes recorded by Earvin Hansenonnolly, Lisa M, RN on 10/03/2018 at 2:43 PM EST LMOM ------ Notes recorded by Maurene CapesPotts, Cherina M, CMA on 09/28/2018 at 9:24 AM EST Left message for patient to call back for results. ------ Notes recorded by Glenford BayleyWalsh, Elizabeth W, NP on 09/28/2018 at 8:47 AM EST Let patient know sputum sample showed no fungal elements on smear. Culture is still pending, can take 72 hours+   Per the 11.27.19 phone note, patient was already aware of these results as of 12.2.19 Called spoke with patient and apologized for the miscommunication Patient is aware we will contact him again once the final results are back on the sputum culture Patient voiced his understanding Nothing further needed; will sign off

## 2018-10-23 LAB — EXTRA

## 2018-10-23 LAB — FUNGUS CULTURE W SMEAR
MICRO NUMBER: 91418488
SMEAR: NONE SEEN
SPECIMEN QUALITY:: ADEQUATE

## 2018-10-26 DIAGNOSIS — J449 Chronic obstructive pulmonary disease, unspecified: Secondary | ICD-10-CM | POA: Diagnosis not present

## 2018-11-01 ENCOUNTER — Telehealth: Payer: Self-pay | Admitting: Internal Medicine

## 2018-11-01 NOTE — Telephone Encounter (Signed)
Called and spoke wit Patient. Sputum results given. Understanding stated. Nothing further at this time.  Per EW-  Notes recorded by [name], NP on 09/28/2018 at 8:47 AM EST Let patient know sputum sample showed no fungal elements on smear. Culture is still pending, can take 72 hours+

## 2018-11-26 DIAGNOSIS — J449 Chronic obstructive pulmonary disease, unspecified: Secondary | ICD-10-CM | POA: Diagnosis not present

## 2018-12-14 ENCOUNTER — Ambulatory Visit (INDEPENDENT_AMBULATORY_CARE_PROVIDER_SITE_OTHER): Payer: Medicare HMO | Admitting: Primary Care

## 2018-12-14 ENCOUNTER — Encounter: Payer: Self-pay | Admitting: Primary Care

## 2018-12-14 DIAGNOSIS — J441 Chronic obstructive pulmonary disease with (acute) exacerbation: Secondary | ICD-10-CM

## 2018-12-14 DIAGNOSIS — F319 Bipolar disorder, unspecified: Secondary | ICD-10-CM | POA: Diagnosis not present

## 2018-12-14 MED ORDER — BENZONATATE 200 MG PO CAPS: 200.0000 mg | ORAL_CAPSULE | Freq: Three times a day (TID) | ORAL | 1 refills | Status: DC | PRN

## 2018-12-14 MED ORDER — PREDNISONE 10 MG PO TABS: ORAL_TABLET | ORAL | 0 refills | Status: DC

## 2018-12-14 MED ORDER — HYDROCOD POLST-CPM POLST ER 10-8 MG/5ML PO SUER: 5.0000 mL | Freq: Every evening | ORAL | 0 refills | Status: DC | PRN

## 2018-12-14 MED ORDER — DOXYCYCLINE HYCLATE 100 MG PO TABS: 100.0000 mg | ORAL_TABLET | Freq: Two times a day (BID) | ORAL | 0 refills | Status: DC

## 2018-12-14 NOTE — Progress Notes (Signed)
 @Patient  ID: Glen Lopez, male    DOB: 12/02/1955, 63 y.o.   MRN: [id number]  Chief Complaint  Patient presents with  . Acute Visit    MR patient; Increased SOB and chest congestion x 1 week with productive cough-clear; denies any other body aches.     Referring provider: Deatra JamesSun, Vyvyan, MD  HPI: 63 year old male, current everyday smoker. PMH bipolar disorder, HTN, COPD, chronic resp failure with hypoxia, tobacco use, post nasal drip. Low dose chest CT showed RADS 2, repeat in 12 months due Feb/March 2020. Patient of Dr. Marchelle Gearingamaswamy.  Maintained on Symbicort 160 and Spiriva. PRN ventolin. Last COPD exacerbation was in September 2019.   12/14/2018 Patient presents today for acute visit with complaints of cough and increased shortness of breath x 1 week. Associated chills. Cough is productive with thick clear mucus. Cough is worse when lying down at night. He has been taking Mucinex and Nyquil with no improvement. Using rescue inhaler more frequently. Denies fever.   Allergies  Allergen Reactions  . Codeine Nausea And Vomiting    Immunization History  Administered Date(s) Administered  . Influenza Split 09/23/2017  . Influenza, High Dose Seasonal PF 07/19/2018  . Influenza,inj,Quad PF,6+ Mos 09/09/2015, 07/14/2016  . Influenza-Unspecified 07/31/2014  . Pneumococcal Conjugate-13 07/19/2018  . Pneumococcal Polysaccharide-23 10/31/2013    Past Medical History:  Diagnosis Date  . Anxiety   . Arthritis   . Asthma   . Bipolar disorder (HCC)   . COPD (chronic obstructive pulmonary disease) (HCC)   . Depression   . GERD (gastroesophageal reflux disease)   . Headache(784.0)   . Hiatal hernia   . Hypertension     Tobacco History: Social History   Tobacco Use  Smoking Status Current Every Day Smoker  . Packs/day: 0.50  . Years: 48.00  . Pack years: 24.00  . Types: Cigarettes  Smokeless Tobacco Never Used  Tobacco Comment   2-3 cigs per day/  ep   Ready to quit: Not  Answered Counseling given: Not Answered Comment: 2-3 cigs per day/  ep   Outpatient Medications Prior to Visit  Medication Sig Dispense Refill  . albuterol (PROVENTIL HFA;VENTOLIN HFA) 108 (90 Base) MCG/ACT inhaler Inhale 2 puffs into the lungs every 4 (four) hours as needed. For shortness of breath 2 Inhaler 5  . albuterol (PROVENTIL) (2.5 MG/3ML) 0.083% nebulizer solution Take 3 mLs (2.5 mg total) by nebulization every 6 (six) hours as needed. For shortness of breath 75 mL 5  . aspirin EC 81 MG tablet Take 1 tablet (81 mg total) by mouth daily.    Marland Kitchen. atenolol-chlorthalidone (TENORETIC) 100-25 MG tablet Take 1 tablet by mouth daily.    . fluticasone (FLONASE) 50 MCG/ACT nasal spray Place 2 sprays into both nostrils daily. 16 g 5  . Fluticasone-Umeclidin-Vilant (TRELEGY ELLIPTA) 100-62.5-25 MCG/INH AEPB Inhale 1 puff into the lungs daily. 60 each 3  . lithium carbonate 300 MG capsule Take 300 mg by mouth 3 (three) times daily with meals.    . Multiple Vitamin (MULTIVITAMIN WITH MINERALS) TABS tablet Take 1 tablet by mouth daily.    Marland Kitchen. nystatin (MYCOSTATIN) 100000 UNIT/ML suspension Take 5 mLs (500,000 Units total) by mouth 4 (four) times daily. Swish and swallow 60 mL 0  . Omega-3 Fatty Acids (FISH OIL) 500 MG CAPS Take 500 mg by mouth daily.    Marland Kitchen. omeprazole (PRILOSEC) 20 MG capsule Take 20 mg by mouth daily as needed (for acid reflux/indigestion).    .Marland Kitchen  Respiratory Therapy Supplies (FLUTTER) DEVI Use 3 times a day. 1 each 0  . sodium chloride HYPERTONIC 3 % nebulizer solution Use twice a day as needed for congestion/cough 750 mL 3  . budesonide-formoterol (SYMBICORT) 160-4.5 MCG/ACT inhaler Inhale 2 puffs into the lungs 2 (two) times daily. (Patient not taking: Reported on 12/14/2018) 1 Inhaler 6  . Fluticasone-Umeclidin-Vilant (TRELEGY ELLIPTA) 100-62.5-25 MCG/INH AEPB Inhale 1 puff into the lungs daily. (Patient not taking: Reported on 12/14/2018) 60 each 0  . predniSONE (DELTASONE) 10 MG tablet  Take 40mg  x1 day, then 30mg  x1 day, then 20mg  x1 day, then 10mg  x1 day, and then 5mg  x1 day then stop (Patient not taking: Reported on 12/14/2018) 11 tablet 0  . Tiotropium Bromide Monohydrate (SPIRIVA RESPIMAT) 2.5 MCG/ACT AERS INHALE 2 PUFFS INTO THE LUNGS EACH MORNING (Patient not taking: Reported on 12/14/2018) 1 Inhaler 6   Facility-Administered Medications Prior to Visit  Medication Dose Route Frequency Provider Last Rate Last Dose  . levalbuterol (XOPENEX) nebulizer solution 0.63 mg  0.63 mg Nebulization Q6H [name], NP        Review of Systems  Review of Systems  Constitutional: Positive for chills. Negative for fever.  HENT: Negative.   Respiratory: Positive for cough, shortness of breath and wheezing.   Cardiovascular: Negative.     Physical Exam  BP 138/80 (BP Location: Left Arm, Cuff Size: Normal)   Pulse 100   Ht 5\' 6"  (1.676 m)   Wt 191 lb 9.6 oz (86.9 kg)   SpO2 93%   BMI 30.93 kg/m  Physical Exam Constitutional:      Appearance: Normal appearance. He is not ill-appearing.  HENT:     Right Ear: Tympanic membrane normal.     Left Ear: Tympanic membrane normal.     Mouth/Throat:     Mouth: Mucous membranes are moist.     Pharynx: Oropharynx is clear.  Neck:     Musculoskeletal: Normal range of motion and neck supple.  Cardiovascular:     Rate and Rhythm: Normal rate and regular rhythm.  Pulmonary:     Effort: Pulmonary effort is normal.     Breath sounds: Wheezing present.  Musculoskeletal: Normal range of motion.  Skin:    General: Skin is warm and dry.  Neurological:     General: No focal deficit present.     Mental Status: He is alert and oriented to person, place, and time. Mental status is at baseline.  Psychiatric:        Mood and Affect: Mood normal.        Behavior: Behavior normal.        Thought Content: Thought content normal.        Judgment: Judgment normal.      Lab Results:  CBC    Component Value Date/Time   WBC 16.3  (H) 12/09/2015 0557   RBC 4.38 12/09/2015 0557   HGB 14.0 12/09/2015 0557   HCT 43.6 12/09/2015 0557   PLT 111 (L) 12/09/2015 0557   MCV 99.5 12/09/2015 0557   MCH 32.0 12/09/2015 0557   MCHC 32.1 12/09/2015 0557   RDW 15.2 12/09/2015 0557   LYMPHSABS 0.7 12/08/2015 0551   MONOABS 0.3 12/08/2015 0551   EOSABS 0.0 12/08/2015 0551   BASOSABS 0.0 12/08/2015 0551    BMET    Component Value Date/Time   NA 139 12/09/2015 0557   K 4.7 12/09/2015 0557   CL 109 12/09/2015 0557   CO2 24 12/09/2015 0557  GLUCOSE 176 (H) 12/09/2015 0557   BUN 17 12/09/2015 0557   CREATININE 0.94 12/09/2015 0557   CALCIUM 8.4 (L) 12/09/2015 0557   GFRNONAA >60 12/09/2015 0557   GFRAA >60 12/09/2015 0557    BNP No results found for: BNP  ProBNP    Component Value Date/Time   PROBNP 125.2 (H) 10/31/2013 1645    Imaging: No results found.   Assessment & Plan:   COPD exacerbation (HCC) Acute exacerbation COPD symptoms  - RX Doxycyline 1 tab twice daily x 1 week and Prednisone taper as prescribed - Continue mucinex twice daily - Delsym cough syrup during the day, prescription cough medication night only (pateint has safely taken before) - Continue Trelegy and as needed albuterol nebulizer every 4-6 hours for sob/wheezing - Work on quitting smoking!   [name], NP 12/14/2018

## 2018-12-14 NOTE — Assessment & Plan Note (Signed)
Acute exacerbation COPD symptoms  - RX Doxycyline 1 tab twice daily x 1 week and Prednisone taper as prescribed - Continue mucinex twice daily - Delsym cough syrup during the day, prescription cough medication night only (pateint has safely taken before) - Continue Trelegy and as needed albuterol nebulizer every 4-6 hours for sob/wheezing - Work on quitting smoking!

## 2018-12-14 NOTE — Patient Instructions (Addendum)
RX: Doxycyline 1 tab twice daily x 1 week Prednisone taper as prescribed  Recommendations: Continue mucinex twice daily Delsym cough syrup during the day Continue Trelegy and as needed albuterol nebulizer every 4-6 hours for sob/wheezing Work on quitting smoking!!!!   Follow-up: Return if symptoms worsen or do not improve  If prescriptions too expensive, check [facility] app on phone    Chronic Obstructive Pulmonary Disease Exacerbation Chronic obstructive pulmonary disease (COPD) is a long-term (chronic) lung problem. In COPD, the flow of air from the lungs is limited. COPD exacerbations are times that breathing gets worse and you need more than your normal treatment. Without treatment, they can be life threatening. If they happen often, your lungs can become more damaged. If your COPD gets worse, your doctor may treat you with:  Medicines.  Oxygen.  Different ways to clear your airway, such as using a mask. Follow these instructions at home: Medicines  Take over-the-counter and prescription medicines only as told by your doctor.  If you take an antibiotic or steroid medicine, do not stop taking the medicine even if you start to feel better.  Keep up with shots (vaccinations) as told by your doctor. Be sure to get a yearly (annual) flu shot. Lifestyle  Do not smoke. If you need help quitting, ask your doctor.  Eat healthy foods.  Exercise regularly.  Get plenty of sleep.  Avoid tobacco smoke and other things that can bother your lungs.  Wash your hands often with soap and water. This will help keep you from getting an infection. If you cannot use soap and water, use hand sanitizer.  During flu season, avoid areas that are crowded with people. General instructions  Drink enough fluid to keep your pee (urine) clear or pale yellow. Do not do this if your doctor has told you not to.  Use a cool mist machine (vaporizer).  If you use oxygen or a machine that turns  medicine into a mist (nebulizer), continue to use it as told.  Follow all instructions for rehabilitation. These are steps you can take to make your body work better.  Keep all follow-up visits as told by your doctor. This is important. Contact a doctor if:  Your COPD symptoms get worse than normal. Get help right away if:  You are short of breath and it gets worse.  You have trouble talking.  You have chest pain.  You cough up blood.  You have a fever.  You keep throwing up (vomiting).  You feel weak or you pass out (faint).  You feel confused.  You are not able to sleep because of your symptoms.  You are not able to do daily activities. Summary  COPD exacerbations are times that breathing gets worse and you need more treatment than normal.  COPD exacerbations can be very serious and may cause your lungs to become more damaged.  Do not smoke. If you need help quitting, ask your doctor.  Stay up-to-date on your shots. Get a flu shot every year. This information is not intended to replace advice given to you by your health care provider. Make sure you discuss any questions you have with your health care provider. Document Released: 10/06/2011 Document Revised: 11/21/2016 Document Reviewed: 11/21/2016 Elsevier Interactive Patient Education  2019 [employer].    Steps to Quit Smoking  Smoking tobacco can be bad for your health. It can also affect almost every organ in your body. Smoking puts you and people around you at risk for  many serious long-lasting (chronic) diseases. Quitting smoking is hard, but it is one of the best things that you can do for your health. It is never too late to quit. What are the benefits of quitting smoking? When you quit smoking, you lower your risk for getting serious diseases and conditions. They can include:  Lung cancer or lung disease.  Heart disease.  Stroke.  Heart attack.  Not being able to have children  (infertility).  Weak bones (osteoporosis) and broken bones (fractures). If you have coughing, wheezing, and shortness of breath, those symptoms may get better when you quit. You may also get sick less often. If you are pregnant, quitting smoking can help to lower your chances of having a baby of low birth weight. What can I do to help me quit smoking? Talk with your doctor about what can help you quit smoking. Some things you can do (strategies) include:  Quitting smoking totally, instead of slowly cutting back how much you smoke over a period of time.  Going to in-person counseling. You are more likely to quit if you go to many counseling sessions.  Using resources and support systems, such as: ? Agricultural engineer with a Veterinary surgeon. ? Phone quitlines. ? Automotive engineer. ? Support groups or group counseling. ? Text messaging programs. ? Mobile phone apps or applications.  Taking medicines. Some of these medicines may have nicotine in them. If you are pregnant or breastfeeding, do not take any medicines to quit smoking unless your doctor says it is okay. Talk with your doctor about counseling or other things that can help you. Talk with your doctor about using more than one strategy at the same time, such as taking medicines while you are also going to in-person counseling. This can help make quitting easier. What things can I do to make it easier to quit? Quitting smoking might feel very hard at first, but there is a lot that you can do to make it easier. Take these steps:  Talk to your family and friends. Ask them to support and encourage you.  Call phone quitlines, reach out to support groups, or work with a Veterinary surgeon.  Ask people who smoke to not smoke around you.  Avoid places that make you want (trigger) to smoke, such as: ? Bars. ? Parties. ? Smoke-break areas at work.  Spend time with people who do not smoke.  Lower the stress in your life. Stress can make you want  to smoke. Try these things to help your stress: ? Getting regular exercise. ? Deep-breathing exercises. ? Yoga. ? Meditating. ? Doing a body scan. To do this, close your eyes, focus on one area of your body at a time from head to toe, and notice which parts of your body are tense. Try to relax the muscles in those areas.  Download or buy apps on your mobile phone or tablet that can help you stick to your quit plan. There are many free apps, such as QuitGuide from the [employer] developer for Disease Control and Prevention). You can find more support from smokefree.gov and other websites. This information is not intended to replace advice given to you by your health care provider. Make sure you discuss any questions you have with your health care provider. Document Released: 08/13/2009 Document Revised: 06/14/2016 Document Reviewed: 03/03/2015 Elsevier Interactive Patient Education  2019 [employer].

## 2018-12-17 ENCOUNTER — Other Ambulatory Visit: Payer: Self-pay | Admitting: Primary Care

## 2018-12-17 ENCOUNTER — Telehealth: Payer: Self-pay | Admitting: Primary Care

## 2018-12-17 NOTE — Telephone Encounter (Signed)
Per Ames Dura, NP, ok to resend Doxycycline prescription. Called and spoke with Patient.  He requested prescription to be sent to [facility]. Prescription sent.  Nothing further at this time.

## 2018-12-17 NOTE — Telephone Encounter (Signed)
That's fine, please re-send

## 2018-12-17 NOTE — Telephone Encounter (Signed)
Pt states he dropped his doxycycline tablets down the sink on Saturday after taking 2 tabs of this medicine (was opening bottle for 3rd tab).  Pt is requesting a refill on this medicine. Pharmacy: [facility].  Beth please advise if ok to resend rx.

## 2018-12-17 NOTE — Telephone Encounter (Signed)
Patient was seen 12/14/18 by NP Clent Ridges. Per patient: He dropped his doxycycline tablets down the sink on Saturday after taking 2 tabs of this medicine (was opening bottle for 3rd tab).  Pt is requesting a refill on this medicine.  EW please advise. Thanks.

## 2018-12-27 DIAGNOSIS — J449 Chronic obstructive pulmonary disease, unspecified: Secondary | ICD-10-CM | POA: Diagnosis not present

## 2019-01-07 ENCOUNTER — Inpatient Hospital Stay: Admission: RE | Admit: 2019-01-07 | Payer: Medicare HMO | Source: Ambulatory Visit

## 2019-01-14 ENCOUNTER — Telehealth: Payer: Self-pay | Admitting: Internal Medicine

## 2019-01-14 ENCOUNTER — Other Ambulatory Visit: Payer: Self-pay | Admitting: Primary Care

## 2019-01-14 NOTE — Telephone Encounter (Signed)
That is fine, please send in RX for tessalon 100 or 200mg  TID prn cough. Advise mucinex, antihistamine and flonase nasal spray.

## 2019-01-14 NOTE — Telephone Encounter (Signed)
Patient requested Tussionex refill, not Tessalon.  Please advise

## 2019-01-14 NOTE — Telephone Encounter (Signed)
At this time I will not prescribe prescription cough medication over the phone. We do not recommend taking this medication often. Advise Tessalon perles for cough and mucinex. He should start taking antihistamine if he isn't already and Flonase nasal spray. He can discuss cough medication with Dr. Marchelle Gearing during their visit on 3/30.

## 2019-01-14 NOTE — Telephone Encounter (Signed)
Called and spoke with Patient.  Ames Dura, NP recommendations given.  Understanding stated.  Patient stated that he still had tessalon perles,and has a refill left.  Nothing further at this time.

## 2019-01-14 NOTE — Telephone Encounter (Signed)
Called and spoke with Patient. Patient is a Dr Marchelle Gearing Patient, last seen by Ames Dura, NP, 12/14/18, for COPD.  Patient stated that he is having a cough, with white phlegm.  Patient stated that he felt like his cough got worse with the change of the weather.  Patient stated that his cough is worse at night, and he is unable to get rest. Patient denies fever, chills, or wheezing. Patient requested a refill of his tussionex to be sent to [facility].   Last Tussionex refill was 12/14/18, 20ml, by EW.  Message routed to Buelah Manis, NP

## 2019-01-25 DIAGNOSIS — J449 Chronic obstructive pulmonary disease, unspecified: Secondary | ICD-10-CM | POA: Diagnosis not present

## 2019-01-28 ENCOUNTER — Inpatient Hospital Stay: Admission: RE | Admit: 2019-01-28 | Payer: Medicare HMO | Source: Ambulatory Visit

## 2019-02-05 ENCOUNTER — Other Ambulatory Visit: Payer: Self-pay | Admitting: Primary Care

## 2019-02-05 ENCOUNTER — Telehealth: Payer: Self-pay | Admitting: Internal Medicine

## 2019-02-05 ENCOUNTER — Other Ambulatory Visit: Payer: Self-pay | Admitting: Internal Medicine

## 2019-02-05 MED ORDER — ALBUTEROL SULFATE HFA 108 (90 BASE) MCG/ACT IN AERS: INHALATION_SPRAY | RESPIRATORY_TRACT | 5 refills | Status: DC

## 2019-02-05 MED ORDER — FLUTICASONE-UMECLIDIN-VILANT 100-62.5-25 MCG/INH IN AEPB: 1.0000 | INHALATION_SPRAY | Freq: Every day | RESPIRATORY_TRACT | 3 refills | Status: DC

## 2019-02-05 MED FILL — TRELEGY ELLIPTA 100-62.5-25: 100-62.5-25 | 30 days supply | Qty: 60 | Fill #0

## 2019-02-05 MED FILL — VENTOLIN HFA 90 MCG INHALER: 108 (90 BAS | 16 days supply | Qty: 18 | Fill #0

## 2019-02-05 NOTE — Telephone Encounter (Signed)
Refill sent nothing further is needed at this time.

## 2019-02-25 DIAGNOSIS — J449 Chronic obstructive pulmonary disease, unspecified: Secondary | ICD-10-CM | POA: Diagnosis not present

## 2019-02-28 MED FILL — VENTOLIN HFA 90 MCG INHALER: 108 (90 BAS | 16 days supply | Qty: 18 | Fill #1

## 2019-02-28 MED FILL — TRELEGY ELLIPTA 100-62.5-25: 100-62.5-25 | 30 days supply | Qty: 60 | Fill #1

## 2019-03-19 ENCOUNTER — Other Ambulatory Visit: Payer: Self-pay | Admitting: Acute Care

## 2019-03-19 DIAGNOSIS — Z87891 Personal history of nicotine dependence: Secondary | ICD-10-CM

## 2019-03-19 DIAGNOSIS — Z122 Encounter for screening for malignant neoplasm of respiratory organs: Secondary | ICD-10-CM

## 2019-03-19 DIAGNOSIS — F17210 Nicotine dependence, cigarettes, uncomplicated: Secondary | ICD-10-CM

## 2019-04-03 ENCOUNTER — Telehealth: Payer: Self-pay | Admitting: *Deleted

## 2019-04-03 MED FILL — VENTOLIN HFA 90 MCG INHALER: 108 (90 BAS | 33 days supply | Qty: 36 | Fill #2

## 2019-04-03 MED FILL — TRELEGY ELLIPTA 100-62.5-25: 100-62.5-25 | 30 days supply | Qty: 60 | Fill #2

## 2019-04-03 NOTE — Telephone Encounter (Signed)
Script Screening patients for COVID-19 and reviewing new operational procedures  Greeting - The reason I am calling is to share with you some new changes to our processes that are designed to help us keep everyone safe. Is now a good time to speak with you? Patient says "no' - ask them when you can call back and let them know it's important to do this prior to their appointment.  Patient says "yes" - Great, Jerry the first thing I need to do is ask you some screening Questions.  1. To the best of your knowledge, have you been in close contact with any one with a confirmed diagnosis of COVID 19? o No - proceed to next question  2. Have you had any one or more of the following: fever, chills, cough, shortness of breath or any flu-like symptoms? o No - proceed to next question  3. Have you been diagnosed with or have a previous diagnosis of COVID 19? o No - proceed to next question  4. I am going to go over a few other symptoms with you. Please let me know if you are experiencing any of the following: . Ear, nose or throat discomfort . A sore throat . Headache . Muscle pain . Diarrhea . Loss of taste or smell o No - proceed to next question  Thank you for answering these questions. Please know we will ask you these questions or similar questions when you arrive for your appointment and again it's how we are keeping everyone safe. Also, to keep you safe, please use the provided hand sanitizer when you enter the building. Bernabe, we are asking everyone in the building to wear a mask because they help us prevent the spread of germs. Do you have a mask of your own, if not, we are happy to provide one for you. The last thing I want to go over with you is the no visitor guidelines. This means no one can attend the appointment with you unless you need physical assistance. I understand this may be different from your past appointments and I know this may be difficult but please know if  someone is driving you we are happy to call them for you once your appointment is over.  [INSERT SITE SPECIFIC CHECK IN PROCEDURES]  Reichen I've given you a lot of information, what questions do you have about what I've talked about today or your appointment tomorrow?  

## 2019-04-05 ENCOUNTER — Ambulatory Visit (INDEPENDENT_AMBULATORY_CARE_PROVIDER_SITE_OTHER)
Admission: RE | Admit: 2019-04-05 | Discharge: 2019-04-05 | Disposition: A | Discharge status: Home / Self Care | Payer: Medicare HMO | Source: Ambulatory Visit | Attending: Acute Care | Admitting: Acute Care

## 2019-04-05 ENCOUNTER — Other Ambulatory Visit: Payer: Self-pay

## 2019-04-05 ENCOUNTER — Other Ambulatory Visit: Payer: Self-pay | Admitting: Acute Care

## 2019-04-05 DIAGNOSIS — Z122 Encounter for screening for malignant neoplasm of respiratory organs: Secondary | ICD-10-CM

## 2019-04-05 DIAGNOSIS — F17210 Nicotine dependence, cigarettes, uncomplicated: Secondary | ICD-10-CM

## 2019-04-05 DIAGNOSIS — Z87891 Personal history of nicotine dependence: Secondary | ICD-10-CM

## 2019-04-09 MED FILL — ATORVASTATIN 80 MG TABLET: 80 | 90 days supply | Qty: 90 | Fill #0

## 2019-04-09 MED FILL — LISINOPRIL 5 MG TABLET: 5 | 90 days supply | Qty: 90 | Fill #0

## 2019-04-09 MED FILL — PRASUGREL HCL 10 MG TABS: 10 | 30 days supply | Qty: 30 | Fill #0

## 2019-06-05 ENCOUNTER — Other Ambulatory Visit: Payer: Self-pay | Admitting: Internal Medicine

## 2019-08-02 ENCOUNTER — Other Ambulatory Visit: Payer: Self-pay | Admitting: Internal Medicine

## 2019-08-05 ENCOUNTER — Telehealth: Payer: Self-pay | Admitting: Internal Medicine

## 2019-08-05 MED ORDER — TRELEGY ELLIPTA 100-62.5-25 MCG/INH IN AEPB: 1.0000 | INHALATION_SPRAY | Freq: Every day | RESPIRATORY_TRACT | 5 refills | Status: AC

## 2019-08-05 NOTE — Telephone Encounter (Signed)
Call returned to patient, confirmed DOB, medication, and pharmacy. Refill sent. F/U appt made. Nothing further needed at this time.  

## 2019-08-19 ENCOUNTER — Other Ambulatory Visit: Payer: Self-pay

## 2019-08-19 ENCOUNTER — Ambulatory Visit (INDEPENDENT_AMBULATORY_CARE_PROVIDER_SITE_OTHER): Payer: Medicare HMO | Admitting: Internal Medicine

## 2019-08-19 ENCOUNTER — Encounter: Payer: Self-pay | Admitting: Internal Medicine

## 2019-08-19 VITALS — BP 140/80 | HR 87 | Temp 97.5°F | Ht 66.0 in | Wt 182.6 lb

## 2019-08-19 DIAGNOSIS — Z129 Encounter for screening for malignant neoplasm, site unspecified: Secondary | ICD-10-CM | POA: Diagnosis not present

## 2019-08-19 DIAGNOSIS — J449 Chronic obstructive pulmonary disease, unspecified: Secondary | ICD-10-CM | POA: Diagnosis not present

## 2019-08-19 DIAGNOSIS — F17210 Nicotine dependence, cigarettes, uncomplicated: Secondary | ICD-10-CM

## 2019-08-19 NOTE — Patient Instructions (Addendum)
ICD-10-CM   1. Stage 3 severe COPD by GOLD classification ([place]) J44.9   2. COPD exacerbation ([place]) J44.1   3. Cigarette smoker F17.210   4. Cancer screening Z12.9    Stage 3 severe COPD by GOLD classification ([place]) - continueTRELEGY once daily - continue ventolin as needed - use 3% saline neb as needed - glad uptodate with flu shot - quit smoking - stop fish oil - can cause night acid reflux than can impact lungs - talk to Su Hilt, MD and stop atenolol because it can affect your lungs  Cigarette smoker - quit smoking asap  Lung cancer screening  -no recurrence June 2020 -  Do followup ldct in July 2021   Followup 6 months -do CAT score and simple walk test in offie at followu

## 2019-08-19 NOTE — Progress Notes (Signed)
OV 07/14/2016  Chief Complaint  Patient presents with  . Follow-up    9 month COPD follow up - reports breathing is near baseline with the change of seasons.    Fu copd - mod/severe with CT features of MAI early 2017 Fu smoking  Follows with ? Wife. Wants flu shot. Wants med refills. Cotninues to smoke; unable to quit. In feb 2017 says admitted for ae-copd and this scared him. Says now for months increased wheeze and yellow sputum and also cough with sinus drainage. He is off opioids. Does not want to go back to opioids due to addiction but cough is worse.    OV 11/23/2017  Chief Complaint  Patient presents with  . Follow-up    Last seen 07/14/16. Pt states he has had a lot of congestion in head and chest, coughing with green mucus, and a lot of chest discomfort that has been happenng x5 days.  63 year old male with COPD not otherwise specified.  Features of MAI in 2017.  Active smoker  This is a routine visit.  Is on Spiriva and Symbicort which he says he is compliant with. He continues to smoke.  Has difficulty quitting.  He tells me that for the last 5 or 6 days he has had increased cough congestion wheezing yellow green sputum and severe shortness of breath.  There is no fever or chills.  No flu exposure.  COPD CAT score is documented below and significantly high.   06/25/2018 Patient presents today for acute visit with complaints of shortness of breath x1 week with rest and activity. Normally he is functional, maintained on symbicort 160 and Spiriva. Needs new neb machine, has had it for over 5 years. Reports that it quit working and there is a burning smell coming from it. Wants different DME company. BP is elevated today. He had a cigarette before today's visit and he did not take blood pressure medication. Still smoking, 3-4 cig a day (previous 2ppd). Treated for COPD exacerbation with doxycycline and prednisone taper. CXR showed COPD-reactive airway disease. Probable  subsegmental atelectasis or early pneumonia in the lingula. Followup PA and lateral chest X-ray is recommended in 3-4 weeks   07/09/2018 Patient returns for 1-2 week fu COPD exacerbation. Fatigue has improved. Still has congested cough, feels it mostly in his lung bases. States that he gets short of breath with stairs and walking to the bathroom. Using rescue inhaler more than normal, approx 4-5 times a days. Taking Robitussin-DM. States that this is not his baseline. No fever but sweating. Denies cp. Mucinex added. Extended prednisone and started on levaquin for COPD exacerbation. CXR repeated and showed no active process.   07/19/2018 Patient presents today for 10 fu regarding persistent COPD exacerbation. Patient reports that he is feeling a little better but still fatigues easily with activity. Continues to have congested cough. Has trouble getting mucus up when coughing which exacerbates his breathing. Takes Symbicort 160 and Spiriva as directed. Using albuterol inhaler every 4 hours. Continues mucinex twice daily and using flutter valve. Still smoking but has cutt back to less than 1/2 pack per day. Denies fever or chills.    OV 09/24/2018  Subjective:  Patient ID: Glen Lopez, male , DOB: 11/26/1955 , age 63 y.o. , MRN: [id number] , ADDRESS: [address] [place] [id number]   09/24/2018 -   Chief Complaint  Patient presents with  . Follow-up    breathing same, little cough, SOB on exertion,  little chest tightness and wheezing     HPI Glen Lopez 63 y.o. -presents for follow-up of his advanced COPD.,  Smoking and follow-up of lung cancer screening  COPD advance: He had pulmonary function test today and it shows Gold stage III COPD with significant bronchodilator response.  He is on Spiriva and Symbicort.  Med review also shows that he is taking fish oil which might be causing acid reflux at night but he denies.  He is also on atenolol nonspecific beta-blocker.  He is  up-to-date with his flu shot.  He is willing to change to Trelegy inhaler  Smoking: He continues to smoke.  He says he smokes occasionally and only little.  However the entire room is smelling of tobacco.   Lung cancer screening: He had lung RADS-2 findings in February 2019.  His neck CT scan of the chest is set up for March 2020  On exam he was wheezing and then he admitted that he has slightly worsening symptoms than baseline of COPD with white and yellow sputum.  He thinks 5 days of prednisone would be okay although he is wary of taking longer prednisone because of weight gain and appetite change.  OV 08/19/2019  Subjective:  Patient ID: Glen Lopez, male , DOB: [birth year] , age 63 y.o. , MRN: [id number] , ADDRESS: [address] [name] [address]   08/19/2019 -   Chief Complaint  Patient presents with  . Follow-up    COPD. Using albuterol prn and Trelegy daily. Reports his breathing has been at his baseline. Has already had Flu shot.      HPI Glen Lopez 63 y.o. -  Advanced copd follouw: last seen nov 2019 and then by app early 2020. boith times with aecopod. Siknce then no aecopd. On trelegy. Uses night o2 and portable o2 prn. Wants lighter portable system but does not want to wait for it. Uptodate with flu shot. No other issues. Continues to smoke. LDCT June 2020 without cancer.    CAT COPD Symptom & Quality of Life Score (GSK trademark) 0 is no burden. 5 is highest burden 11/23/2017  09/24/2018   Never Cough -> Cough all the time 5 3  No phlegm in chest -> Chest is full of phlegm 5 4  No chest tightness -> Chest feels very tight 5 2  No dyspnea for 1 flight stairs/hill -> Very dyspneic for 1 flight of stairs 5 5  No limitations for ADL at home -> Very limited with ADL at home 2 4  Confident leaving home -> Not at all confident leaving home 5 2  Sleep soundly -> Do not sleep soundly because of lung condition 2 4  Lots of Energy -> No energy at all 5 5   TOTAL Score (max 40)  34 34    IMPRESSION: Lung-RADS 2, benign appearance or behavior. Continue annual screening with low-dose chest CT without contrast in 12 months.  Aortic Atherosclerosis (ICD10-I70.0) and Emphysema (ICD10-J43.9).   Electronically Signed   By: Charline Bills M.D.   On: 04/05/2019 13:20  ROS - per HPI     has a past medical history of Anxiety, Arthritis, Asthma, Bipolar disorder (HCC), COPD (chronic obstructive pulmonary disease) (HCC), Depression, GERD (gastroesophageal reflux disease), Headache(784.0), Hiatal hernia, and Hypertension.   reports that he has been smoking cigarettes. He has a 24.00 pack-year smoking history. He has never used smokeless tobacco.  Past Surgical History:  Procedure Laterality Date  . artheroscopic knee  surgery R Right 01/2013    Allergies  Allergen Reactions  . Codeine Nausea And Vomiting    Immunization History  Administered Date(s) Administered  . Influenza Split 09/23/2017  . Influenza, High Dose Seasonal PF 07/19/2018  . Influenza, Quadrivalent, Recombinant, Inj, Pf 07/29/2019  . Influenza,inj,Quad PF,6+ Mos 09/09/2015, 07/14/2016  . Influenza-Unspecified 07/31/2014  . Pneumococcal Conjugate-13 07/19/2018  . Pneumococcal Polysaccharide-23 10/31/2013, 07/19/2018    Family History  Problem Relation Age of Onset  . Heart disease Father        MI at age 40  . Asthma Sister      Current Outpatient Medications:  .  albuterol (PROVENTIL) (2.5 MG/3ML) 0.083% nebulizer solution, Take 3 mLs (2.5 mg total) by nebulization every 6 (six) hours as needed. For shortness of breath, Disp: 75 mL, Rfl: 5 .  albuterol (VENTOLIN HFA) 108 (90 Base) MCG/ACT inhaler, INHALE 2 PUFFS INTO THE LUNGS EVERY 4 (FOUR) HOURS AS NEEDED. FOR SHORTNESS OF BREATH, Disp: 36 g, Rfl: 5 .  aspirin EC 81 MG tablet, Take 1 tablet (81 mg total) by mouth daily., Disp: , Rfl:  .  atenolol-chlorthalidone (TENORETIC) 100-25 MG tablet, Take 1 tablet  by mouth daily., Disp: , Rfl:  .  benzonatate (TESSALON) 200 MG capsule, Take 1 capsule (200 mg total) by mouth 3 (three) times daily as needed for cough., Disp: 30 capsule, Rfl: 1 .  chlorpheniramine-HYDROcodone (TUSSIONEX PENNKINETIC ER) 10-8 MG/5ML SUER, Take 5 mLs by mouth at bedtime as needed for cough., Disp: 70 mL, Rfl: 0 .  fluticasone (FLONASE) 50 MCG/ACT nasal spray, Place 2 sprays into both nostrils daily., Disp: 16 g, Rfl: 5 .  Fluticasone-Umeclidin-Vilant (TRELEGY ELLIPTA) 100-62.5-25 MCG/INH AEPB, Inhale 1 puff into the lungs daily., Disp: 30 each, Rfl: 5 .  lithium carbonate 300 MG capsule, Take 300 mg by mouth 3 (three) times daily with meals., Disp: , Rfl:  .  Multiple Vitamin (MULTIVITAMIN WITH MINERALS) TABS tablet, Take 1 tablet by mouth daily., Disp: , Rfl:  .  nystatin (MYCOSTATIN) 100000 UNIT/ML suspension, Take 5 mLs (500,000 Units total) by mouth 4 (four) times daily. Swish and swallow, Disp: 60 mL, Rfl: 0 .  Omega-3 Fatty Acids (FISH OIL) 500 MG CAPS, Take 500 mg by mouth daily., Disp: , Rfl:  .  omeprazole (PRILOSEC) 20 MG capsule, Take 20 mg by mouth daily as needed (for acid reflux/indigestion)., Disp: , Rfl:  .  Respiratory Therapy Supplies (FLUTTER) DEVI, Use 3 times a day., Disp: 1 each, Rfl: 0 .  sodium chloride HYPERTONIC 3 % nebulizer solution, Use twice a day as needed for congestion/cough, Disp: 750 mL, Rfl: 3  Current Facility-Administered Medications:  .  levalbuterol (XOPENEX) nebulizer solution 0.63 mg, 0.63 mg, Nebulization, Q6H, Martyn Ehrich, NP      Objective:   Vitals:   08/19/19 1121  BP: 140/80  Pulse: 87  Temp: (!) 97.5 F (36.4 C)  TempSrc: Temporal  SpO2: 95%  Weight: 182 lb 9.6 oz (82.8 kg)  Height: 5\' 6"  (1.676 m)    Estimated body mass index is 29.47 kg/m as calculated from the following:   Height as of this encounter: 5\' 6"  (1.676 m).   Weight as of this encounter: 182 lb 9.6 oz (82.8 kg).  @WEIGHTCHANGE @  [facility]   08/19/19 1121  Weight: 182 lb 9.6 oz (82.8 kg)     Physical Exam  General Appearance:    Alert, cooperative, no distress, appears stated age - yes , Deconditioned looking -  no , OBESE  - yes, Sitting on Wheelchair -  no  Head:    Normocephalic, without obvious abnormality, atraumatic  Eyes:    PERRL, conjunctiva/corneas clear,  Ears:    Normal TM's and external ear canals, both ears  Nose:   Nares normal, septum midline, mucosa normal, no drainage    or sinus tenderness. OXYGEN ON  - no . Patient is @ ra   Throat:   Lips, mucosa, and tongue normal; teeth and gums normal. Cyanosis on lips - no  Neck:   Supple, symmetrical, trachea midline, no adenopathy;    thyroid:  no enlargement/tenderness/nodules; no carotid   bruit or JVD  Back:     Symmetric, no curvature, ROM normal, no CVA tenderness  Lungs:     Distress - no , Wheeze no, Barrell Chest - yes, Purse lip breathing - no, Crackles - yes mild at base   Chest Wall:    No tenderness or deformity.    Heart:    Regular rate and rhythm, S1 and S2 normal, no rub   or gallop, Murmur - no  Breast Exam:    NOT DONE  Abdomen:     Soft, non-tender, bowel sounds active all four quadrants,    no masses, no organomegaly. Visceral obesity - ues  Genitalia:   NOT DONE  Rectal:   NOT DONE  Extremities:   Extremities - normal, Has Cane - no, Clubbing - no, Edema - no  Pulses:   2+ and symmetric all extremities  Skin:   Stigmata of Connective Tissue Disease - no  Lymph nodes:   Cervical, supraclavicular, and axillary nodes normal  Psychiatric:  Neurologic:   Pleasant - yes, Anxious - no, Flat affect - no  CAm-ICU - neg, Alert and Oriented x 3 - yes, Moves all 4s - yes, Speech - normal, Cognition - intact           Assessment:       ICD-10-CM   1. Stage 3 severe COPD by GOLD classification (HCC)  J44.9   2. Cigarette smoker  F17.210   3. Cancer screening  Z12.9        Plan:     Patient Instructions     ICD-10-CM   1.  Stage 3 severe COPD by GOLD classification (HCC) J44.9   2. COPD exacerbation (HCC) J44.1   3. Cigarette smoker F17.210   4. Cancer screening Z12.9    Stage 3 severe COPD by GOLD classification (HCC) - continueTRELEGY once daily - continue ventolin as needed - use 3% saline neb as needed - glad uptodate with flu shot - quit smoking - stop fish oil - can cause night acid reflux than can impact lungs - talk to Andris Baumann, MD and stop atenolol because it can affect your lungs  Cigarette smoker - quit smoking asap  Lung cancer screening  -no recurrence June 2020 -  Do followup ldct in July 2021   Followup 6 months -do CAT score and simple walk test in offie at followu     SIGNATURE    Dr. Kalman Shan, M.D., F.C.C.P,  Pulmonary and Critical Care Medicine Staff Physician, [facility] Director - Interstitial Lung Disease  Program  Pulmonary Fibrosis [facility] at [facility] [place], [name], [address]  Pager: [phone], If no answer or between  15:00h - 7:00h: call [phone] Telephone: [phone]  12:13 PM 08/19/2019

## 2019-09-03 ENCOUNTER — Other Ambulatory Visit: Payer: Self-pay | Admitting: Primary Care

## 2019-11-19 ENCOUNTER — Ambulatory Visit (INDEPENDENT_AMBULATORY_CARE_PROVIDER_SITE_OTHER): Payer: Medicare HMO | Admitting: Primary Care

## 2019-11-19 ENCOUNTER — Encounter: Payer: Self-pay | Admitting: Primary Care

## 2019-11-19 ENCOUNTER — Telehealth: Payer: Self-pay | Admitting: Internal Medicine

## 2019-11-19 ENCOUNTER — Other Ambulatory Visit: Payer: Self-pay

## 2019-11-19 DIAGNOSIS — J440 Chronic obstructive pulmonary disease with acute lower respiratory infection: Secondary | ICD-10-CM

## 2019-11-19 DIAGNOSIS — J209 Acute bronchitis, unspecified: Secondary | ICD-10-CM | POA: Diagnosis not present

## 2019-11-19 DIAGNOSIS — Z720 Tobacco use: Secondary | ICD-10-CM | POA: Diagnosis not present

## 2019-11-19 MED ORDER — DOXYCYCLINE HYCLATE 100 MG PO TABS: 100.0000 mg | ORAL_TABLET | Freq: Two times a day (BID) | ORAL | 0 refills | Status: DC

## 2019-11-19 NOTE — Progress Notes (Signed)
Virtual Visit via Telephone Note  I connected with Glen Lopez on 11/19/19 at  3:00 PM EST by telephone and verified that I am speaking with the correct person using two identifiers.  Location: Patient: Home Provider: Office   I discussed the limitations, risks, security and privacy concerns of performing an evaluation and management service by telephone and the availability of in person appointments. I also discussed with the patient that there may be a patient responsible charge related to this service. The patient expressed understanding and agreed to proceed.   History of Present Illness: 64 year old male, current everyday smoker. PMH severe COPD, chronic resp failure with hypoxia, HTN, tobacco use, post nasal drip. Patient of Dr. Marchelle Gearing, last seen for regular office visit on 08/19/19. Maintained on Trelegy 1 puff daily, prn ventolin and hypertonic saline nebulizer as needed. Continue to encourage complete smoking cessation. Following with lung cancer screening clinic, due LDCT in July 2021.   11/19/2019 Patient contacted today for acute televisit. Reports increased chest congestion with cough x 3 days. His breathing is ok. No significant shortness of breath, wheezing or chest tightness. Continue Trelegy 1 puff daily. Takes mucinex daily. He has hypertonic saline nebulizer and flutter valve which he uses as needed. He is working very hard on quitting smoking and is down to 2 cigarettes a day from 3 packs. He has noticed an improvement in how he feels and has not had any recent exacerbations.    Observations/Objective:  - Able to speak in full sentences - Some audible nasal/chest congestion - No observed shortness of breath, wheezing or cough  Assessment and Plan:  Acute Bronchitis with COPD - Chest congestion with cough x 3 days - No associated respiratory distress or bronchspasm - Rx Doxycycline 1 tab twice daily x 7 days - Continue Trelegy 1 puff daily; ventolin PRN q 6hrs  for sob/wheezing  - Recommend patient use hypertonic saline twice daily followed by flutter valve - Continue mucinex 600-1200mg  twice daily  Tobacco use - Continue to work hard on quitting, down to 2 cigarettes a day  - Encouraged complete cessation   Follow Up Instructions:  - Recommend regular follow-up in 3-4 months or sooner if symptoms do not improve  - Due for Lung cancer screening in July 2021   I discussed the assessment and treatment plan with the patient. The patient was provided an opportunity to ask questions and all were answered. The patient agreed with the plan and demonstrated an understanding of the instructions.   The patient was advised to call back or seek an in-person evaluation if the symptoms worsen or if the condition fails to improve as anticipated.  I provided 22 minutes of non-face-to-face time during this encounter.   [name], NP

## 2019-11-19 NOTE — Patient Instructions (Addendum)
Acute bronchitis: - Rx Doxycycline 1 tab twice daily x 7 days - Continue Trelegy 1 puff daily; use ventolin rescue inhaler every 6hrs for sob/wheezing  - Recommend use hypertonic saline twice daily followed by flutter valve - Continue mucinex 600-1200mg  twice daily  Smoking cessation: - Congratulations on all your hard work to cut down  - When you are ready to quit pick date on calender and circle in RED and write my quit date  Follow-up: - Recommend regular follow-up in 3-4 months or sooner if symptoms do not improve  - Due for Lung cancer screening in July 2021

## 2019-12-31 ENCOUNTER — Telehealth: Payer: Self-pay | Admitting: Primary Care

## 2019-12-31 ENCOUNTER — Encounter: Payer: Self-pay | Admitting: Primary Care

## 2019-12-31 ENCOUNTER — Ambulatory Visit (INDEPENDENT_AMBULATORY_CARE_PROVIDER_SITE_OTHER): Payer: Medicare HMO | Admitting: Primary Care

## 2019-12-31 DIAGNOSIS — J441 Chronic obstructive pulmonary disease with (acute) exacerbation: Secondary | ICD-10-CM | POA: Diagnosis not present

## 2019-12-31 MED ORDER — HYDROCOD POLST-CPM POLST ER 10-8 MG/5ML PO SUER: 5.0000 mL | Freq: Every evening | ORAL | 0 refills | Status: DC | PRN

## 2019-12-31 MED ORDER — PREDNISONE 10 MG PO TABS: ORAL_TABLET | ORAL | 0 refills | Status: DC

## 2019-12-31 MED ORDER — AZITHROMYCIN 250 MG PO TABS: ORAL_TABLET | ORAL | 0 refills | Status: DC

## 2019-12-31 NOTE — Patient Instructions (Addendum)
COPD exacerbation: - RX zpack, prednisone taper as directed and tussionex 64ml qhs for cough suppression - Continue Trelegy 1 puff daily; ventolin q 4-6 hours for sob/wheezing  - Continue mucinex 600 twice daily and flutter valve   Tobacco abuse: - Due for lung cancer screening in July 2021   COVID-19 Vaccine Information can be found at: PodExchange.nl For questions related to vaccine distribution or appointments, please email [email or web address] or call [phone].

## 2019-12-31 NOTE — Telephone Encounter (Signed)
Message sent to app of the day, Buelah Manis, NP  Waynetta Sandy,   This patient had a telephone visit on 11/19/19, dx of acute bronchitis with COPD. I called the patient back and he stated he feels the same symptoms coming back.   Has had a cough for last 3 days that is mildly productive. What comes up is "bubbly white". Patient denied shortness of breath, wheezing and chest tightness.   He has been using his flutter valve and gave himself a breathing treatment last night which seemed to help.  He has been added to your schedule at 2:30 as a televisit. Although mychart active he is not familiar with use of it. Scheduled at 2:30 in case he may need to had an xray done.

## 2019-12-31 NOTE — Progress Notes (Signed)
Virtual Visit via Telephone Note  I connected with Glen Lopez on 12/31/19 at  2:30 PM EST by telephone and verified that I am speaking with the correct person using two identifiers.  Location: Patient: Home Provider: Office   I discussed the limitations, risks, security and privacy concerns of performing an evaluation and management service by telephone and the availability of in person appointments. I also discussed with the patient that there may be a patient responsible charge related to this service. The patient expressed understanding and agreed to proceed.   History of Present Illness: 64 year old male, current everyday smoker. PMH severe COPD, chronic resp failure with hypoxia, HTN, tobacco use, post nasal drip. Patient of Dr. Marchelle Lopez. Maintained on Trelegy 1 puff daily, prn ventolin and hypertonic saline nebulizer as needed. Continue to encourage complete smoking cessation. Following with lung cancer screening clinic, due LDCT in July 2021.   Previous [facility] pulmonary encounter: 11/19/2019 Patient contacted today for acute televisit. Reports increased chest congestion with cough x 3 days. His breathing is ok. No significant shortness of breath, wheezing or chest tightness. Continue Trelegy 1 puff daily. Takes mucinex daily. He has hypertonic saline nebulizer and flutter valve which he uses as needed. He is working very hard on quitting smoking and is down to 2 cigarettes a day from 3 packs. He has noticed an improvement in how he feels and has not had any recent exacerbations.   12/31/2019 Patient contacted today for acute televisit with reports of dry cough for three days. States that his symptoms are worse at night and keeping him awake. Associated chest heaviness, wheezing and rattling when lying down flat. States that he has been sleeping in recliner. He is compliant with Trelegy. Using Ventolin inhaler every 4 hours. Taking mucinex twice dailty and use flutter valve. Denies sick  contact, fever, sweats, chills, HA, loss smell or taste or chest pain.    Observations/Objective:  Temp 98.7 O2 98-99% RA HR 70s  Assessment and Plan:  COPD exacerbation: - Last flare was 2 months ago  - RX zpack, prednisone taper as directed and tussionex 61ml qhs for cough suppression - Continue Trelegy 1 puff daily; prn ventolin q 4-6 hours for sob/wheezing  - Continue mucinex 600 twice daily and flutter valve   Tobacco abuse: - Due for lung cancer screening in July 2021   Follow Up Instructions:   - As needed if symptoms do not improve or worsen; due for regular follow-up in April/May  I discussed the assessment and treatment plan with the patient. The patient was provided an opportunity to ask questions and all were answered. The patient agreed with the plan and demonstrated an understanding of the instructions.   The patient was advised to call back or seek an in-person evaluation if the symptoms worsen or if the condition fails to improve as anticipated.  I provided 18 minutes of non-face-to-face time during this encounter.   Glen Bayley, NP

## 2020-01-03 NOTE — Telephone Encounter (Signed)
Patient completed TELEVISIT with Beth on 3/2.  Per Beth's instructions:  Instructions    Return if symptoms worsen or fail to improve. COPD exacerbation: - RX zpack, prednisone taperas directedand tussionex 75ml qhs for cough suppression - Continue Trelegy 1 puff daily; ventolin q 4-6 hours for sob/wheezing  - Continue mucinex 600 twice daily and flutter valve   Tobacco abuse: - Due for lung cancer screening in July 2021  COVID-19 Vaccine Information can be found at: PodExchange.nl For questions related to vaccine distribution or appointments, please [email or web address] or [phone].      Nothing further needed

## 2020-02-03 ENCOUNTER — Other Ambulatory Visit: Payer: Self-pay | Admitting: Internal Medicine

## 2020-03-05 ENCOUNTER — Other Ambulatory Visit: Payer: Self-pay | Admitting: Internal Medicine

## 2020-03-23 ENCOUNTER — Telehealth: Payer: Self-pay | Admitting: Primary Care

## 2020-03-23 ENCOUNTER — Telehealth: Payer: Self-pay

## 2020-03-23 NOTE — Telephone Encounter (Signed)
Spoke with pt, requesting an order for a POC.  Pt has not been seen in over 30 days and would require an OV and a qualifying walk to see if he tolerates a POC (per chart pt wears 3-4lpm prn).  Scheduled pt to see Beth for qualifying walk and OV.  Nothing further needed at this time- will close encounter.

## 2020-03-23 NOTE — Telephone Encounter (Signed)
NOTES ON FILE FROM [facility] [phone], SENT REFERRAL TO SCHEDULING 

## 2020-04-02 ENCOUNTER — Ambulatory Visit (INDEPENDENT_AMBULATORY_CARE_PROVIDER_SITE_OTHER): Payer: Medicare HMO | Admitting: Primary Care

## 2020-04-02 ENCOUNTER — Encounter: Payer: Self-pay | Admitting: Primary Care

## 2020-04-02 ENCOUNTER — Other Ambulatory Visit: Payer: Self-pay

## 2020-04-02 DIAGNOSIS — J9611 Chronic respiratory failure with hypoxia: Secondary | ICD-10-CM | POA: Diagnosis not present

## 2020-04-02 DIAGNOSIS — Z720 Tobacco use: Secondary | ICD-10-CM

## 2020-04-02 DIAGNOSIS — J438 Other emphysema: Secondary | ICD-10-CM

## 2020-04-02 MED ORDER — SODIUM CHLORIDE 3 % IN NEBU: INHALATION_SOLUTION | RESPIRATORY_TRACT | 3 refills | Status: DC

## 2020-04-02 MED ORDER — HYDROCOD POLST-CPM POLST ER 10-8 MG/5ML PO SUER: 5.0000 mL | Freq: Every evening | ORAL | 0 refills | Status: DC | PRN

## 2020-04-02 NOTE — Assessment & Plan Note (Signed)
-   He did not qualify for POC today, maintained O2 >92% on room air at normal to fast speed  - Check 6 min walk test - Consider pulmonary rehab if patient is willing to participate

## 2020-04-02 NOTE — Assessment & Plan Note (Addendum)
-   Stable, currently has some chest congestion but not signs of active infection or exacerbation. Last COPD exacerbation was in March treated with Azithromycin and prednisone taper  - Continue Trelegy 100 one puff daily  - Recommend patient take mucinex 600mg  twice daily; use hypertonic saline nebulizer twice daily and continue flutter valve 3 times daily followed by coughing exercises - Ok to refill Tussionex cough syrup 37ml at bedtime as needed only (PMP reviewed, no unexpected prescriptions found. Advised patient not to combine with other sedating medications, do not take while driving)

## 2020-04-02 NOTE — Patient Instructions (Addendum)
  Recommendations: Continue Trelegy 1 puff daily  Use hypertonic saline nebulizer twice daily for chest congestion Use flutter valve 3 times daily followed by coughing exercises Tussionex cough syrup 1ml at bedtime as needed only (do not combine with other sedating medications, do not take while driving)   Up coming apt: Scheduled for low-dose CT chest on June 8 at 9:30 AM  Follow-up: 6-minute walk in 3 months 3 months Marchelle Gearing or [place] NP

## 2020-04-02 NOTE — Progress Notes (Signed)
@Patient  ID: Glen Lopez, male    DOB: 1956/09/11, 64 y.o.   MRN: [id number]  Chief Complaint  Patient presents with  . Follow-up    Requesting POC-increased sob x 2-3 wks., cough heavy-white,wheezing    Referring provider: Donald Prose, MD  HPI: 64 year old male, current everyday smoker. PMH severe COPD, chronic resp failure with hypoxia, HTN, tobacco use, post nasal drip. Patient of Dr. Chase Caller. Maintained on Trelegy 100 1 puff daily, prn ventolin and hypertonic saline nebulizer as needed. Continue to encourage complete smoking cessation. Following with lung cancer screening clinic, due LDCT in June-July 2021.   Previous [facility] pulmonary encounter: 11/19/2019 Patient contacted today for acute televisit. Reports increased chest congestion with cough x 3 days. His breathing is ok. No significant shortness of breath, wheezing or chest tightness. Continue Trelegy 1 puff daily. Takes mucinex daily. He has hypertonic saline nebulizer and flutter valve which he uses as needed. He is working very hard on quitting smoking and is down to 2 cigarettes a day from 3 packs. He has noticed an improvement in how he feels and has not had any recent exacerbations.   12/31/2019 Patient contacted today for acute televisit with reports of dry cough for three days. States that his symptoms are worse at night and keeping him awake. Associated chest heaviness, wheezing and rattling when lying down flat. States that he has been sleeping in recliner. He is compliant with Trelegy. Using Ventolin inhaler every 4 hours. Taking mucinex twice dailty and use flutter valve. Denies sick contact, fever, sweats, chills, HA, loss smell or taste or chest pain.   04/02/2020 Patient presents today for 3 month follow-up. He has been doing well, no significant exacerbations since last visit. He currently has some chest congestion and reports difficulty getting mucus up. Sputum is clear. He does use his flutter valve. States that  Tussionex cough syrup helps at night when he has coughing fits. He takes medication only as needed, prescription lasts him 3 months. He has had no improvement with tessalon perles in the past. He did not qualify for POC today, maintained O2 >92% on ambulatory walk room air. He is scheduled for LDCT on June 8th.     Allergies  Allergen Reactions  . Codeine Nausea And Vomiting    Immunization History  Administered Date(s) Administered  . Influenza Split 09/23/2017  . Influenza, High Dose Seasonal PF 07/19/2018  . Influenza, Quadrivalent, Recombinant, Inj, Pf 07/29/2019  . Influenza,inj,Quad PF,6+ Mos 09/09/2015, 07/14/2016  . Influenza-Unspecified 07/31/2014  . PFIZER SARS-COV-2 Vaccination 02/02/2020, 02/16/2020  . Pneumococcal Conjugate-13 07/19/2018  . Pneumococcal Polysaccharide-23 10/31/2013, 07/19/2018    Past Medical History:  Diagnosis Date  . Anxiety   . Arthritis   . Asthma   . Bipolar disorder ([place])   . COPD (chronic obstructive pulmonary disease) ([place])   . Depression   . GERD (gastroesophageal reflux disease)   . Headache(784.0)   . Hiatal hernia   . Hypertension     Tobacco History: Social History   Tobacco Use  Smoking Status Current Every Day Smoker  . Packs/day: 0.25  . Years: 48.00  . Pack years: 12.00  . Types: Cigarettes  Smokeless Tobacco Never Used  Tobacco Comment   2-3 cigs per day/  ep   Ready to quit: Not Answered Counseling given: Not Answered Comment: 2-3 cigs per day/  ep   Outpatient Medications Prior to Visit  Medication Sig Dispense Refill  . albuterol (PROVENTIL) (2.5 MG/3ML) 0.083% nebulizer  solution Take 3 mLs (2.5 mg total) by nebulization every 6 (six) hours as needed. For shortness of breath 75 mL 5  . albuterol (VENTOLIN HFA) 108 (90 Base) MCG/ACT inhaler INHALE 2 PUFFS INTO THE LUNGS EVERY 4 (FOUR) HOURS AS NEEDED. FOR SHORTNESS OF BREATH 36 g 3  . aspirin EC 81 MG tablet Take 1 tablet (81 mg total) by mouth daily.    Marland Kitchen  atenolol-chlorthalidone (TENORETIC) 100-25 MG tablet Take 1 tablet by mouth daily.    Marland Kitchen atorvastatin (LIPITOR) 80 MG tablet     . lisinopril (ZESTRIL) 5 MG tablet     . lithium carbonate 300 MG capsule Take 300 mg by mouth 3 (three) times daily with meals.    . Multiple Vitamin (MULTIVITAMIN WITH MINERALS) TABS tablet Take 1 tablet by mouth daily.    . Omega-3 Fatty Acids (FISH OIL) 500 MG CAPS Take 500 mg by mouth daily.    Marland Kitchen omeprazole (PRILOSEC) 20 MG capsule Take 20 mg by mouth daily as needed (for acid reflux/indigestion).    Marland Kitchen Respiratory Therapy Supplies (FLUTTER) DEVI Use 3 times a day. 1 each 0  . TRELEGY ELLIPTA 100-62.5-25 MCG/INH AEPB INHALE 1 PUFF INTO THE LUNGS DAILY. 60 each 5  . chlorpheniramine-HYDROcodone (TUSSIONEX PENNKINETIC ER) 10-8 MG/5ML SUER Take 5 mLs by mouth at bedtime as needed for cough. 120 mL 0  . sodium chloride HYPERTONIC 3 % nebulizer solution Use twice a day as needed for congestion/cough 750 mL 3  . azithromycin (ZITHROMAX) 250 MG tablet Zpack taper as directed (Patient not taking: Reported on 04/02/2020) 6 tablet 0  . benzonatate (TESSALON) 200 MG capsule Take 1 capsule (200 mg total) by mouth 3 (three) times daily as needed for cough. (Patient not taking: Reported on 04/02/2020) 30 capsule 1  . predniSONE (DELTASONE) 10 MG tablet Take 4 tabs po daily x 2 days; then 3 tabs for 2 days; then 2 tabs for 2 days; then 1 tab for 2 days (Patient not taking: Reported on 04/02/2020) 20 tablet 0   Facility-Administered Medications Prior to Visit  Medication Dose Route Frequency Provider Last Rate Last Admin  . levalbuterol (XOPENEX) nebulizer solution 0.63 mg  0.63 mg Nebulization Q6H [name], NP       Review of Systems  Review of Systems  Constitutional: Negative.   Respiratory: Positive for cough. Negative for chest tightness, shortness of breath and wheezing.   Cardiovascular: Negative.      Physical Exam  BP 140/78 (BP Location: Right Arm, Cuff Size:  Normal)   Pulse 82   Temp 97.6 F (36.4 C) (Oral)   Ht 5\' 6"  (1.676 m)   Wt 182 lb 3.2 oz (82.6 kg)   SpO2 96%   BMI 29.41 kg/m  Physical Exam Constitutional:      General: He is not in acute distress.    Appearance: Normal appearance. He is not ill-appearing.  HENT:     Head: Normocephalic and atraumatic.     Right Ear: There is impacted cerumen.     Left Ear: There is impacted cerumen.     Mouth/Throat:     Mouth: Mucous membranes are moist.     Pharynx: Oropharynx is clear.  Cardiovascular:     Rate and Rhythm: Normal rate and regular rhythm.  Pulmonary:     Effort: Pulmonary effort is normal.     Comments: Expiratory wheeze LUL, improved with deep breathing. Fine rales at bases Musculoskeletal:  General: Normal range of motion.     Cervical back: Normal range of motion and neck supple.  Skin:    General: Skin is warm and dry.  Neurological:     General: No focal deficit present.     Mental Status: He is alert and oriented to person, place, and time. Mental status is at baseline.  Psychiatric:        Mood and Affect: Mood normal.        Behavior: Behavior normal.        Thought Content: Thought content normal.        Judgment: Judgment normal.      Lab Results:  CBC    Component Value Date/Time   WBC 16.3 (H) 12/09/2015 0557   RBC 4.38 12/09/2015 0557   HGB 14.0 12/09/2015 0557   HCT 43.6 12/09/2015 0557   PLT 111 (L) 12/09/2015 0557   MCV 99.5 12/09/2015 0557   MCH 32.0 12/09/2015 0557   MCHC 32.1 12/09/2015 0557   RDW 15.2 12/09/2015 0557   LYMPHSABS 0.7 12/08/2015 0551   MONOABS 0.3 12/08/2015 0551   EOSABS 0.0 12/08/2015 0551   BASOSABS 0.0 12/08/2015 0551    BMET    Component Value Date/Time   NA 139 12/09/2015 0557   K 4.7 12/09/2015 0557   CL 109 12/09/2015 0557   CO2 24 12/09/2015 0557   GLUCOSE 176 (H) 12/09/2015 0557   BUN 17 12/09/2015 0557   CREATININE 0.94 12/09/2015 0557   CALCIUM 8.4 (L) 12/09/2015 0557   GFRNONAA >60  12/09/2015 0557   GFRAA >60 12/09/2015 0557    BNP No results found for: BNP  ProBNP    Component Value Date/Time   PROBNP 125.2 (H) 10/31/2013 1645    Imaging: No results found.   Assessment & Plan:   COPD (chronic obstructive pulmonary disease) with emphysema - Stable, currently has some chest congestion but not signs of active infection or exacerbation. Last COPD exacerbation was in March treated with Azithromycin and prednisone taper  - Continue Trelegy 100 one puff daily  - Recommend patient take mucinex 600mg  twice daily; use hypertonic saline nebulizer twice daily and continue flutter valve 3 times daily followed by coughing exercises - Ok to refill Tussionex cough syrup 64ml at bedtime as needed only (PMP reviewed, no unexpected prescriptions found. Advised patient not to combine with other sedating medications, do not take while driving)    Tobacco use - Followed by lung cancer screening program; LDCT in June 2020 showed Lung-RADS 2 - Due for annual LDCT on June 8th 2021   Chronic respiratory failure with hypoxia (HCC) - He did not qualify for POC today, maintained O2 >92% on room air at normal to fast speed  - Check 6 min walk test - Consider pulmonary rehab if patient is willing to participate     07-28-1985, NP 04/02/2020

## 2020-04-02 NOTE — Assessment & Plan Note (Addendum)
-   Followed by lung cancer screening program; LDCT in June 2020 showed Lung-RADS 2 - Due for annual LDCT on June 8th 2021

## 2020-04-02 NOTE — Addendum Note (Signed)
Addended by: Charlott Holler on: 04/02/2020 10:33 AM   Modules accepted: Orders

## 2020-04-07 ENCOUNTER — Inpatient Hospital Stay: Admission: RE | Admit: 2020-04-07 | Payer: Medicaid Other | Source: Ambulatory Visit

## 2020-04-21 ENCOUNTER — Inpatient Hospital Stay: Admission: RE | Admit: 2020-04-21 | Payer: Medicaid Other | Source: Ambulatory Visit

## 2020-04-22 ENCOUNTER — Other Ambulatory Visit: Payer: Self-pay | Admitting: Acute Care

## 2020-04-22 ENCOUNTER — Telehealth: Payer: Self-pay | Admitting: Internal Medicine

## 2020-04-22 DIAGNOSIS — Z87891 Personal history of nicotine dependence: Secondary | ICD-10-CM

## 2020-04-22 DIAGNOSIS — Z122 Encounter for screening for malignant neoplasm of respiratory organs: Secondary | ICD-10-CM

## 2020-04-22 DIAGNOSIS — F17210 Nicotine dependence, cigarettes, uncomplicated: Secondary | ICD-10-CM

## 2020-04-22 MED ORDER — DOXYCYCLINE HYCLATE 100 MG PO TABS: 100.0000 mg | ORAL_TABLET | Freq: Two times a day (BID) | ORAL | 0 refills | Status: DC

## 2020-04-22 MED ORDER — PREDNISONE 10 MG PO TABS: ORAL_TABLET | ORAL | 0 refills | Status: DC

## 2020-04-22 NOTE — Telephone Encounter (Signed)
Sent in prescription for Doxycyline 1 tab twice daily x 7 days and prednisone taper for COPD exacerbation

## 2020-04-22 NOTE — Telephone Encounter (Signed)
Called and spoke with patient.  Let patient know that Doxycycline and prednisone were sent to the pharmacy for him.  He verbalized understanding.  He stated that he wanted them sent to [facility] on [address] and not [employer].  I contacted [facility] pharmacy and cancelled the order placed at that location and sent it to the [facility] on [address] as requested by patient.  Nothing further needed.

## 2020-04-22 NOTE — Telephone Encounter (Signed)
Called and spoke with pt who stated 2 weeks ago when he was at office he was having rattling in chest. Pt states he still has the rattling in chest when he breathes along with wheezing and also states he is coughing up yellow phlegm.  Pt denies any complaints of fever.  Pt states his SOB is about the same as it was at last visit with [facility].  Pt stated that he has been using his rescue inhaler about 4 times daily and has been using his nebulizer every night. Pt is using his trelegy inhaler and all other meds as prescribed.  Pt is requesting to have an abx prescribed as he stated that he was told by [facility] to call office if still had any symptoms and felt like he needed an abx.  Beth, please advise on this for pt and any other recommendations.

## 2020-04-23 ENCOUNTER — Inpatient Hospital Stay: Admission: RE | Admit: 2020-04-23 | Payer: Medicaid Other | Source: Ambulatory Visit

## 2020-05-05 ENCOUNTER — Other Ambulatory Visit (HOSPITAL_COMMUNITY): Payer: Self-pay | Admitting: Cardiology

## 2020-05-05 NOTE — Progress Notes (Signed)
CARDIOLOGY CONSULT NOTE       Patient ID: Glen Lopez MRN: [id number] DOB/AGE: Feb 02, 1956 64 y.o.  Admit date: (Not on file) Referring Physician: Sun Primary Physician: Glen James, MD Primary Cardiologist: Glen Lopez-> [facility] Reason for Consultation: CAD  Active Problems:   * No active Lopez problems. *   HPI:  64 y.o. referred by DR Glen Lopez for CAD. History of asthma, bipolar, COPD, GERD, HTN and family history of CAD with father having MI at age 76. He has not had good medical f/u or compliance for 2 years. Sees Glen Lopez for his COPD Gold 3 Seen by Glen Lopez for chest pain/ CAD. Had a stent to the circumflex coronary artery Done by Dr Glen Lopez In acute setting with chest pain , diaphoresis and dyspnea ECG acute inferior ST elevation MI tight lesion in mid circumflex near OM1 bifurcation no other significant disease Notes indicate " plaque shift" with need for kissing balloon dilatation of distal circumflex EF estimated 45-50%   He is married and his 52 1 yo son is with him with 3 grand kids Unfortunately son's wife got hooked on heroin  Home life is very stressful to he continues to smoke He finished a year of DAT but has not been taking his Tenoretic And ACE regularly I discussed this with him given his elevated BP and HR  He has had his COVID vaccine No angina   ROS All other systems reviewed and negative except as noted above  Past Medical History:  Diagnosis Date  . Anxiety   . Arthritis   . Asthma   . Bipolar disorder (HCC)   . COPD (chronic obstructive Lopez disease) (HCC)   . Depression   . GERD (gastroesophageal reflux disease)   . Headache(784.0)   . Hiatal hernia   . Hypertension     Family History  Problem Relation Age of Onset  . Heart disease Father        MI at age 9  . Asthma Sister     Social History   Socioeconomic History  . Marital status: Married    Spouse name: Not on file  . Number of children: 4   . Years of education: Not on file  . Highest education level: Not on file  Occupational History  . Occupation: disbaled  Tobacco Use  . Smoking status: Current Every Day Smoker    Packs/day: 0.25    Years: 48.00    Pack years: 12.00    Types: Cigarettes  . Smokeless tobacco: Never Used  . Tobacco comment: 2-3 cigs per day/  ep  Substance and Sexual Activity  . Alcohol use: No    Alcohol/week: 0.0 standard drinks  . Drug use: Yes    Types: Cocaine    Comment: First tiem user  . Sexual activity: Not on file  Other Topics Concern  . Not on file  Social History Narrative  . Not on file   Social Determinants of Health   Financial Resource Strain:   . Difficulty of Paying Living Expenses:   Food Insecurity:   . Worried About Programme researcher, broadcasting/film/video in the Last Year:   . Barista in the Last Year:   Transportation Needs:   . Freight forwarder (Medical):   Marland Kitchen Lack of Transportation (Non-Medical):   Physical Activity:   . Days of Exercise per Week:   . Minutes of Exercise per Session:   Stress:   . Feeling  of Stress :   Social Connections:   . Frequency of Communication with Friends and Family:   . Frequency of Social Gatherings with Friends and Family:   . Attends Religious Services:   . Active Member of Clubs or Organizations:   . Attends Banker Meetings:   Marland Kitchen Marital Status:   Intimate Partner Violence:   . Fear of Current or Ex-Partner:   . Emotionally Abused:   Marland Kitchen Physically Abused:   . Sexually Abused:     Past Surgical History:  Procedure Laterality Date  . artheroscopic knee surgery R Right 01/2013      Current Outpatient Medications:  .  albuterol (PROVENTIL) (2.5 MG/3ML) 0.083% nebulizer solution, Take 3 mLs (2.5 mg total) by nebulization every 6 (six) hours as needed. For shortness of breath, Disp: 75 mL, Rfl: 5 .  albuterol (VENTOLIN HFA) 108 (90 Base) MCG/ACT inhaler, INHALE 2 PUFFS INTO THE LUNGS EVERY 4 (FOUR) HOURS AS NEEDED. FOR  SHORTNESS OF BREATH, Disp: 36 g, Rfl: 3 .  aspirin EC 81 MG tablet, Take 1 tablet (81 mg total) by mouth daily., Disp: , Rfl:  .  atenolol-chlorthalidone (TENORETIC) 100-25 MG tablet, Take 1 tablet by mouth daily., Disp: , Rfl:  .  atorvastatin (LIPITOR) 80 MG tablet, , Disp: , Rfl:  .  chlorpheniramine-HYDROcodone (TUSSIONEX PENNKINETIC ER) 10-8 MG/5ML SUER, Take 5 mLs by mouth at bedtime as needed for cough., Disp: 120 mL, Rfl: 0 .  lisinopril (ZESTRIL) 5 MG tablet, , Disp: , Rfl:  .  lithium carbonate 300 MG capsule, Take 300 mg by mouth 3 (three) times daily with meals., Disp: , Rfl:  .  Multiple Vitamin (MULTIVITAMIN WITH MINERALS) TABS tablet, Take 1 tablet by mouth daily., Disp: , Rfl:  .  Omega-3 Fatty Acids (FISH OIL) 500 MG CAPS, Take 500 mg by mouth daily., Disp: , Rfl:  .  omeprazole (PRILOSEC) 20 MG capsule, Take 20 mg by mouth daily as needed (for acid reflux/indigestion)., Disp: , Rfl:  .  Respiratory Therapy Supplies (FLUTTER) DEVI, Use 3 times a day., Disp: 1 each, Rfl: 0 .  sodium chloride HYPERTONIC 3 % nebulizer solution, Use twice a day as needed for congestion/cough, Disp: 750 mL, Rfl: 3 .  TRELEGY ELLIPTA 100-62.5-25 MCG/INH AEPB, INHALE 1 PUFF INTO THE LUNGS DAILY., Disp: 60 each, Rfl: 5  Current Facility-Administered Medications:  .  levalbuterol (XOPENEX) nebulizer solution 0.63 mg, 0.63 mg, Nebulization, Q6H, Glen Bayley, NP . levalbuterol  0.63 mg Nebulization Q6H     Physical Exam: Blood pressure (!) 160/82, pulse 91, height 5\' 6"  (1.676 m), weight 182 lb (82.6 kg), SpO2 94 %.    Affect appropriate Chronically ill male  HEENT: normal Neck supple with no adenopathy JVP normal no bruits no thyromegaly Lungs expiratory wheezing and good diaphragmatic motion Heart:  S1/S2 no murmur, no rub, gallop or click PMI normal Abdomen: benighn, BS positve, no tenderness, no AAA no bruit.  No HSM or HJR Distal pulses intact with no bruits No edema Neuro  non-focal Skin warm and dry No muscular weakness   Labs:   Lab Results  Component Value Date   WBC 16.3 (H) 12/09/2015   HGB 14.0 12/09/2015   HCT 43.6 12/09/2015   MCV 99.5 12/09/2015   PLT 111 (L) 12/09/2015   No results for input(s): NA, K, CL, CO2, BUN, CREATININE, CALCIUM, PROT, BILITOT, ALKPHOS, ALT, AST, GLUCOSE in the last 168 hours.  Invalid input(s): LABALBU Lab Results  Component  Value Date   TROPONINI 0.13 (H) 12/08/2015   No results found for: CHOL No results found for: HDL No results found for: LDLCALC No results found for: TRIG No results found for: CHOLHDL No results found for: LDLDIRECT    Radiology: No results found.  EKG: SR rate 108 normal 11/13/16 05/08/20 SR rate 91 normal    ASSESSMENT AND PLAN:   1. CAD:  Stent to circumflex in setting of STEMI June 7 Lopez.  Discussed importance of taking Tenoretic and ACE no angina continue medical Rx At high risk for recurrent events with non compliance, smoking and complicated bifurcation procedure  Fortunately he had no disease in RCA/LAD with low normal EF and currently normal ECG  2. COPD:  Gold 3 with persistent smoking lung cancer CT negative 04/05/19 f/u with [facility] Lopez  3. HLD  On high dose lipitor labs with primary target LDL < 70 4. HTN:  Non compliant with meds see above  5. Bipolar:  Mood seems appropriate on lithium ? F/u primary   Signed: Charlton Haws 05/08/2020, 4:31 PM

## 2020-05-08 ENCOUNTER — Ambulatory Visit (INDEPENDENT_AMBULATORY_CARE_PROVIDER_SITE_OTHER): Payer: Medicare HMO | Admitting: Cardiovascular Disease

## 2020-05-08 ENCOUNTER — Other Ambulatory Visit: Payer: Self-pay

## 2020-05-08 ENCOUNTER — Encounter: Payer: Self-pay | Admitting: Cardiovascular Disease

## 2020-05-08 VITALS — BP 160/82 | HR 91 | Ht 66.0 in | Wt 182.0 lb

## 2020-05-08 DIAGNOSIS — I25118 Atherosclerotic heart disease of native coronary artery with other forms of angina pectoris: Secondary | ICD-10-CM | POA: Diagnosis not present

## 2020-05-08 DIAGNOSIS — I10 Essential (primary) hypertension: Secondary | ICD-10-CM

## 2020-05-08 NOTE — Patient Instructions (Addendum)
Medication Instructions:  *If you need a refill on your cardiac medications before your next appointment, please call your pharmacy*  Lab Work: If you have labs (blood work) drawn today and your tests are completely normal, you will receive your results only by: . MyChart Message (if you have MyChart) OR . A paper copy in the mail If you have any lab test that is abnormal or we need to change your treatment, we will call you to review the results.  Testing/Procedures: None ordered today.  Follow-Up: At [facility], you and your health needs are our priority.  As part of our continuing mission to provide you with exceptional heart care, we have created designated Provider Care Teams.  These Care Teams include your primary Cardiologist (physician) and Advanced Practice Providers (APPs -  Physician Assistants and Nurse Practitioners) who all work together to provide you with the care you need, when you need it.  We recommend signing up for the patient portal called "MyChart".  Sign up information is provided on this After Visit Summary.  MyChart is used to connect with patients for Virtual Visits (Telemedicine).  Patients are able to view lab/test results, encounter notes, upcoming appointments, etc.  Non-urgent messages can be sent to your provider as well.   To learn more about what you can do with MyChart, go to [email or web address].    Your next appointment:   3 month(s)  The format for your next appointment:   In Person  Provider:   You may see Dr. Nishan or one of the following Advanced Practice Providers on your designated Care Team:    Lori Gerhardt, NP  Laura Ingold, NP  Jill McDaniel, NP     

## 2020-05-20 ENCOUNTER — Ambulatory Visit (HOSPITAL_COMMUNITY)
Admission: RE | Admit: 2020-05-20 | Discharge: 2020-05-20 | Disposition: A | Discharge status: Home / Self Care | Payer: Medicare HMO | Source: Ambulatory Visit | Attending: Acute Care | Admitting: Acute Care

## 2020-05-20 ENCOUNTER — Other Ambulatory Visit: Payer: Self-pay

## 2020-05-20 DIAGNOSIS — Z87891 Personal history of nicotine dependence: Secondary | ICD-10-CM | POA: Insufficient documentation

## 2020-05-20 DIAGNOSIS — Z122 Encounter for screening for malignant neoplasm of respiratory organs: Secondary | ICD-10-CM | POA: Insufficient documentation

## 2020-05-20 DIAGNOSIS — F17200 Nicotine dependence, unspecified, uncomplicated: Secondary | ICD-10-CM | POA: Diagnosis present

## 2020-05-20 DIAGNOSIS — F17210 Nicotine dependence, cigarettes, uncomplicated: Secondary | ICD-10-CM

## 2020-05-21 ENCOUNTER — Telehealth: Payer: Self-pay | Admitting: Cardiovascular Disease

## 2020-05-21 DIAGNOSIS — R079 Chest pain, unspecified: Secondary | ICD-10-CM

## 2020-05-21 NOTE — Telephone Encounter (Signed)
Called patient back about his message. Patient wants to have CT done for his heart. Patient recently had CT for lung cancer screening on 05/20/20. Informed patient that a message would be sent to Dr. Eden Emms to see what test he would like patient to have. Informed patient we will call him with Dr. Fabio Bering advisement.

## 2020-05-21 NOTE — Telephone Encounter (Signed)
He is not a candidate for cardiac CT as he has had a stent already and can't see through this. If he has chest pain in future will need myovue or repeat cath

## 2020-05-21 NOTE — Telephone Encounter (Signed)
Patient is requesting an order for a CT.

## 2020-05-21 NOTE — Progress Notes (Signed)
Please call patient and let them  know their  low dose Ct was read as a Lung RADS 2: nodules that are benign in appearance and behavior with a very low likelihood of becoming a clinically active cancer due to size or lack of growth. Recommendation per radiology is for a repeat LDCT in 12 months. .Please let them  know we will order and schedule their  annual screening scan for 04/2021. Please let them  know there was notation of CAD on their  scan.  Please remind the patient  that this is a non-gated exam therefore degree or severity of disease  cannot be determined. Please have them  follow up with their PCP regarding potential risk factor modification, dietary therapy or pharmacologic therapy if clinically indicated. Pt.  is  currently on statin therapy. Please place order for annual  screening scan for  04/2021 and fax results to PCP. Thanks so much.  Angelique Blonder, please let the patient know there was also notation of Hepatic steatosis. This  is a term that describes the build up of fat in the liver. It is normal to have small amounts of fat in your liver, but when the proportion of liver cells that contain fat exceeds more than 5% it is indicative of early stage fatty liver.Treatment often involves reducing risk factors through a diet and exercise plan. It is generally a benign condition, but in a small percentage of patients it does require follow up. Please have the patient follow up with PCP regarding potential risk factor modification, dietary therapy or pharmacologic therapy if clinically indicated.  Additionally there was notation of cirrhosis, and emphysema. Thanks so much

## 2020-05-22 MED ORDER — NITROGLYCERIN 0.4 MG SL SUBL
0.4000 mg | SUBLINGUAL_TABLET | SUBLINGUAL | 3 refills | Status: DC | PRN
Start: 2020-05-22 — End: 2021-11-19

## 2020-05-22 NOTE — Telephone Encounter (Signed)
Called patient back to let him know of Dr. Fabio Bering recommendations. Patient stated he has chest discomfort here and there and would like to have myoview stress test. Ordered Nitroglycerin for patient to take until stress test can be done. Went over instruction with patient on how to take nitro. Encouraged patient to go to ED if chest discomfort is not relieved by nitro. Patient verbalized understanding.

## 2020-05-25 ENCOUNTER — Other Ambulatory Visit: Payer: Self-pay | Admitting: *Deleted

## 2020-05-25 DIAGNOSIS — Z87891 Personal history of nicotine dependence: Secondary | ICD-10-CM

## 2020-05-25 DIAGNOSIS — F17210 Nicotine dependence, cigarettes, uncomplicated: Secondary | ICD-10-CM

## 2020-05-29 NOTE — Telephone Encounter (Signed)
Will send message to [facility] to schedule patient for test. Called patient with instructions.

## 2020-06-03 ENCOUNTER — Other Ambulatory Visit (HOSPITAL_COMMUNITY): Payer: Self-pay | Admitting: Family Medicine

## 2020-06-05 ENCOUNTER — Telehealth (HOSPITAL_COMMUNITY): Payer: Self-pay | Admitting: *Deleted

## 2020-06-05 NOTE — Telephone Encounter (Signed)

## 2020-06-10 ENCOUNTER — Encounter (HOSPITAL_COMMUNITY): Payer: Medicare HMO

## 2020-06-15 ENCOUNTER — Encounter (HOSPITAL_COMMUNITY): Payer: Self-pay | Admitting: Cardiovascular Disease

## 2020-06-25 ENCOUNTER — Telehealth (HOSPITAL_COMMUNITY): Payer: Self-pay | Admitting: Cardiovascular Disease

## 2020-06-25 NOTE — Telephone Encounter (Signed)
Just an FYI. We have made several attempts to contact this patient including sending a letter to schedule or reschedule their Myoview. We will be removing the patient from the Nuclear W[facility].   06/15/2020 MAILER LETTER [facility]  08/11//21 Pt No Showed /[facility] to reschedule @ 10:58/[facility]  06/01/20 [facility] to schedule @ 8:28am/[facility]  05/29/20 [facility] to schedule @ 3:09/[facility]      Thank you

## 2020-07-03 ENCOUNTER — Other Ambulatory Visit: Payer: Self-pay | Admitting: Internal Medicine

## 2020-07-31 ENCOUNTER — Other Ambulatory Visit: Payer: Self-pay | Admitting: Primary Care

## 2020-08-03 ENCOUNTER — Ambulatory Visit: Payer: Medicaid Other

## 2020-08-04 ENCOUNTER — Telehealth: Payer: Self-pay | Admitting: Primary Care

## 2020-08-04 MED ORDER — TRELEGY ELLIPTA 100-62.5-25 MCG/INH IN AEPB: INHALATION_SPRAY | RESPIRATORY_TRACT | 5 refills | Status: DC

## 2020-08-04 NOTE — Telephone Encounter (Signed)
Spoke with the pt  Refilled his trelegy and gave appt to see MR since he was overdue  Nothing further needed

## 2020-08-05 ENCOUNTER — Telehealth: Payer: Self-pay

## 2020-08-05 NOTE — Telephone Encounter (Signed)
Left message for patient to call back  

## 2020-08-05 NOTE — Telephone Encounter (Signed)
-----   Message from Wendall Stade, MD sent at 08/04/2020  9:24 AM EDT ----- He was supposed to have myovue and no showed Call and see if we can get it done before 10/18 appointment

## 2020-08-17 ENCOUNTER — Ambulatory Visit: Payer: Medicare HMO | Admitting: Cardiovascular Disease

## 2020-09-14 ENCOUNTER — Other Ambulatory Visit: Payer: Self-pay

## 2020-09-14 ENCOUNTER — Other Ambulatory Visit: Payer: Self-pay | Admitting: Internal Medicine

## 2020-09-14 ENCOUNTER — Encounter: Payer: Self-pay | Admitting: Internal Medicine

## 2020-09-14 ENCOUNTER — Ambulatory Visit (INDEPENDENT_AMBULATORY_CARE_PROVIDER_SITE_OTHER): Payer: Medicare HMO | Admitting: Internal Medicine

## 2020-09-14 VITALS — BP 120/66 | HR 101 | Temp 98.1°F | Ht 66.5 in | Wt 177.0 lb

## 2020-09-14 DIAGNOSIS — J449 Chronic obstructive pulmonary disease, unspecified: Secondary | ICD-10-CM

## 2020-09-14 DIAGNOSIS — F17210 Nicotine dependence, cigarettes, uncomplicated: Secondary | ICD-10-CM | POA: Diagnosis not present

## 2020-09-14 DIAGNOSIS — Z129 Encounter for screening for malignant neoplasm, site unspecified: Secondary | ICD-10-CM | POA: Diagnosis not present

## 2020-09-14 DIAGNOSIS — J441 Chronic obstructive pulmonary disease with (acute) exacerbation: Secondary | ICD-10-CM | POA: Diagnosis not present

## 2020-09-14 MED ORDER — AZITHROMYCIN 250 MG PO TABS
250.0000 mg | ORAL_TABLET | ORAL | 0 refills | Status: DC
Start: 2020-09-14 — End: 2020-09-14

## 2020-09-14 MED ORDER — DALIRESP 250 MCG PO TABS: 1.0000 | ORAL_TABLET | Freq: Every day | ORAL | 0 refills | Status: DC

## 2020-09-14 MED ORDER — ROFLUMILAST 500 MCG PO TABS
500.0000 ug | ORAL_TABLET | Freq: Every day | ORAL | 11 refills | Status: DC
Start: 2020-09-14 — End: 2020-09-14

## 2020-09-14 NOTE — Progress Notes (Signed)
OV 07/14/2016  Chief Complaint  Patient presents with   Follow-up    9 month COPD follow up - reports breathing is near baseline with the change of seasons.    Fu copd - mod/severe with CT features of MAI early 2017 Fu smoking  Follows with ? Wife. Wants flu shot. Wants med refills. Cotninues to smoke; unable to quit. In feb 2017 says admitted for ae-copd and this scared him. Says now for months increased wheeze and yellow sputum and also cough with sinus drainage. He is off opioids. Does not want to go back to opioids due to addiction but cough is worse.    OV 11/23/2017  Chief Complaint  Patient presents with   Follow-up    Last seen 07/14/16. Pt states he has had a lot of congestion in head and chest, coughing with green mucus, and a lot of chest discomfort that has been happenng x5 days.  64 year old male with COPD not otherwise specified.  Features of MAI in 2017.  Active smoker  This is a routine visit.  Is on Spiriva and Symbicort which he says he is compliant with. He continues to smoke.  Has difficulty quitting.  He tells me that for the last 5 or 6 days he has had increased cough congestion wheezing yellow green sputum and severe shortness of breath.  There is no fever or chills.  No flu exposure.  COPD CAT score is documented below and significantly high.   06/25/2018 Patient presents today for acute visit with complaints of shortness of breath x1 week with rest and activity. Normally he is functional, maintained on symbicort 160 and Spiriva. Needs new neb machine, has had it for over 5 years. Reports that it quit working and there is a burning smell coming from it. Wants different DME company. BP is elevated today. He had a cigarette before today's visit and he did not take blood pressure medication. Still smoking, 3-4 cig a day (previous 2ppd). Treated for COPD exacerbation with doxycycline and prednisone taper. CXR showed COPD-reactive airway disease. Probable  subsegmental atelectasis or early pneumonia in the lingula. Followup PA and lateral chest X-ray is recommended in 3-4 weeks   07/09/2018 Patient returns for 1-2 week fu COPD exacerbation. Fatigue has improved. Still has congested cough, feels it mostly in his lung bases. States that he gets short of breath with stairs and walking to the bathroom. Using rescue inhaler more than normal, approx 4-5 times a days. Taking Robitussin-DM. States that this is not his baseline. No fever but sweating. Denies cp. Mucinex added. Extended prednisone and started on levaquin for COPD exacerbation. CXR repeated and showed no active process.   07/19/2018 Patient presents today for 10 fu regarding persistent COPD exacerbation. Patient reports that he is feeling a little better but still fatigues easily with activity. Continues to have congested cough. Has trouble getting mucus up when coughing which exacerbates his breathing. Takes Symbicort 160 and Spiriva as directed. Using albuterol inhaler every 4 hours. Continues mucinex twice daily and using flutter valve. Still smoking but has cutt back to less than 1/2 pack per day. Denies fever or chills.    OV 09/24/2018  Subjective:  Patient ID: Glen Lopez, male , DOB: 08/25/1956 , age 64 y.o. , MRN: [id number] , ADDRESS: [address] [place] [id number]   09/24/2018 -   Chief Complaint  Patient presents with   Follow-up    breathing same, little cough, SOB on exertion, little chest  tightness and wheezing     HPI Glen Lopez 64 y.o. -presents for follow-up of his advanced COPD.,  Smoking and follow-up of lung cancer screening  COPD advance: He had pulmonary function test today and it shows Gold stage III COPD with significant bronchodilator response.  He is on Spiriva and Symbicort.  Med review also shows that he is taking fish oil which might be causing acid reflux at night but he denies.  He is also on atenolol nonspecific beta-blocker.  He is  up-to-date with his flu shot.  He is willing to change to Trelegy inhaler  Smoking: He continues to smoke.  He says he smokes occasionally and only little.  However the entire room is smelling of tobacco.   Lung cancer screening: He had lung RADS-2 findings in February 2019.  His neck CT scan of the chest is set up for March 2020  On exam he was wheezing and then he admitted that he has slightly worsening symptoms than baseline of COPD with white and yellow sputum.  He thinks 5 days of prednisone would be okay although he is wary of taking longer prednisone because of weight gain and appetite change.  OV 08/19/2019  Subjective:  Patient ID: Glen Lopez, male , DOB: [birth year] , age 18 y.o. , MRN: [id number] , ADDRESS: [address] [name] [address]   08/19/2019 -   Chief Complaint  Patient presents with   Follow-up    COPD. Using albuterol prn and Trelegy daily. Reports his breathing has been at his baseline. Has already had Flu shot.      HPI Glen Lopez 64 y.o. -  Advanced copd follouw: last seen nov 2019 and then by app early 2020. boith times with aecopod. Siknce then no aecopd. On trelegy. Uses night o2 and portable o2 prn. Wants lighter portable system but does not want to wait for it. Uptodate with flu shot. No other issues. Continues to smoke. LDCT June 2020 without cancer.      IMPRESSION: Lung-RADS 2, benign appearance or behavior. Continue annual screening with low-dose chest CT without contrast in 12 months.  Aortic Atherosclerosis (ICD10-I70.0) and Emphysema (ICD10-J43.9).   Electronically Signed   By: Charline Bills M.D.   On: 04/05/2019 13:20    OV 09/14/2020  Subjective:  Patient ID: Glen Haste, male , DOB: 09/19/1956 , age 64 y.o. , MRN: [id number] , ADDRESS: [address] [name] [address] PCP [name], MD Patient Care Team: [name], MD as PCP - General (Family Medicine)  This Provider for this  visit: Treatment Team:  Attending Provider: Kalman Shan, MD    09/14/2020 -   Chief Complaint  Patient presents with   Follow-up    Breathing is unchanged since the last visit. He c/o increased cough for the past wk, esp at night- prod with yellow sputum. He is using his albuterol inhaler 4 x daily on average and neb with albuterol 3 x per wk.      HPI Glen Lopez 64 y.o. -presents for follow-up.  Personally saw him over a year ago.  Since then he seen Buelah Manis.  He is on Trelegy.  He uses oxygen at night and as needed based on subjective symptoms in the daytime.  He says when he goes shopping he will use his oxygen.  Otherwise he is easily within the car.  He thinks he is in a mild flareup with change in color of sputum.  He thinks Z-Pak will suffice.  He does not want prednisone.  He says nurse practitioner gave him opioid cough syrup recently and he wants a refill of this.  Advised him that currently my fingerprint sensor with opiate prescriptions is not working.  Moreover with COPD better to treat exacerbations.  He has reluctantly agreed for that strategy.  His last CT scan of the chest was in July 2021.  His next CT scan should be in July 2022.  Reviewed his medications.  And noticed that he is not on Daliresp.  He says he is taking prednisone 2 times this year and he will be taking Z-Pak this time for COPD exacerbation which makes 3 exacerbations this year.  He is willing to try Roflumilast after I discussed this with him.  He continues to smoke.  He says he does not want any smoking aids.  We discussed this for 3 minutes.  He says he will quit smoking on his own.  He understands the importance to quit smoking.  He is up-to-date with his Covid vaccines.     CAT COPD Symptom & Quality of Life Score (GSK trademark) 0 is no burden. 5 is highest burden 11/23/2017  09/24/2018   Never Cough -> Cough all the time 5 3  No phlegm in chest -> Chest is full of phlegm 5 4  No chest  tightness -> Chest feels very tight 5 2  No dyspnea for 1 flight stairs/hill -> Very dyspneic for 1 flight of stairs 5 5  No limitations for ADL at home -> Very limited with ADL at home 2 4  Confident leaving home -> Not at all confident leaving home 5 2  Sleep soundly -> Do not sleep soundly because of lung condition 2 4  Lots of Energy -> No energy at all 5 5  TOTAL Score (max 40)  34 34    CAT Score 09/14/2020 11/19/2019  Total CAT Score 34 36    IMPRESSION: CT chest 1. Lung-RADS 2, benign appearance or behavior. Continue annual screening with low-dose chest CT without contrast in 12 months. 2. Hepatic steatosis and cirrhosis. 3. Cholelithiasis. 4. Left nephrolithiasis. 5. Aortic Atherosclerosis (ICD10-I70.0) and Emphysema (ICD10-J43.9). Coronary artery atherosclerosis.   Electronically Signed   By: Jeronimo Greaves M.D.   On: 05/20/2020 15:19  ROS - per HPI     has a past medical history of Anxiety, Arthritis, Asthma, Bipolar disorder (HCC), COPD (chronic obstructive pulmonary disease) (HCC), Depression, GERD (gastroesophageal reflux disease), Headache(784.0), Hiatal hernia, and Hypertension.   reports that he has been smoking cigarettes. He has a 12.00 pack-year smoking history. He has never used smokeless tobacco.  Past Surgical History:  Procedure Laterality Date   artheroscopic knee surgery R Right 01/2013    Allergies  Allergen Reactions   Codeine Nausea And Vomiting    Immunization History  Administered Date(s) Administered   Influenza Split 09/23/2017   Influenza, High Dose Seasonal PF 07/19/2018   Influenza, Quadrivalent, Recombinant, Inj, Pf 07/29/2019   Influenza,inj,Quad PF,6+ Mos 09/09/2015, 07/14/2016   Influenza-Unspecified 07/31/2014, 07/31/2020   PFIZER SARS-COV-2 Vaccination 02/02/2020, 02/16/2020   Pneumococcal Conjugate-13 07/19/2018   Pneumococcal Polysaccharide-23 10/31/2013, 07/19/2018    Family History  Problem Relation Age  of Onset   Heart disease Father        MI at age 67   Asthma Sister      Current Outpatient Medications:    albuterol (PROVENTIL) (2.5 MG/3ML) 0.083% nebulizer solution, Take 3 mLs (2.5 mg  total) by nebulization every 6 (six) hours as needed. For shortness of breath, Disp: 75 mL, Rfl: 5   albuterol (VENTOLIN HFA) 108 (90 Base) MCG/ACT inhaler, INHALE 2 PUFFS INTO THE LUNGS EVERY 4 (FOUR) HOURS AS NEEDED. FOR SHORTNESS OF BREATH, Disp: 36 g, Rfl: 3   aspirin EC 81 MG tablet, Take 1 tablet (81 mg total) by mouth daily., Disp: , Rfl:    atenolol-chlorthalidone (TENORETIC) 100-25 MG tablet, Take 1 tablet by mouth daily., Disp: , Rfl:    atorvastatin (LIPITOR) 80 MG tablet, , Disp: , Rfl:    Fluticasone-Umeclidin-Vilant (TRELEGY ELLIPTA) 100-62.5-25 MCG/INH AEPB, INHALE 1 PUFF INTO THE LUNGS DAILY., Disp: 60 each, Rfl: 5   lisinopril (ZESTRIL) 5 MG tablet, , Disp: , Rfl:    lithium carbonate 300 MG capsule, Take 300 mg by mouth 3 (three) times daily with meals., Disp: , Rfl:    Multiple Vitamin (MULTIVITAMIN WITH MINERALS) TABS tablet, Take 1 tablet by mouth daily., Disp: , Rfl:    Omega-3 Fatty Acids (FISH OIL) 500 MG CAPS, Take 500 mg by mouth daily., Disp: , Rfl:    omeprazole (PRILOSEC) 20 MG capsule, Take 20 mg by mouth daily as needed (for acid reflux/indigestion)., Disp: , Rfl:    Respiratory Therapy Supplies (FLUTTER) DEVI, Use 3 times a day., Disp: 1 each, Rfl: 0   nitroGLYCERIN (NITROSTAT) 0.4 MG SL tablet, Place 1 tablet (0.4 mg total) under the tongue every 5 (five) minutes as needed for chest pain., Disp: 25 tablet, Rfl: 3  Current Facility-Administered Medications:    levalbuterol (XOPENEX) nebulizer solution 0.63 mg, 0.63 mg, Nebulization, Q6H, [name], NP      Objective:   Vitals:   09/14/20 0901  BP: 120/66  Pulse: (!) 101  Temp: 98.1 F (36.7 C)  TempSrc: Temporal  SpO2: 96%  Weight: 177 lb (80.3 kg)  Height: 5' 6.5" (1.689 m)     Estimated body mass index is 28.14 kg/m as calculated from the following:   Height as of this encounter: 5' 6.5" (1.689 m).   Weight as of this encounter: 177 lb (80.3 kg).  @WEIGHTCHANGE @    09/14/20 0901  Weight: 177 lb (80.3 kg)     Physical Exam General: No distress. Looks well Neuro: Alert and Oriented x 3. GCS 15. Speech normal Psych: Pleasant Resp:  Barrel Chest - yes.  Wheeze - no, Crackles - no, No overt respiratory distress CVS: Normal heart sounds. Murmurs - no Ext: Stigmata of Connective Tissue Disease - no putting HEENT: Normal upper airway. PEERL +. No post nasal drip        Assessment:       ICD-10-CM   1. Stage 3 severe COPD by GOLD classification (HCC)  J44.9   2. Cigarette smoker  F17.210   3. Cancer screening  Z12.9   4. COPD exacerbation (HCC)  J44.1   5. COPD, frequent exacerbations (HCC)  J44.1        Plan:     Patient Instructions     ICD-10-CM   1. Stage 3 severe COPD by GOLD classification (HCC)  J44.9   2. Cigarette smoker  F17.210   3. Cancer screening  Z12.9   4. COPD exacerbation (HCC)  J44.1   5. COPD, frequent exacerbations (HCC)  J44.1     Stage 3 severe COPD by GOLD classification (HCC) - continueTRELEGY once daily - continue ventolin as needed - use 3% saline neb as needed (you are using it 2  times per week) - glad uptodate with flu shot - glad you  Had covid vaccine - quit smoking - stop fish oil - can cause night acid reflux than can impact lungs - talk to Andris Baumann, MD and stop atenolol because it can affect your lungs - recommend holding off on hydrocodone cough syrup in seeting of copd - continue o2 at night and as needed with exertion  Cigarette smoker - we discussed for 3 minutes about your need to quit  - respect refusal of smoking quitting drugs and respect you willing to work on your own - quit smoking asap  Lung cancer screening  -no recurrence June 2020 -  Do followup ldct in July  2022  COPD exacerbation and frequent exacerbations  - Zpak 09/14/2020   - call for prednisone if not better - prefer to treat cough as a copd flare up  - reassess need for opipid cough syrup in future - will not prescribe today - Take new tablet called roflumilast 1 tablet   - starter pack per day after food x 30 days or 1 month  - then escalate to daily after food x continue at this dose  - any side effects especially GI call us   Followup 6 months -do CAT score and simple walk test in offie at followu     SIGNATURE    Dr. Kalman Shan, M.D., F.C.C.P,  Pulmonary and Critical Care Medicine Staff Physician, [facility] Center Director - Interstitial Lung Disease  Program  Pulmonary Fibrosis [facility] at [facility] [place], [name], [address]  Pager: [phone], If no answer or between  15:00h - 7:00h: call [phone] Telephone: [phone]  9:18 AM 09/14/2020

## 2020-09-14 NOTE — Patient Instructions (Addendum)
ICD-10-CM   1. Stage 3 severe COPD by GOLD classification (HCC)  J44.9   2. Cigarette smoker  F17.210   3. Cancer screening  Z12.9   4. COPD exacerbation (HCC)  J44.1   5. COPD, frequent exacerbations (HCC)  J44.1     Stage 3 severe COPD by GOLD classification (HCC) - continueTRELEGY once daily - continue ventolin as needed - use 3% saline neb as needed (you are using it 2 times per week) - glad uptodate with flu shot - glad you  Had covid vaccine - quit smoking - stop fish oil - can cause night acid reflux than can impact lungs - talk to Andris Baumann, MD and stop atenolol because it can affect your lungs - recommend holding off on hydrocodone cough syrup in seeting of copd - continue o2 at night and as needed with exertion  Cigarette smoker - we discussed for 3 minutes about your need to quit  - respect refusal of smoking quitting drugs and respect you willing to work on your own - quit smoking asap  Lung cancer screening  -no recurrence June 2020 -  Do followup ldct in July 2022  COPD exacerbation and frequent exacerbations  - Zpak 09/14/2020   - call for prednisone if not better - prefer to treat cough as a copd flare up  - reassess need for opipid cough syrup in future - will not prescribe today - Take new tablet called roflumilast 1 tablet   - starter pack per day after food x 30 days or 1 month  - then escalate to daily after food x continue at this dose  - any side effects especially GI call us   Followup 6 months -do CAT score and simple walk test in offie at followu

## 2020-10-12 ENCOUNTER — Other Ambulatory Visit: Payer: Self-pay | Admitting: Internal Medicine

## 2020-11-06 ENCOUNTER — Other Ambulatory Visit (HOSPITAL_COMMUNITY): Payer: Self-pay | Admitting: Cardiology

## 2020-12-11 ENCOUNTER — Other Ambulatory Visit (HOSPITAL_COMMUNITY): Payer: Self-pay | Admitting: Student

## 2020-12-17 ENCOUNTER — Other Ambulatory Visit (HOSPITAL_COMMUNITY): Payer: Self-pay | Admitting: Student

## 2020-12-25 ENCOUNTER — Telehealth: Payer: Self-pay | Admitting: Internal Medicine

## 2020-12-25 MED ORDER — AZITHROMYCIN 250 MG PO TABS: ORAL_TABLET | ORAL | 0 refills | Status: DC

## 2020-12-25 NOTE — Telephone Encounter (Signed)
Called and spoke with pt and he stated that he has head and chest congestion that he is getting up some yellow sputum.  He stated that he cannot stop coughing and would like to have something called into the pharmacy.  He stated that he normally gets a zpak and this knocks it out.  Pt denies any other symptoms like fever, chills, body aches.  MR is out of the office today.  VS please advise. Thanks   Allergies  Allergen Reactions  . Codeine Nausea And Vomiting

## 2020-12-25 NOTE — Telephone Encounter (Signed)
Spoke with patient. He is aware that the zpak will be called in for him. Verbalized understanding of instructions.   Nothing further needed at time of call.

## 2020-12-25 NOTE — Telephone Encounter (Signed)
Okay to send script for zpak.  He will need ROV if he doesn't improve.

## 2021-01-06 ENCOUNTER — Other Ambulatory Visit (HOSPITAL_COMMUNITY): Payer: Self-pay | Admitting: Student

## 2021-01-20 ENCOUNTER — Other Ambulatory Visit (HOSPITAL_COMMUNITY): Payer: Self-pay | Admitting: Student

## 2021-01-28 ENCOUNTER — Other Ambulatory Visit: Payer: Self-pay | Admitting: Internal Medicine

## 2021-02-17 ENCOUNTER — Other Ambulatory Visit (HOSPITAL_COMMUNITY): Payer: Self-pay

## 2021-02-17 ENCOUNTER — Other Ambulatory Visit: Payer: Self-pay | Admitting: Internal Medicine

## 2021-02-17 MED ORDER — ALBUTEROL SULFATE HFA 108 (90 BASE) MCG/ACT IN AERS
INHALATION_SPRAY | RESPIRATORY_TRACT | 3 refills | Status: DC
  Filled 2021-02-17: qty 36, 34d supply, fill #0
  Filled 2021-03-17 – 2021-03-18 (×2): qty 36, 34d supply, fill #1
  Filled 2021-05-13: qty 36, 34d supply, fill #2
  Filled 2021-07-29: qty 36, 34d supply, fill #3
  Filled 2021-07-29: qty 36, 34d supply, fill #0

## 2021-02-17 MED FILL — Roflumilast Tab 500 MCG: ORAL | 30 days supply | Qty: 30 | Fill #0 | Status: AC

## 2021-02-17 MED FILL — Alprazolam Tab 1 MG: ORAL | 30 days supply | Qty: 90 | Fill #0 | Status: AC

## 2021-03-04 ENCOUNTER — Other Ambulatory Visit (HOSPITAL_COMMUNITY): Payer: Self-pay

## 2021-03-04 MED ORDER — ALPRAZOLAM 0.5 MG PO TABS
ORAL_TABLET | ORAL | 1 refills | Status: DC
  Filled 2021-03-18: qty 60, 30d supply, fill #0
  Filled 2021-04-15: qty 60, 30d supply, fill #1

## 2021-03-09 ENCOUNTER — Other Ambulatory Visit (HOSPITAL_COMMUNITY): Payer: Self-pay

## 2021-03-09 MED FILL — Amlodipine Besylate Tab 10 MG (Base Equivalent): ORAL | 90 days supply | Qty: 90 | Fill #0 | Status: AC

## 2021-03-09 MED FILL — Loratadine Tab 10 MG: ORAL | 30 days supply | Qty: 30 | Fill #0 | Status: AC

## 2021-03-09 MED FILL — Atorvastatin Calcium Tab 80 MG (Base Equivalent): ORAL | 90 days supply | Qty: 90 | Fill #0 | Status: AC

## 2021-03-10 ENCOUNTER — Other Ambulatory Visit (HOSPITAL_COMMUNITY): Payer: Self-pay

## 2021-03-16 ENCOUNTER — Other Ambulatory Visit (HOSPITAL_COMMUNITY): Payer: Self-pay

## 2021-03-17 ENCOUNTER — Other Ambulatory Visit (HOSPITAL_COMMUNITY): Payer: Self-pay

## 2021-03-17 MED FILL — Roflumilast Tab 500 MCG: ORAL | 30 days supply | Qty: 30 | Fill #1 | Status: AC

## 2021-03-18 ENCOUNTER — Other Ambulatory Visit (HOSPITAL_COMMUNITY): Payer: Self-pay

## 2021-03-19 ENCOUNTER — Other Ambulatory Visit (HOSPITAL_COMMUNITY): Payer: Self-pay

## 2021-04-07 ENCOUNTER — Ambulatory Visit
Admission: RE | Admit: 2021-04-07 | Discharge: 2021-04-07 | Disposition: A | Discharge status: Home / Self Care | Payer: Medicare HMO | Source: Ambulatory Visit | Attending: Student | Admitting: Student

## 2021-04-07 ENCOUNTER — Other Ambulatory Visit: Payer: Self-pay | Admitting: Student

## 2021-04-07 DIAGNOSIS — W19XXXA Unspecified fall, initial encounter: Secondary | ICD-10-CM

## 2021-04-09 ENCOUNTER — Other Ambulatory Visit (HOSPITAL_COMMUNITY): Payer: Self-pay

## 2021-04-09 MED ORDER — HYDROCODONE-ACETAMINOPHEN 5-325 MG PO TABS
ORAL_TABLET | ORAL | 0 refills | Status: DC
  Filled 2021-04-09: qty 20, 5d supply, fill #0

## 2021-04-12 ENCOUNTER — Other Ambulatory Visit (HOSPITAL_COMMUNITY): Payer: Self-pay

## 2021-04-15 ENCOUNTER — Other Ambulatory Visit (HOSPITAL_COMMUNITY): Payer: Self-pay

## 2021-04-15 MED FILL — Roflumilast Tab 500 MCG: ORAL | 30 days supply | Qty: 30 | Fill #2 | Status: AC

## 2021-04-16 ENCOUNTER — Other Ambulatory Visit (HOSPITAL_COMMUNITY): Payer: Self-pay

## 2021-04-29 ENCOUNTER — Other Ambulatory Visit (HOSPITAL_COMMUNITY): Payer: Self-pay

## 2021-04-29 MED FILL — Gabapentin Cap 300 MG: ORAL | 90 days supply | Qty: 270 | Fill #0 | Status: AC

## 2021-04-30 ENCOUNTER — Other Ambulatory Visit (HOSPITAL_COMMUNITY): Payer: Self-pay

## 2021-04-30 MED ORDER — ALPRAZOLAM 0.5 MG PO TABS
ORAL_TABLET | ORAL | 1 refills | Status: DC
  Filled 2021-05-14: qty 60, 30d supply, fill #0
  Filled 2021-06-10 – 2021-06-11 (×2): qty 60, 30d supply, fill #1
  Filled ????-??-??: fill #0

## 2021-05-05 ENCOUNTER — Other Ambulatory Visit (HOSPITAL_COMMUNITY): Payer: Self-pay

## 2021-05-10 ENCOUNTER — Other Ambulatory Visit (HOSPITAL_COMMUNITY): Payer: Self-pay

## 2021-05-11 ENCOUNTER — Other Ambulatory Visit (HOSPITAL_COMMUNITY): Payer: Self-pay

## 2021-05-13 ENCOUNTER — Other Ambulatory Visit (HOSPITAL_COMMUNITY): Payer: Self-pay

## 2021-05-13 MED FILL — Roflumilast Tab 500 MCG: ORAL | 30 days supply | Qty: 30 | Fill #3 | Status: AC

## 2021-05-14 ENCOUNTER — Other Ambulatory Visit (HOSPITAL_COMMUNITY): Payer: Self-pay

## 2021-05-31 ENCOUNTER — Other Ambulatory Visit: Payer: Self-pay | Admitting: *Deleted

## 2021-05-31 DIAGNOSIS — Z87891 Personal history of nicotine dependence: Secondary | ICD-10-CM

## 2021-05-31 DIAGNOSIS — F17210 Nicotine dependence, cigarettes, uncomplicated: Secondary | ICD-10-CM

## 2021-06-03 ENCOUNTER — Other Ambulatory Visit (HOSPITAL_COMMUNITY): Payer: Self-pay

## 2021-06-07 ENCOUNTER — Other Ambulatory Visit (HOSPITAL_COMMUNITY): Payer: Self-pay

## 2021-06-07 MED FILL — Amlodipine Besylate Tab 10 MG (Base Equivalent): ORAL | 90 days supply | Qty: 90 | Fill #1 | Status: AC

## 2021-06-07 MED FILL — Lisinopril Tab 5 MG: ORAL | 90 days supply | Qty: 90 | Fill #0 | Status: AC

## 2021-06-07 MED FILL — Atorvastatin Calcium Tab 80 MG (Base Equivalent): ORAL | 90 days supply | Qty: 90 | Fill #1 | Status: AC

## 2021-06-11 ENCOUNTER — Other Ambulatory Visit (HOSPITAL_COMMUNITY): Payer: Self-pay

## 2021-06-16 ENCOUNTER — Other Ambulatory Visit (HOSPITAL_COMMUNITY): Payer: Self-pay

## 2021-06-16 MED ORDER — ROSUVASTATIN CALCIUM 40 MG PO TABS
40.0000 mg | ORAL_TABLET | Freq: Every day | ORAL | 3 refills | Status: DC
  Filled 2021-06-16: qty 90, 90d supply, fill #0

## 2021-06-16 MED ORDER — ONDANSETRON 4 MG PO TBDP
4.0000 mg | ORAL_TABLET | Freq: Four times a day (QID) | ORAL | 0 refills | Status: DC | PRN
  Filled 2021-06-16 – 2021-09-10 (×2): qty 30, 8d supply, fill #0

## 2021-06-21 ENCOUNTER — Other Ambulatory Visit (HOSPITAL_COMMUNITY): Payer: Self-pay

## 2021-06-21 MED ORDER — SERTRALINE HCL 50 MG PO TABS
ORAL_TABLET | ORAL | 3 refills | Status: DC
  Filled 2021-06-21: qty 30, 18d supply, fill #0
  Filled 2021-07-29: qty 30, 12d supply, fill #1

## 2021-06-21 MED ORDER — ROSUVASTATIN CALCIUM 20 MG PO TABS
20.0000 mg | ORAL_TABLET | Freq: Every day | ORAL | 3 refills | Status: DC
  Filled 2021-06-21: qty 90, 90d supply, fill #0
  Filled 2021-09-10: qty 90, 90d supply, fill #1

## 2021-06-21 MED ORDER — ALPRAZOLAM 0.5 MG PO TABS
0.5000 mg | ORAL_TABLET | Freq: Three times a day (TID) | ORAL | 1 refills | Status: DC | PRN
  Filled 2021-06-21 – 2021-07-02 (×2): qty 90, 30d supply, fill #0
  Filled 2021-07-29 – 2021-07-30 (×4): qty 90, 30d supply, fill #1

## 2021-06-22 ENCOUNTER — Other Ambulatory Visit (HOSPITAL_COMMUNITY): Payer: Self-pay

## 2021-06-28 ENCOUNTER — Other Ambulatory Visit (HOSPITAL_COMMUNITY): Payer: Self-pay

## 2021-06-28 MED FILL — Roflumilast Tab 500 MCG: ORAL | 30 days supply | Qty: 30 | Fill #4 | Status: CN

## 2021-06-28 MED FILL — Roflumilast Tab 500 MCG: ORAL | 30 days supply | Qty: 30 | Fill #0 | Status: AC

## 2021-06-29 ENCOUNTER — Other Ambulatory Visit (HOSPITAL_COMMUNITY): Payer: Self-pay

## 2021-06-30 ENCOUNTER — Ambulatory Visit
Admission: RE | Admit: 2021-06-30 | Discharge: 2021-06-30 | Disposition: A | Discharge status: Home / Self Care | Payer: Medicare HMO | Source: Ambulatory Visit | Attending: Acute Care | Admitting: Acute Care

## 2021-06-30 DIAGNOSIS — F17210 Nicotine dependence, cigarettes, uncomplicated: Secondary | ICD-10-CM

## 2021-06-30 DIAGNOSIS — Z87891 Personal history of nicotine dependence: Secondary | ICD-10-CM

## 2021-07-02 ENCOUNTER — Other Ambulatory Visit (HOSPITAL_COMMUNITY): Payer: Self-pay

## 2021-07-12 ENCOUNTER — Telehealth: Payer: Self-pay | Admitting: Acute Care

## 2021-07-12 NOTE — Progress Notes (Signed)
I have attempted to call the patient with the results of his low dose CT Chest. There was no answer. I have left a HIPPA compliant message with the office number and asked the patient to call the office in the morning for his results.   Angelique Blonder, 4A. Will need a 3 month follow up.

## 2021-07-12 NOTE — Telephone Encounter (Signed)
ATC, left VM.  Maralyn Sago, this patient is calling in regards to LDCT done on 06/30/21 wanting results. I know you like to call yourself. Thanks!

## 2021-07-13 ENCOUNTER — Other Ambulatory Visit: Payer: Self-pay | Admitting: *Deleted

## 2021-07-13 DIAGNOSIS — F17210 Nicotine dependence, cigarettes, uncomplicated: Secondary | ICD-10-CM

## 2021-07-13 DIAGNOSIS — R911 Solitary pulmonary nodule: Secondary | ICD-10-CM

## 2021-07-13 DIAGNOSIS — Z87891 Personal history of nicotine dependence: Secondary | ICD-10-CM

## 2021-07-13 NOTE — Progress Notes (Signed)
I have called the patient with the results of his low dose CT Chest. I explained that his scan was read as a Lung RADS 4 A : suspicious findings, either short term follow up in 3 months or alternatively  PET Scan evaluation may be considered when there is a solid component of  8 mm or larger.  When I asked him if he has been sick, he told me he has, I explained that we will do a follow up low dose CT in 3 months to ensure stability of the nodule. He is in agreement with the plan. I did tell him that there was notation of CAD and he is on statin therapy and followed by cardiology.  Angelique Blonder, please fax results to PCP and order 3 month follow up Low Dose CT. Thanks so much

## 2021-07-13 NOTE — Telephone Encounter (Signed)
CT results were faxed to PCP. Order has been placed for 3 mth nodule f/u CT.

## 2021-07-21 ENCOUNTER — Other Ambulatory Visit (HOSPITAL_COMMUNITY): Payer: Self-pay

## 2021-07-29 ENCOUNTER — Other Ambulatory Visit (HOSPITAL_COMMUNITY): Payer: Self-pay

## 2021-07-30 ENCOUNTER — Other Ambulatory Visit (HOSPITAL_COMMUNITY): Payer: Self-pay

## 2021-08-02 ENCOUNTER — Other Ambulatory Visit (HOSPITAL_COMMUNITY): Payer: Self-pay

## 2021-08-08 MED FILL — Roflumilast Tab 500 MCG: ORAL | 30 days supply | Qty: 30 | Fill #1 | Status: AC

## 2021-08-09 ENCOUNTER — Other Ambulatory Visit (HOSPITAL_COMMUNITY): Payer: Self-pay

## 2021-08-10 ENCOUNTER — Other Ambulatory Visit (HOSPITAL_COMMUNITY): Payer: Self-pay

## 2021-08-10 MED ORDER — SERTRALINE HCL 50 MG PO TABS
100.0000 mg | ORAL_TABLET | Freq: Every day | ORAL | 3 refills | Status: DC
  Filled 2021-08-10: qty 60, 30d supply, fill #0

## 2021-08-12 ENCOUNTER — Other Ambulatory Visit: Payer: Self-pay | Admitting: Internal Medicine

## 2021-08-12 ENCOUNTER — Other Ambulatory Visit (HOSPITAL_COMMUNITY): Payer: Self-pay

## 2021-08-12 MED ORDER — SERTRALINE HCL 100 MG PO TABS
100.0000 mg | ORAL_TABLET | Freq: Every day | ORAL | 3 refills | Status: DC
  Filled 2021-08-12 – 2021-09-10 (×2): qty 30, 30d supply, fill #0

## 2021-08-18 ENCOUNTER — Other Ambulatory Visit: Payer: Self-pay

## 2021-08-18 ENCOUNTER — Telehealth: Payer: Self-pay | Admitting: Internal Medicine

## 2021-08-18 MED ORDER — TRELEGY ELLIPTA 100-62.5-25 MCG/ACT IN AEPB: 1.0000 | INHALATION_SPRAY | Freq: Every day | RESPIRATORY_TRACT | 6 refills | Status: DC

## 2021-08-18 NOTE — Telephone Encounter (Signed)
Spoke to patient. Notified him of trelegy refill sent to [facility]. Nothing further needed at this time.

## 2021-08-20 ENCOUNTER — Other Ambulatory Visit (HOSPITAL_COMMUNITY): Payer: Self-pay

## 2021-08-23 ENCOUNTER — Other Ambulatory Visit (HOSPITAL_COMMUNITY): Payer: Self-pay

## 2021-08-24 ENCOUNTER — Other Ambulatory Visit (HOSPITAL_COMMUNITY): Payer: Self-pay

## 2021-08-24 MED ORDER — ALPRAZOLAM 0.5 MG PO TABS
0.5000 mg | ORAL_TABLET | Freq: Three times a day (TID) | ORAL | 0 refills | Status: DC | PRN
  Filled 2021-08-24 – 2021-08-27 (×2): qty 90, 30d supply, fill #0

## 2021-08-27 ENCOUNTER — Other Ambulatory Visit (HOSPITAL_COMMUNITY): Payer: Self-pay

## 2021-09-10 ENCOUNTER — Other Ambulatory Visit: Payer: Self-pay | Admitting: Internal Medicine

## 2021-09-10 ENCOUNTER — Other Ambulatory Visit (HOSPITAL_COMMUNITY): Payer: Self-pay

## 2021-09-10 MED FILL — Gabapentin Cap 300 MG: ORAL | 90 days supply | Qty: 270 | Fill #1 | Status: AC

## 2021-09-10 MED FILL — Amlodipine Besylate Tab 10 MG (Base Equivalent): ORAL | 90 days supply | Qty: 90 | Fill #2 | Status: AC

## 2021-09-10 MED FILL — Fluticasone Propionate Nasal Susp 50 MCG/ACT: NASAL | 30 days supply | Qty: 16 | Fill #0 | Status: AC

## 2021-09-10 MED FILL — Lisinopril Tab 5 MG: ORAL | 90 days supply | Qty: 90 | Fill #1 | Status: AC

## 2021-09-13 ENCOUNTER — Other Ambulatory Visit (HOSPITAL_COMMUNITY): Payer: Self-pay

## 2021-09-13 MED FILL — Roflumilast Tab 500 MCG: ORAL | 30 days supply | Qty: 30 | Fill #2 | Status: AC

## 2021-09-14 ENCOUNTER — Other Ambulatory Visit (HOSPITAL_COMMUNITY): Payer: Self-pay

## 2021-09-14 MED ORDER — ALPRAZOLAM 0.5 MG PO TABS
0.5000 mg | ORAL_TABLET | Freq: Three times a day (TID) | ORAL | 0 refills | Status: DC | PRN
  Filled 2021-09-24: qty 90, 30d supply, fill #0

## 2021-09-14 MED ORDER — ALBUTEROL SULFATE HFA 108 (90 BASE) MCG/ACT IN AERS
2.0000 | INHALATION_SPRAY | RESPIRATORY_TRACT | 0 refills | Status: DC | PRN
  Filled 2021-09-14 – 2021-11-17 (×2): qty 18, 17d supply, fill #0

## 2021-09-22 ENCOUNTER — Other Ambulatory Visit (HOSPITAL_COMMUNITY): Payer: Self-pay

## 2021-09-24 ENCOUNTER — Other Ambulatory Visit (HOSPITAL_COMMUNITY): Payer: Self-pay

## 2021-10-07 ENCOUNTER — Other Ambulatory Visit (HOSPITAL_COMMUNITY): Payer: Self-pay

## 2021-10-07 MED ORDER — AMLODIPINE BESYLATE 10 MG PO TABS
10.0000 mg | ORAL_TABLET | Freq: Every day | ORAL | 3 refills | Status: DC
  Filled 2021-10-07: qty 90, 90d supply, fill #0

## 2021-10-07 MED ORDER — CARVEDILOL 3.125 MG PO TABS
3.1250 mg | ORAL_TABLET | Freq: Two times a day (BID) | ORAL | 3 refills | Status: DC
  Filled 2021-10-07: qty 180, 90d supply, fill #0

## 2021-10-07 MED ORDER — LISINOPRIL 5 MG PO TABS
5.0000 mg | ORAL_TABLET | Freq: Every day | ORAL | 3 refills | Status: DC
  Filled 2021-10-07: qty 90, 90d supply, fill #0

## 2021-10-07 MED ORDER — ALPRAZOLAM 0.5 MG PO TABS
0.5000 mg | ORAL_TABLET | Freq: Three times a day (TID) | ORAL | 3 refills | Status: AC
  Filled 2021-10-07 – 2021-10-22 (×2): qty 90, 30d supply, fill #0
  Filled 2021-11-17 (×2): qty 90, 30d supply, fill #1
  Filled 2021-12-16 (×2): qty 90, 30d supply, fill #2
  Filled 2022-01-13: qty 90, 30d supply, fill #3

## 2021-10-07 MED ORDER — NITROGLYCERIN 0.4 MG SL SUBL
0.4000 mg | SUBLINGUAL_TABLET | SUBLINGUAL | 11 refills | Status: AC
  Filled 2021-10-07: qty 25, 5d supply, fill #0

## 2021-10-07 MED ORDER — SERTRALINE HCL 50 MG PO TABS
50.0000 mg | ORAL_TABLET | Freq: Every day | ORAL | 3 refills | Status: AC
  Filled 2021-10-07: qty 90, 90d supply, fill #0

## 2021-10-11 ENCOUNTER — Other Ambulatory Visit: Payer: Self-pay | Admitting: Internal Medicine

## 2021-10-11 ENCOUNTER — Other Ambulatory Visit (HOSPITAL_COMMUNITY): Payer: Self-pay

## 2021-10-12 ENCOUNTER — Telehealth: Payer: Self-pay | Admitting: Internal Medicine

## 2021-10-12 ENCOUNTER — Other Ambulatory Visit (HOSPITAL_COMMUNITY): Payer: Self-pay

## 2021-10-12 MED ORDER — ROFLUMILAST 500 MCG PO TABS
500.0000 ug | ORAL_TABLET | Freq: Every day | ORAL | 1 refills | Status: DC
  Filled 2021-10-12: qty 30, 30d supply, fill #0
  Filled 2021-11-24: qty 30, 30d supply, fill #1

## 2021-10-12 NOTE — Telephone Encounter (Signed)
I called the patient and let him know a refill of the Dalirusp has been sent to the pharmacy. Nothing further needed.

## 2021-10-21 ENCOUNTER — Other Ambulatory Visit (HOSPITAL_COMMUNITY): Payer: Self-pay

## 2021-10-22 ENCOUNTER — Other Ambulatory Visit (HOSPITAL_COMMUNITY): Payer: Self-pay

## 2021-11-05 ENCOUNTER — Telehealth: Payer: Self-pay | Admitting: Primary Care

## 2021-11-05 NOTE — Telephone Encounter (Signed)
I called the patient and he reports that he his having some nasal congestion and dry cough x 4 weeks and wants to know if he can have an ABT sent in and he has a follow up 11/19/21. He denies any fever, chills or body aches.  Please advise on refill.

## 2021-11-08 MED ORDER — PREDNISONE 10 MG PO TABS: ORAL_TABLET | ORAL | 0 refills | Status: AC

## 2021-11-08 MED ORDER — DOXYCYCLINE HYCLATE 100 MG PO TABS: 100.0000 mg | ORAL_TABLET | Freq: Two times a day (BID) | ORAL | 0 refills | Status: DC

## 2021-11-08 NOTE — Telephone Encounter (Signed)
Spoke with pt and reviewed recommendations from Dr. Marchelle Gearing including medication education. Verified pharmacy with pt and placed orders for Doxycycline and Prednisone. Pt stated understanding. Nothing further needed at this time.

## 2021-11-08 NOTE — Telephone Encounter (Signed)
At baseline he is on fish oil and also lisinopril all of which can contribute to chronic cough.  We can address this at follow-up.  For his current symptoms I recommend both antibiotics and prednisone   Take doxycycline 100mg  po twice daily x 5 days; take after meals and avoid sunlight   Please take prednisone 40 mg x1 day, then 30 mg x1 day, then 20 mg x1 day, then 10 mg x1 day, and then 5 mg x1 day and stop   Allergies  Allergen Reactions   Codeine Nausea And Vomiting     Current Outpatient Medications:    albuterol (PROVENTIL) (2.5 MG/3ML) 0.083% nebulizer solution, Take 3 mLs (2.5 mg total) by nebulization every 6 (six) hours as needed. For shortness of breath, Disp: 75 mL, Rfl: 5   albuterol (VENTOLIN HFA) 108 (90 Base) MCG/ACT inhaler, Inhale 2 puffs into the lungs every 4 (four) hours as needed for shortness of breath, Disp: 18 g, Rfl: 0   ALPRAZolam (XANAX) 0.5 MG tablet, Take 1 tablet by mouth 2 times a day for 30 days as needed for anxiety or sleep., Disp: 60 tablet, Rfl: 1   ALPRAZolam (XANAX) 0.5 MG tablet, Take 1 tablet (0.5 mg total) by mouth 3 (three) times daily as needed for panic attacks, Disp: 90 tablet, Rfl: 0   ALPRAZolam (XANAX) 0.5 MG tablet, Take 1 tablet (0.5 mg total) by mouth 3 (three) times daily as needed for panic attacks, Disp: 90 tablet, Rfl: 0   ALPRAZolam (XANAX) 0.5 MG tablet, Take 1 tablet (0.5 mg total) by mouth 3 (three) times daily as needed for panic attacks, Disp: 90 tablet, Rfl: 3   amLODipine (NORVASC) 10 MG tablet, Take 1 tablet (10 mg total) by mouth daily., Disp: 90 tablet, Rfl: 3   amLODipine (NORVASC) 5 MG tablet, TAKE 1 TABLET BY MOUTH ONCE A DAY 90, Disp: 90 tablet, Rfl: 1   amLODipine (NORVASC) 5 MG tablet, Take 1 tablet by mouth daily., Disp: , Rfl:    aspirin EC 81 MG tablet, Take 1 tablet (81 mg total) by mouth daily., Disp: , Rfl:    atenolol-chlorthalidone (TENORETIC) 100-25 MG tablet, Take 1 tablet by mouth daily., Disp: , Rfl:     atorvastatin (LIPITOR) 80 MG tablet, , Disp: , Rfl:    atorvastatin (LIPITOR) 80 MG tablet, TAKE 1 TABLET (80 MG) BY MOUTH ONCE DAILY, Disp: 90 tablet, Rfl: 3   atorvastatin (LIPITOR) 80 MG tablet, TAKE 1 TABLET (80 MG TOTAL) BY MOUTH DAILY AT 6PM., Disp: 90 tablet, Rfl: 2   atorvastatin (LIPITOR) 80 MG tablet, TAKE 1 TABLET (80 MG TOTAL) BY MOUTH DAILY AT 6PM., Disp: 90 tablet, Rfl: 2   azithromycin (ZITHROMAX) 500 MG tablet, TAKE 1 TABLET (500 MG) BY MOUTH ONCE DAILY FOR 5 DAYS, Disp: 5 tablet, Rfl: 0   carvedilol (COREG) 3.125 MG tablet, Take 1 tablet (3.125 mg total) by mouth 2 (two) times daily with food, Disp: 180 tablet, Rfl: 3   fluticasone (FLONASE) 50 MCG/ACT nasal spray, USE 2 SPRAYS (100 MCG) IN EACH NOSTRIL BY INTRANASAL ROUTE ONCE DAILY FOR 30 DAYS, Disp: 16 g, Rfl: 3   Fluticasone-Umeclidin-Vilant (TRELEGY ELLIPTA) 100-62.5-25 MCG/ACT AEPB, Inhale 1 puff into the lungs daily., Disp: 60 each, Rfl: 6   Fluticasone-Umeclidin-Vilant 100-62.5-25 MCG/INH AEPB, INHALE 1 PUFF INTO THE LUNGS ONCE DAILY AT THE SAME TIME EACH DAY, Disp: 120 each, Rfl: 0   gabapentin (NEURONTIN) 300 MG capsule, TAKE 1 CAPSULE (300 MG) BY MOUTH 3  TIMES PER DAY FOR 90 DAYS, Disp: 270 capsule, Rfl: 3   guaiFENesin (MUCINEX) 600 MG 12 hr tablet, TAKE 1 TABLET (600 MG) BY MOUTH EVERY 12 HOURS FOR 30 DAYS, Disp: 60 tablet, Rfl: 0   HYDROcodone-acetaminophen (NORCO/VICODIN) 5-325 MG tablet, Take 1 tablet my mouth every 6 hours as needed for pain for 5 days, Disp: 20 tablet, Rfl: 0   lisinopril (ZESTRIL) 5 MG tablet, , Disp: , Rfl:    lisinopril (ZESTRIL) 5 MG tablet, TAKE 1 TABLET (5 MG) BY MOUTH ONCE DAILY, Disp: 90 tablet, Rfl: 3   lisinopril (ZESTRIL) 5 MG tablet, Take 1 tablet (5 mg total) by mouth daily., Disp: 90 tablet, Rfl: 3   lithium carbonate 300 MG capsule, Take 300 mg by mouth 3 (three) times daily with meals., Disp: , Rfl:    loratadine (CLARITIN) 10 MG tablet, TAKE 1 TABLET (10 MG) BY MOUTH ONCE DAILY FOR  30 DAYS, Disp: 30 tablet, Rfl: 3   Multiple Vitamin (MULTIVITAMIN WITH MINERALS) TABS tablet, Take 1 tablet by mouth daily., Disp: , Rfl:    nitroGLYCERIN (NITROSTAT) 0.4 MG SL tablet, Place 1 tablet (0.4 mg total) under the tongue every 5 (five) minutes as needed for chest pain., Disp: 25 tablet, Rfl: 3   nitroGLYCERIN (NITROSTAT) 0.4 MG SL tablet, Place 1 tablet (0.4 mg) under your tongue for chest pain, every 5 min x3 doses. Call EMS on your second dose., Disp: 25 tablet, Rfl: 11   Omega-3 Fatty Acids (FISH OIL) 500 MG CAPS, Take 500 mg by mouth daily., Disp: , Rfl:    omeprazole (PRILOSEC) 20 MG capsule, Take 20 mg by mouth daily as needed (for acid reflux/indigestion)., Disp: , Rfl:    ondansetron (ZOFRAN-ODT) 4 MG disintegrating tablet, Dissolve 1 tablet (4 mg total) on top of the tongue then swallow 4 (four) times daily as needed., Disp: 30 tablet, Rfl: 0   predniSONE (DELTASONE) 10 MG tablet, TAKE 6 TABS BY MOUTH ON DAY 1; 5 TABS ON DAY 2; 4 TABS ON DAY 3; 3 TABS ON DAY 4; 2 TABS ON DAY 5; 1 TAB ON DAY 6 THEN STOP, Disp: 21 tablet, Rfl: 0   Respiratory Therapy Supplies (FLUTTER) DEVI, Use 3 times a day., Disp: 1 each, Rfl: 0   roflumilast (DALIRESP) 500 MCG TABS tablet, Take 1 tablet (500 mcg total) by mouth daily., Disp: 30 tablet, Rfl: 1   rosuvastatin (CRESTOR) 20 MG tablet, Take 1 tablet (20 mg total) by mouth at bedtime., Disp: 90 tablet, Rfl: 3   sertraline (ZOLOFT) 50 MG tablet, Take 1 tablet (50 mg total) by mouth daily., Disp: 90 tablet, Rfl: 3   Tiotropium Bromide Monohydrate (SPIRIVA RESPIMAT) 2.5 MCG/ACT AERS, 2 puffs, Disp: , Rfl:   Current Facility-Administered Medications:    levalbuterol (XOPENEX) nebulizer solution 0.63 mg, 0.63 mg, Nebulization, Q6H, [name], NP

## 2021-11-17 ENCOUNTER — Other Ambulatory Visit (HOSPITAL_COMMUNITY): Payer: Self-pay

## 2021-11-18 ENCOUNTER — Ambulatory Visit
Admission: RE | Admit: 2021-11-18 | Discharge: 2021-11-18 | Disposition: A | Discharge status: Home / Self Care | Payer: Medicare HMO | Source: Ambulatory Visit | Attending: Acute Care | Admitting: Acute Care

## 2021-11-18 DIAGNOSIS — F17210 Nicotine dependence, cigarettes, uncomplicated: Secondary | ICD-10-CM

## 2021-11-18 DIAGNOSIS — Z87891 Personal history of nicotine dependence: Secondary | ICD-10-CM

## 2021-11-18 DIAGNOSIS — R911 Solitary pulmonary nodule: Secondary | ICD-10-CM

## 2021-11-19 ENCOUNTER — Ambulatory Visit (INDEPENDENT_AMBULATORY_CARE_PROVIDER_SITE_OTHER): Payer: Medicare HMO | Admitting: Internal Medicine

## 2021-11-19 ENCOUNTER — Other Ambulatory Visit: Payer: Self-pay

## 2021-11-19 ENCOUNTER — Other Ambulatory Visit (HOSPITAL_COMMUNITY): Payer: Self-pay

## 2021-11-19 ENCOUNTER — Encounter: Payer: Self-pay | Admitting: Internal Medicine

## 2021-11-19 VITALS — BP 120/60 | HR 101 | Temp 98.2°F | Ht 66.5 in | Wt 165.2 lb

## 2021-11-19 DIAGNOSIS — J449 Chronic obstructive pulmonary disease, unspecified: Secondary | ICD-10-CM

## 2021-11-19 DIAGNOSIS — F17210 Nicotine dependence, cigarettes, uncomplicated: Secondary | ICD-10-CM | POA: Diagnosis not present

## 2021-11-19 DIAGNOSIS — Z87891 Personal history of nicotine dependence: Secondary | ICD-10-CM

## 2021-11-19 DIAGNOSIS — Z129 Encounter for screening for malignant neoplasm, site unspecified: Secondary | ICD-10-CM

## 2021-11-19 NOTE — Addendum Note (Signed)
Addended by: Lorretta Harp on: 11/19/2021 04:29 PM   Modules accepted: Orders

## 2021-11-19 NOTE — Patient Instructions (Addendum)
°  Stage 3 severe COPD by GOLD classification (HCC)  Stable without flare up  plan - continueTRELEGY once daily - continue ventolin as needed - use 3% saline neb as needed (you are using it 2 times per week) -  talk to Rob Hickman, NP and stop lisinopril first and later atenolol because they can increase cough/make copd worse - rest ONO on room air at night  Cigarette smoker - glad cut down to 2 cig per day  Plan  - quit asap  Lung cancer screening  -no recurrence Jan 2023  Plan -  Do followup  Low dose CT in Jan 2024  COPD  frequent exacerbations  Plan  - stop lisinopril (makes cough worse) - cotninue daliresp   Followup 6 months -do CAT score and simple walk test in offie at followu

## 2021-11-19 NOTE — Addendum Note (Signed)
Addended by: Wyvonne Lenz on: 11/19/2021 04:26 PM   Modules accepted: Orders

## 2021-11-19 NOTE — Progress Notes (Signed)
OV 07/14/2016  Chief Complaint  Patient presents with   Follow-up    9 month COPD follow up - reports breathing is near baseline with the change of seasons.    Fu copd - mod/severe with CT features of MAI early 2017 Fu smoking  Follows with ? Wife. Wants flu shot. Wants med refills. Cotninues to smoke; unable to quit. In feb 2017 says admitted for ae-copd and this scared him. Says now for months increased wheeze and yellow sputum and also cough with sinus drainage. He is off opioids. Does not want to go back to opioids due to addiction but cough is worse.    OV 11/23/2017  Chief Complaint  Patient presents with   Follow-up    Last seen 07/14/16. Pt states he has had a lot of congestion in head and chest, coughing with green mucus, and a lot of chest discomfort that has been happenng x5 days.  66 year old male with COPD not otherwise specified.  Features of MAI in 2017.  Active smoker  This is a routine visit.  Is on Spiriva and Symbicort which he says he is compliant with. He continues to smoke.  Has difficulty quitting.  He tells me that for the last 5 or 6 days he has had increased cough congestion wheezing yellow green sputum and severe shortness of breath.  There is no fever or chills.  No flu exposure.  COPD CAT score is documented below and significantly high.   06/25/2018 Patient presents today for acute visit with complaints of shortness of breath x1 week with rest and activity. Normally he is functional, maintained on symbicort 160 and Spiriva. Needs new neb machine, has had it for over 5 years. Reports that it quit working and there is a burning smell coming from it. Wants different DME company. BP is elevated today. He had a cigarette before today's visit and he did not take blood pressure medication. Still smoking, 3-4 cig a day (previous 2ppd). Treated for COPD exacerbation with doxycycline and prednisone taper. CXR showed COPD-reactive airway disease. Probable  subsegmental atelectasis or early pneumonia in the lingula. Followup PA and lateral chest X-ray is recommended in 3-4 weeks   07/09/2018 Patient returns for 1-2 week fu COPD exacerbation. Fatigue has improved. Still has congested cough, feels it mostly in his lung bases. States that he gets short of breath with stairs and walking to the bathroom. Using rescue inhaler more than normal, approx 4-5 times a days. Taking Robitussin-DM. States that this is not his baseline. No fever but sweating. Denies cp. Mucinex added. Extended prednisone and started on levaquin for COPD exacerbation. CXR repeated and showed no active process.   07/19/2018 Patient presents today for 10 fu regarding persistent COPD exacerbation. Patient reports that he is feeling a little better but still fatigues easily with activity. Continues to have congested cough. Has trouble getting mucus up when coughing which exacerbates his breathing. Takes Symbicort 160 and Spiriva as directed. Using albuterol inhaler every 4 hours. Continues mucinex twice daily and using flutter valve. Still smoking but has cutt back to less than 1/2 pack per day. Denies fever or chills.    OV 09/24/2018  Subjective:  Patient ID: LESS WOOLSEY, male , DOB: 1956-07-11 , age 33 y.o. , MRN: [id number] , ADDRESS: [address] [name] [address]   09/24/2018 -   Chief Complaint  Patient presents with   Follow-up    breathing same, little cough, SOB on exertion, little  chest tightness and wheezing     HPI Antony HasteKenneth P Morefield 66 y.o. -presents for follow-up of his advanced COPD.,  Smoking and follow-up of lung cancer screening  COPD advance: He had pulmonary function test today and it shows Gold stage III COPD with significant bronchodilator response.  He is on Spiriva and Symbicort.  Med review also shows that he is taking fish oil which might be causing acid reflux at night but he denies.  He is also on atenolol nonspecific beta-blocker.  He is  up-to-date with his flu shot.  He is willing to change to Trelegy inhaler  Smoking: He continues to smoke.  He says he smokes occasionally and only little.  However the entire room is smelling of tobacco.   Lung cancer screening: He had lung RADS-2 findings in February 2019.  His neck CT scan of the chest is set up for March 2020  On exam he was wheezing and then he admitted that he has slightly worsening symptoms than baseline of COPD with white and yellow sputum.  He thinks 5 days of prednisone would be okay although he is wary of taking longer prednisone because of weight gain and appetite change.  OV 08/19/2019  Subjective:  Patient ID: Antony HasteKenneth P Badger, male , DOB: 09/11/1956 , age 66 y.o. , MRN: [id number] , ADDRESS: [address] [place] [id number]   08/19/2019 -   Chief Complaint  Patient presents with   Follow-up    COPD. Using albuterol prn and Trelegy daily. Reports his breathing has been at his baseline. Has already had Flu shot.      HPI Antony HasteKenneth P Tarpley 66 y.o. -  Advanced copd follouw: last seen nov 2019 and then by app early 2020. boith times with aecopod. Siknce then no aecopd. On trelegy. Uses night o2 and portable o2 prn. Wants lighter portable system but does not want to wait for it. Uptodate with flu shot. No other issues. Continues to smoke. LDCT June 2020 without cancer.      IMPRESSION: Lung-RADS 2, benign appearance or behavior. Continue annual screening with low-dose chest CT without contrast in 12 months.   Aortic Atherosclerosis (ICD10-I70.0) and Emphysema (ICD10-J43.9).     Electronically Signed   By: Charline BillsSriyesh  Krishnan M.D.   On: 04/05/2019 13:20    OV 09/14/2020  Subjective:  Patient ID: Antony HasteKenneth P Mcculloh, male , DOB: 09/18/1956 , age 89 y.o. , MRN: [id number] , ADDRESS: [address] [place] [id number] PCP Deatra JamesSun, Vyvyan, MD Patient Care Team: Deatra JamesSun, Vyvyan, MD as PCP - General (Family Medicine)  This Provider for this visit:  Treatment Team:  Attending Provider: Kalman Shanamaswamy, Shakyia Bosso, MD    09/14/2020 -   Chief Complaint  Patient presents with   Follow-up    Breathing is unchanged since the last visit. He c/o increased cough for the past wk, esp at night- prod with yellow sputum. He is using his albuterol inhaler 4 x daily on average and neb with albuterol 3 x per wk.      HPI Antony HasteKenneth P Lindseth 66 y.o. -presents for follow-up.  Personally saw him over a year ago.  Since then he seen Buelah ManisBeth Walsh.  He is on Trelegy.  He uses oxygen at night and as needed based on subjective symptoms in the daytime.  He says when he goes shopping he will use his oxygen.  Otherwise he is easily within the car.  He thinks he is in a mild flareup with change in  color of sputum.  He thinks Z-Pak will suffice.  He does not want prednisone.  He says nurse practitioner gave him opioid cough syrup recently and he wants a refill of this.  Advised him that currently my fingerprint sensor with opiate prescriptions is not working.  Moreover with COPD better to treat exacerbations.  He has reluctantly agreed for that strategy.  His last CT scan of the chest was in July 2021.  His next CT scan should be in July 2022.  Reviewed his medications.  And noticed that he is not on Daliresp.  He says he is taking prednisone 2 times this year and he will be taking Z-Pak this time for COPD exacerbation which makes 3 exacerbations this year.  He is willing to try Roflumilast after I discussed this with him.  He continues to smoke.  He says he does not want any smoking aids.  We discussed this for 3 minutes.  He says he will quit smoking on his own.  He understands the importance to quit smoking.  He is up-to-date with his Covid vaccines.     IMPRESSION: CT chest 1. Lung-RADS 2, benign appearance or behavior. Continue annual screening with low-dose chest CT without contrast in 12 months. 2. Hepatic steatosis and cirrhosis. 3. Cholelithiasis. 4. Left  nephrolithiasis. 5. Aortic Atherosclerosis (ICD10-I70.0) and Emphysema (ICD10-J43.9). Coronary artery atherosclerosis.     Electronically Signed   By: Jeronimo Greaves M.D.   On: 05/20/2020 15:19  ROS - per HPI    OV 11/19/2021  Subjective:  Patient ID: Antony Haste, male , DOB: 12-08-1955 , age 66 y.o. , MRN: [id number] , ADDRESS: [address] [name] [address] PCP Hillery Aldo, NP Patient Care Team: Hillery Aldo, NP as PCP - General  This Provider for this visit: Treatment Team:  Attending Provider: Kalman Shan, MD    11/19/2021 -   Chief Complaint  Patient presents with   Follow-up    Pt states he has been doing okay since last visit and states his breathing is about the same.   Gold stage III COPD alpha-1 MM in 2016 Lung cancer screening Active smoker  HPI TATUM MASSMAN 66 y.o. -returns for follow-up.  I last saw him towards end of 2021.  After that have not seen him.  He says after he started Daliresp he has avoided flareups and has been doing well.  Last visit I told him to quit smoking.  Instead he is cut down to 2 cigarettes/day.  I congratulated him on his effort.  He says he will just quit smoking on his own does not want medication help.  From a respiratory standpoint he is stable on Trelegy and Daliresp.  There are no new symptoms.  The main issue is that he was using his dad's oxygen tank but the oxygen tank is not working anymore.  It was a concentrator for night.  He wants night and portable oxygen.  I told him we would need to do a walking desaturation test.  He refused saying 6 months ago it was normal and today somewhat tired.  I also advised him that he needed a test for night at home with the DME company.  He was wondering if he could just buy a portable machine.  I told him out-of-pocket it is over $5000.  Therefore he agreed to just get up DME testing for overnight oxygen study.  We will order this.  In terms of smoking: He has cut  down to 2 cigarettes/day.  He does not want help.  In terms of lung cancer screening he had CT scan of the chest low-dose today and there is no evidence of lung cancer.  Low risk scan.  Recommended 1 year follow-up     CAT Score 09/14/2020 11/19/2019  Total CAT Score 34 36       CT Chest data  CT CHEST LCS NODULE F/U W/O CONTRAST  Result Date: 11/18/2021 CLINICAL DATA:  Fifty-four pack-year smoking history. Current smoker. EXAM: CT CHEST WITHOUT CONTRAST FOR LUNG CANCER SCREENING NODULE FOLLOW-UP TECHNIQUE: Multidetector CT imaging of the chest was performed following the standard protocol without IV contrast. RADIATION DOSE REDUCTION: This exam was performed according to the departmental dose-optimization program which includes automated exposure control, adjustment of the mA and/or kV according to patient size and/or use of iterative reconstruction technique. COMPARISON:  06/30/2021 FINDINGS: Cardiovascular: Aortic atherosclerosis. Normal heart size, without pericardial effusion. Lad and left circumflex coronary artery calcification. Mediastinum/Nodes: No mediastinal or definite hilar adenopathy, given limitations of unenhanced CT. Lungs/Pleura: No pleural fluid. Moderate centrilobular emphysema. Similar lingular and right middle lobe scarring with volume loss. Subpleural lymph nodes along the left major fissure. The superior segment right lower lobe pulmonary nodule of concern has resolved. Other pulmonary nodules, including at maximally volume derived equivalent diameter 5.8 mm in the right lower lobe, are unchanged. Upper Abdomen: Multiple small gallstones. Advanced cirrhosis. Normal imaged portions of the spleen, stomach, pancreas, right adrenal gland. Mild left adrenal thickening. Normal imaged right kidney. Clustered upper pole left renal calculi including up to 1.1 cm. Musculoskeletal: Mid and lower thoracic vertebral body near complete fusion in the setting of diffuse idiopathic skeletal  hyperostosis. IMPRESSION: 1. Lung-RADS 2, benign appearance or behavior. Continue annual screening with low-dose chest CT without contrast in 12 months. 2. Cholelithiasis. 3. Left nephrolithiasis. 4. Cirrhosis 5. Aortic Atherosclerosis (ICD10-I70.0) and Emphysema (ICD10-J43.9). Coronary artery atherosclerosis. Electronically Signed   By: Jeronimo GreavesKyle  Talbot M.D.   On: 11/18/2021 16:09     PFT  PFT Results Latest Ref Rng & Units 09/24/2018  FVC-Pre L 2.18  FVC-Predicted Pre % 57  FVC-Post L 2.59  FVC-Predicted Post % 67  Pre FEV1/FVC % % 47  Post FEV1/FCV % % 53  FEV1-Pre L 1.03  FEV1-Predicted Pre % 35  FEV1-Post L 1.36  DLCO uncorrected ml/min/mmHg 13.12  DLCO UNC% % 52  DLVA Predicted % 61  TLC L 7.32  TLC % Predicted % 124  RV % Predicted % 225       has a past medical history of Anxiety, Arthritis, Asthma, Bipolar disorder (HCC), COPD (chronic obstructive pulmonary disease) (HCC), Depression, GERD (gastroesophageal reflux disease), Headache(784.0), Hiatal hernia, and Hypertension.   reports that he has been smoking cigarettes. He has a 144.00 pack-year smoking history. He has never used smokeless tobacco.  Past Surgical History:  Procedure Laterality Date   artheroscopic knee surgery R Right 01/2013    Allergies  Allergen Reactions   Codeine Nausea And Vomiting    Immunization History  Administered Date(s) Administered   Influenza Split 09/23/2017   Influenza, High Dose Seasonal PF 07/19/2018, 09/30/2021   Influenza, Quadrivalent, Recombinant, Inj, Pf 07/29/2019   Influenza,inj,Quad PF,6+ Mos 09/09/2015, 07/14/2016   Influenza-Unspecified 07/31/2014, 07/31/2020   PFIZER(Purple Top)SARS-COV-2 Vaccination 01/15/2020, 02/16/2020, 08/14/2020   Pneumococcal Conjugate-13 07/19/2018   Pneumococcal Polysaccharide-23 10/31/2013, 07/19/2018    Family History  Problem Relation Age of Onset   Heart disease Father  MI at age 55   Asthma Sister      Current Outpatient  Medications:    albuterol (PROVENTIL) (2.5 MG/3ML) 0.083% nebulizer solution, Take 3 mLs (2.5 mg total) by nebulization every 6 (six) hours as needed. For shortness of breath, Disp: 75 mL, Rfl: 5   albuterol (VENTOLIN HFA) 108 (90 Base) MCG/ACT inhaler, Inhale 2 puffs into the lungs every 4 (four) hours as needed for shortness of breath, Disp: 18 g, Rfl: 0   ALPRAZolam (XANAX) 0.5 MG tablet, Take 1 tablet (0.5 mg total) by mouth 3 (three) times daily as needed for panic attacks, Disp: 90 tablet, Rfl: 3   amLODipine (NORVASC) 5 MG tablet, Take 1 tablet by mouth daily., Disp: , Rfl:    aspirin EC 81 MG tablet, Take 1 tablet (81 mg total) by mouth daily., Disp: , Rfl:    atenolol-chlorthalidone (TENORETIC) 100-25 MG tablet, Take 1 tablet by mouth daily., Disp: , Rfl:    atorvastatin (LIPITOR) 80 MG tablet, TAKE 1 TABLET (80 MG) BY MOUTH ONCE DAILY, Disp: 90 tablet, Rfl: 3   carvedilol (COREG) 3.125 MG tablet, Take 1 tablet (3.125 mg total) by mouth 2 (two) times daily with food, Disp: 180 tablet, Rfl: 3   fluticasone (FLONASE) 50 MCG/ACT nasal spray, USE 2 SPRAYS (100 MCG) IN EACH NOSTRIL BY INTRANASAL ROUTE ONCE DAILY FOR 30 DAYS, Disp: 16 g, Rfl: 3   Fluticasone-Umeclidin-Vilant (TRELEGY ELLIPTA) 100-62.5-25 MCG/ACT AEPB, Inhale 1 puff into the lungs daily., Disp: 60 each, Rfl: 6   gabapentin (NEURONTIN) 300 MG capsule, TAKE 1 CAPSULE (300 MG) BY MOUTH 3 TIMES PER DAY FOR 90 DAYS, Disp: 270 capsule, Rfl: 3   guaiFENesin (MUCINEX) 600 MG 12 hr tablet, TAKE 1 TABLET (600 MG) BY MOUTH EVERY 12 HOURS FOR 30 DAYS, Disp: 60 tablet, Rfl: 0   lisinopril (ZESTRIL) 5 MG tablet, Take 1 tablet (5 mg total) by mouth daily., Disp: 90 tablet, Rfl: 3   loratadine (CLARITIN) 10 MG tablet, TAKE 1 TABLET (10 MG) BY MOUTH ONCE DAILY FOR 30 DAYS, Disp: 30 tablet, Rfl: 3   nitroGLYCERIN (NITROSTAT) 0.4 MG SL tablet, Place 1 tablet (0.4 mg) under your tongue for chest pain, every 5 min x3 doses. Call EMS on your second  dose., Disp: 25 tablet, Rfl: 11   Respiratory Therapy Supplies (FLUTTER) DEVI, Use 3 times a day., Disp: 1 each, Rfl: 0   roflumilast (DALIRESP) 500 MCG TABS tablet, Take 1 tablet (500 mcg total) by mouth daily., Disp: 30 tablet, Rfl: 1   rosuvastatin (CRESTOR) 20 MG tablet, Take 1 tablet (20 mg total) by mouth at bedtime., Disp: 90 tablet, Rfl: 3   sertraline (ZOLOFT) 50 MG tablet, Take 1 tablet (50 mg total) by mouth daily., Disp: 90 tablet, Rfl: 3  Current Facility-Administered Medications:    levalbuterol (XOPENEX) nebulizer solution 0.63 mg, 0.63 mg, Nebulization, Q6H, [name], NP      Objective:   Vitals:   11/19/21 1545  BP: 120/60  Pulse: (!) 101  Temp: 98.2 F (36.8 C)  TempSrc: Oral  SpO2: 93%  Weight: 165 lb 3.2 oz (74.9 kg)  Height: 5' 6.5" (1.689 m)    Estimated body mass index is 26.26 kg/m as calculated from the following:   Height as of this encounter: 5' 6.5" (1.689 m).   Weight as of this encounter: 165 lb 3.2 oz (74.9 kg).  @WEIGHTCHANGE @  [employer]   11/19/21 1545  Weight: 165 lb 3.2 oz (74.9 kg)  Physical Exam  General: No distress. Looks well. Smells tobacco Neuro: Alert and Oriented x 3. GCS 15. Speech normal Psych: Pleasant Resp:  Barrel Chest - no.  Wheeze - no, Crackles - no, No overt respiratory distress CVS: Normal heart sounds. Murmurs - no Ext: Stigmata of Connective Tissue Disease - no HEENT: Normal upper airway. PEERL +. No post nasal drip        Assessment:       ICD-10-CM   1. Stage 3 severe COPD by GOLD classification (HCC)  J44.9     2. Cigarette smoker  F17.210     3. Moderate cigarette smoker (10-19 per day)  F17.210     4. Cancer screening  Z12.9          Plan:     Patient Instructions   Stage 3 severe COPD by GOLD classification (HCC)  Stable without flare up  plan - continueTRELEGY once daily - continue ventolin as needed - use 3% saline neb as needed (you are using it 2 times per  week) -  talk to Rob Hickman, NP and stop lisinopril first and later atenolol because they can increase cough/make copd worse - rest ONO on room air at night  Cigarette smoker - glad cut down to 2 cig per day  Plan  - quit asap  Lung cancer screening  -no recurrence Jan 2023  Plan -  Do followup  Low dose CT in Jan 2024  COPD  frequent exacerbations  Plan  - stop lisinopril (makes cough worse) - cotninue daliresp   Followup 6 months -do CAT score and simple walk test in offie at followu    SIGNATURE    Dr. Kalman Shan, M.D., F.C.C.P,  Pulmonary and Critical Care Medicine Staff Physician, [facility] Director - Interstitial Lung Disease  Program  Pulmonary Fibrosis [facility] [place], [name], [address]  Pager: [phone], If no answer or between  15:00h - 7:00h: call [phone] Telephone: [phone]  4:16 PM 11/19/2021

## 2021-11-23 ENCOUNTER — Other Ambulatory Visit (HOSPITAL_COMMUNITY): Payer: Self-pay

## 2021-11-24 ENCOUNTER — Other Ambulatory Visit (HOSPITAL_COMMUNITY): Payer: Self-pay

## 2021-11-25 ENCOUNTER — Other Ambulatory Visit (HOSPITAL_COMMUNITY): Payer: Self-pay

## 2021-11-29 ENCOUNTER — Other Ambulatory Visit (HOSPITAL_COMMUNITY): Payer: Self-pay

## 2021-11-30 ENCOUNTER — Telehealth: Payer: Self-pay | Admitting: Internal Medicine

## 2021-11-30 DIAGNOSIS — J9611 Chronic respiratory failure with hypoxia: Secondary | ICD-10-CM

## 2021-11-30 DIAGNOSIS — J438 Other emphysema: Secondary | ICD-10-CM

## 2021-11-30 NOTE — Telephone Encounter (Signed)
Overnight pulse oximetry study done on room air for Glen Lopez on 11/24/2021: Time spent less than or equal to 80% was for 338 minutes.  Bradycardia time was 6.3 minutes.  Plan - Start 2 L nasal cannula

## 2021-12-02 NOTE — Telephone Encounter (Signed)
Called and spoke with pt letting him know the results of the ONO and that it does qualify him for O2. Pt verbalized understanding. Order has been placed. Will get MR to sign off on the order this afternoon 2/2 when he is in clinic. Nothing further needed.

## 2021-12-03 ENCOUNTER — Other Ambulatory Visit: Payer: Self-pay

## 2021-12-03 MED ORDER — ATENOLOL-CHLORTHALIDONE 100-25 MG PO TABS
1.0000 | ORAL_TABLET | Freq: Every day | ORAL | 11 refills | Status: DC
  Filled 2021-12-03: qty 30, 30d supply, fill #0
  Filled 2022-01-03: qty 30, 30d supply, fill #1

## 2021-12-03 MED ORDER — ATORVASTATIN CALCIUM 80 MG PO TABS
ORAL_TABLET | ORAL | 11 refills | Status: AC
  Filled 2021-12-03: qty 30, 30d supply, fill #0
  Filled 2022-01-03: qty 30, 30d supply, fill #1
  Filled 2022-03-11 – 2022-03-14 (×2): qty 30, 30d supply, fill #2
  Filled 2022-03-14: qty 30, 30d supply, fill #3

## 2021-12-06 ENCOUNTER — Other Ambulatory Visit: Payer: Self-pay

## 2021-12-06 ENCOUNTER — Other Ambulatory Visit (HOSPITAL_COMMUNITY): Payer: Self-pay

## 2021-12-07 ENCOUNTER — Other Ambulatory Visit (HOSPITAL_COMMUNITY): Payer: Self-pay

## 2021-12-16 ENCOUNTER — Other Ambulatory Visit (HOSPITAL_COMMUNITY): Payer: Self-pay

## 2021-12-17 ENCOUNTER — Other Ambulatory Visit (HOSPITAL_COMMUNITY): Payer: Self-pay

## 2021-12-17 MED ORDER — LISINOPRIL 5 MG PO TABS
5.0000 mg | ORAL_TABLET | Freq: Every day | ORAL | 3 refills | Status: AC
  Filled 2021-12-17: qty 90, 90d supply, fill #0

## 2021-12-20 ENCOUNTER — Other Ambulatory Visit (HOSPITAL_COMMUNITY): Payer: Self-pay

## 2021-12-28 ENCOUNTER — Other Ambulatory Visit (HOSPITAL_COMMUNITY): Payer: Self-pay

## 2021-12-29 ENCOUNTER — Other Ambulatory Visit (HOSPITAL_COMMUNITY): Payer: Self-pay

## 2021-12-30 ENCOUNTER — Other Ambulatory Visit: Payer: Self-pay | Admitting: Internal Medicine

## 2021-12-30 ENCOUNTER — Other Ambulatory Visit (HOSPITAL_COMMUNITY): Payer: Self-pay

## 2021-12-30 ENCOUNTER — Telehealth: Payer: Self-pay | Admitting: Internal Medicine

## 2021-12-30 DIAGNOSIS — J441 Chronic obstructive pulmonary disease with (acute) exacerbation: Secondary | ICD-10-CM

## 2021-12-30 MED ORDER — ROFLUMILAST 500 MCG PO TABS: 500.0000 ug | ORAL_TABLET | Freq: Every day | ORAL | 2 refills | Status: DC

## 2021-12-30 NOTE — Telephone Encounter (Signed)
Called and confirmed pharmacy patient would like medications sent to. No questions noted from patient. Nothing further needed  ?

## 2021-12-30 NOTE — Telephone Encounter (Signed)
appt needed

## 2022-01-03 ENCOUNTER — Other Ambulatory Visit: Payer: Self-pay

## 2022-01-03 ENCOUNTER — Other Ambulatory Visit (HOSPITAL_COMMUNITY): Payer: Self-pay

## 2022-01-11 ENCOUNTER — Telehealth: Payer: Self-pay | Admitting: Internal Medicine

## 2022-01-11 NOTE — Telephone Encounter (Signed)
Spoke to patient.  ?C/o prod cough with clear to brown sputum, chest congestion and increased sob with laying flat x5-6d.  ?Denied f/c/s or additional sx.  ?No current abx, prednisone or OTC meds ?No recent covid or flu test.  ?Fully vaccinated against covid and flu.  ?He is using albuterol solution BID, Trelegy once daily, albuterol HFA BID and Daliresp daily. ? ?MR, please advise. Thanks ?

## 2022-01-11 NOTE — Telephone Encounter (Signed)
Lm x1 for patient.  

## 2022-01-11 NOTE — Telephone Encounter (Signed)
Diagnosis COPD exacerbation ? ?Plan ?Take doxycycline 100mg  po twice daily x 5 days; take after meals and avoid sunlight ? ?Please take prednisone 40 mg x1 day, then 30 mg x1 day, then 20 mg x1 day, then 10 mg x1 day, and then 5 mg x1 day and stop ? ?Please let him know there is a research study coming up with an injection to prevent flareups and we might contact him if he qualifies ? ?Allergies  ?Allergen Reactions  ? Codeine Nausea And Vomiting  ? ? ?

## 2022-01-12 ENCOUNTER — Other Ambulatory Visit (HOSPITAL_COMMUNITY): Payer: Self-pay

## 2022-01-12 MED ORDER — DOXYCYCLINE HYCLATE 100 MG PO TABS: 100.0000 mg | ORAL_TABLET | Freq: Two times a day (BID) | ORAL | 0 refills | Status: DC

## 2022-01-12 MED ORDER — PREDNISONE 10 MG PO TABS: ORAL_TABLET | ORAL | 0 refills | Status: DC

## 2022-01-12 NOTE — Telephone Encounter (Signed)
Patient is aware of recommendations and voiced his understanding.  ?Doxy and prescribed sent to preferred pharmacy.  ?Nothing further needed.  ? ?

## 2022-01-13 ENCOUNTER — Other Ambulatory Visit (HOSPITAL_COMMUNITY): Payer: Self-pay

## 2022-01-20 ENCOUNTER — Other Ambulatory Visit: Payer: Self-pay | Admitting: Student

## 2022-01-20 ENCOUNTER — Other Ambulatory Visit (HOSPITAL_COMMUNITY): Payer: Self-pay

## 2022-01-20 ENCOUNTER — Other Ambulatory Visit: Payer: Medicare HMO

## 2022-01-20 DIAGNOSIS — W19XXXA Unspecified fall, initial encounter: Secondary | ICD-10-CM

## 2022-01-21 ENCOUNTER — Other Ambulatory Visit: Payer: Self-pay | Admitting: Student

## 2022-01-21 ENCOUNTER — Ambulatory Visit
Admission: RE | Admit: 2022-01-21 | Discharge: 2022-01-21 | Disposition: A | Discharge status: Home / Self Care | Payer: Medicare HMO | Source: Ambulatory Visit | Attending: Student | Admitting: Student

## 2022-01-21 DIAGNOSIS — W19XXXA Unspecified fall, initial encounter: Secondary | ICD-10-CM

## 2022-01-28 ENCOUNTER — Telehealth: Payer: Self-pay | Admitting: Internal Medicine

## 2022-01-28 NOTE — Telephone Encounter (Signed)
Will call [facility] to see if they got the order on Monday since it is after 5 p.m. now. ?

## 2022-02-02 NOTE — Telephone Encounter (Signed)
Returned call, spoke with Duwayne Heck, she stated that the ONO was done in January.  A form was faxed in February with the results that included a form that the patient was a candidate for cardiac monitory.  The form was signed and sent back on February 8th, however it did not have a diagnosis code.  I advised her that since this was sent in February and it is now April we would need the form faxed back to [phone] attention Dr. Marchelle Gearing and I would have his CMA be on the look out for the form. ? ?Irving Burton, ?Duwayne Heck is faxing a form for Dr. Marchelle Gearing for this patient and it needs a diagnosis code.  Please be on the look out for it.  Thank you. ?

## 2022-02-02 NOTE — Telephone Encounter (Signed)
French Ana from [employer] needs diagnosis code. French Ana phone number is [phone]. ?

## 2022-02-17 ENCOUNTER — Other Ambulatory Visit (HOSPITAL_COMMUNITY): Payer: Self-pay

## 2022-02-25 LAB — COLOGUARD: COLOGUARD: POSITIVE — AB

## 2022-03-01 ENCOUNTER — Other Ambulatory Visit (HOSPITAL_COMMUNITY): Payer: Self-pay

## 2022-03-09 ENCOUNTER — Telehealth: Payer: Self-pay | Admitting: Internal Medicine

## 2022-03-09 MED ORDER — PREDNISONE 10 MG PO TABS: 40.0000 mg | ORAL_TABLET | Freq: Every day | ORAL | 0 refills | Status: AC

## 2022-03-09 MED ORDER — DOXYCYCLINE HYCLATE 100 MG PO TABS: 100.0000 mg | ORAL_TABLET | Freq: Two times a day (BID) | ORAL | 0 refills | Status: DC

## 2022-03-09 NOTE — Telephone Encounter (Signed)
Called and spoke with patient to let him know that we are going to send in 2 prescriptions and also that we needed to get him scheduled for an OV. Prescriptions have been sent to preferred pharmacy and he has been scheduled for OV. Nothing further needed at this time. ? ?Next Appt ?With Pulmonology [name], NP) ?03/22/2022 at 9:30 AM ?

## 2022-03-09 NOTE — Telephone Encounter (Signed)
Please ensure he is not having any fevers, malaise/fatigue, or weakness. If so, needs to come in for OV/CXR. Otherwise, please send doxycycline 1 tab Twice daily for 7 days. Take with food and wear sunscreen when outside. Prednisone burst 40 mg x 5 days. Take in AM with food. If no improvement or worsening symptoms develop, needs to be seen in person or seek emergency care.  ? ?This is his second exacerbation since March requiring prednisone and abx. He needs to be scheduled for follow up visit in the next few weeks. Thanks.

## 2022-03-09 NOTE — Telephone Encounter (Signed)
Called and spoke with patient who states he is having symptoms of cough and chest congestion that started day before yesterday. Patient states that his cough is productive with yellow sputum. Patient declined appointment. Would like something called into pharmacy. Pharmacy is [facility]  ? ? ?Please advise ?

## 2022-03-11 ENCOUNTER — Other Ambulatory Visit (HOSPITAL_COMMUNITY): Payer: Self-pay

## 2022-03-14 ENCOUNTER — Other Ambulatory Visit (HOSPITAL_COMMUNITY): Payer: Self-pay

## 2022-03-14 ENCOUNTER — Other Ambulatory Visit: Payer: Self-pay

## 2022-03-15 ENCOUNTER — Other Ambulatory Visit (HOSPITAL_COMMUNITY): Payer: Self-pay

## 2022-03-15 ENCOUNTER — Other Ambulatory Visit: Payer: Self-pay

## 2022-03-18 ENCOUNTER — Other Ambulatory Visit: Payer: Self-pay | Admitting: Internal Medicine

## 2022-03-18 DIAGNOSIS — J441 Chronic obstructive pulmonary disease with (acute) exacerbation: Secondary | ICD-10-CM

## 2022-03-19 ENCOUNTER — Other Ambulatory Visit: Payer: Self-pay | Admitting: Internal Medicine

## 2022-03-21 NOTE — Progress Notes (Unsigned)
@Patient  ID: , male    DOB: 01-13-1956, 66 y.o.   MRN: [id number]  No chief complaint on file.   Referring provider: [id number], NP  HPI: 66 year old male, current everyday smoker. PMH severe COPD, chronic resp failure with hypoxia, HTN, tobacco use, post nasal drip. Patient of Dr. 76. Maintained on Trelegy 100 1 puff daily, prn ventolin and hypertonic saline nebulizer as needed. Continue to encourage complete smoking cessation. Following with lung cancer screening clinic.    03/22/2022 Patient called our office on 03/09/21 for cough. This is his second exacerbation since March requiring prednisone and antibiotics. Sent in doxycycline and prednisone 40mg  x 5 days.    Allergies  Allergen Reactions   Codeine Nausea And Vomiting    Immunization History  Administered Date(s) Administered   Influenza Split 09/23/2017   Influenza, High Dose Seasonal PF 07/19/2018, 09/30/2021   Influenza, Quadrivalent, Recombinant, Inj, Pf 07/29/2019   Influenza,inj,Quad PF,6+ Mos 09/09/2015, 07/14/2016   Influenza-Unspecified 07/31/2014, 07/31/2020   PFIZER(Purple Top)SARS-COV-2 Vaccination 01/15/2020, 02/16/2020, 08/14/2020   Pneumococcal Conjugate-13 07/19/2018   Pneumococcal Polysaccharide-23 10/31/2013, 07/19/2018    Past Medical History:  Diagnosis Date   Anxiety    Arthritis    Asthma    Bipolar disorder (HCC)    COPD (chronic obstructive pulmonary disease) (HCC)    Depression    GERD (gastroesophageal reflux disease)    Headache(784.0)    Hiatal hernia    Hypertension     Tobacco History: Social History   Tobacco Use  Smoking Status Every Day   Packs/day: 3.00   Years: 48.00   Pack years: 144.00   Types: Cigarettes  Smokeless Tobacco Never  Tobacco Comments   2-3 cigs per day/  ep   Ready to quit: Not Answered Counseling given: Not Answered Tobacco comments: 2-3 cigs per day/  ep   Outpatient Medications Prior to Visit  Medication Sig  Dispense Refill   albuterol (PROVENTIL) (2.5 MG/3ML) 0.083% nebulizer solution Take 3 mLs (2.5 mg total) by nebulization every 6 (six) hours as needed. For shortness of breath 75 mL 5   albuterol (VENTOLIN HFA) 108 (90 Base) MCG/ACT inhaler Inhale 2 puffs into the lungs every 4 (four) hours as needed for shortness of breath 18 g 0   ALPRAZolam (XANAX) 0.5 MG tablet Take 1 tablet (0.5 mg total) by mouth 3 (three) times daily as needed for panic attacks 90 tablet 3   amLODipine (NORVASC) 5 MG tablet Take 1 tablet by mouth daily.     aspirin EC 81 MG tablet Take 1 tablet (81 mg total) by mouth daily.     atenolol-chlorthalidone (TENORETIC) 100-25 MG tablet Take 1 tablet by mouth daily.     atenolol-chlorthalidone (TENORETIC) 100-25 MG tablet Take 1 tablet by mouth daily. 30 tablet 11   atorvastatin (LIPITOR) 80 MG tablet TAKE 1 TABLET (80 MG) BY MOUTH ONCE DAILY 90 tablet 3   atorvastatin (LIPITOR) 80 MG tablet Take 1 tablet (80 mg total) by mouth daily at 6pm. 30 tablet 11   carvedilol (COREG) 3.125 MG tablet Take 1 tablet (3.125 mg total) by mouth 2 (two) times daily with food 180 tablet 3   doxycycline (VIBRA-TABS) 100 MG tablet Take 1 tablet (100 mg total) by mouth 2 (two) times daily. 10 tablet 0   fluticasone (FLONASE) 50 MCG/ACT nasal spray USE 2 SPRAYS (100 MCG) IN EACH NOSTRIL BY INTRANASAL ROUTE ONCE DAILY FOR 30 DAYS 16 g 3   Fluticasone-Umeclidin-Vilant (TRELEGY ELLIPTA)  100-62.5-25 MCG/ACT AEPB Inhale 1 puff into the lungs daily. 60 each 6   gabapentin (NEURONTIN) 300 MG capsule TAKE 1 CAPSULE (300 MG) BY MOUTH 3 TIMES PER DAY FOR 90 DAYS 270 capsule 3   lisinopril (ZESTRIL) 5 MG tablet Take 1 tablet (5 mg total) by mouth daily. 90 tablet 3   loratadine (CLARITIN) 10 MG tablet TAKE 1 TABLET (10 MG) BY MOUTH ONCE DAILY FOR 30 DAYS 30 tablet 3   nitroGLYCERIN (NITROSTAT) 0.4 MG SL tablet Place 1 tablet (0.4 mg) under your tongue for chest pain, every 5 min x3 doses. Call EMS on your second  dose. 25 tablet 11   Respiratory Therapy Supplies (FLUTTER) DEVI Use 3 times a day. 1 each 0   roflumilast (DALIRESP) 500 MCG TABS tablet TAKE 1 TABLET(500 MCG) BY MOUTH DAILY 30 tablet 2   rosuvastatin (CRESTOR) 20 MG tablet Take 1 tablet (20 mg total) by mouth at bedtime. 90 tablet 3   sertraline (ZOLOFT) 50 MG tablet Take 1 tablet (50 mg total) by mouth daily. 90 tablet 3   Facility-Administered Medications Prior to Visit  Medication Dose Route Frequency Provider Last Rate Last Admin   levalbuterol (XOPENEX) nebulizer solution 0.63 mg  0.63 mg Nebulization Q6H [name], NP          Review of Systems  Review of Systems   Physical Exam  There were no vitals taken for this visit. Physical Exam   Lab Results:  CBC    Component Value Date/Time   WBC 16.3 (H) 12/09/2015 0557   RBC 4.38 12/09/2015 0557   HGB 14.0 12/09/2015 0557   HCT 43.6 12/09/2015 0557   PLT 111 (L) 12/09/2015 0557   MCV 99.5 12/09/2015 0557   MCH 32.0 12/09/2015 0557   MCHC 32.1 12/09/2015 0557   RDW 15.2 12/09/2015 0557   LYMPHSABS 0.7 12/08/2015 0551   MONOABS 0.3 12/08/2015 0551   EOSABS 0.0 12/08/2015 0551   BASOSABS 0.0 12/08/2015 0551    BMET    Component Value Date/Time   NA 139 12/09/2015 0557   K 4.7 12/09/2015 0557   CL 109 12/09/2015 0557   CO2 24 12/09/2015 0557   GLUCOSE 176 (H) 12/09/2015 0557   BUN 17 12/09/2015 0557   CREATININE 0.94 12/09/2015 0557   CALCIUM 8.4 (L) 12/09/2015 0557   GFRNONAA >60 12/09/2015 0557   GFRAA >60 12/09/2015 0557    BNP No results found for: BNP  ProBNP    Component Value Date/Time   PROBNP 125.2 (H) 10/31/2013 1645    Imaging: No results found.   Assessment & Plan:   No problem-specific Assessment & Plan notes found for this encounter.     [name], NP 03/21/2022

## 2022-03-22 ENCOUNTER — Encounter: Payer: Self-pay | Admitting: Primary Care

## 2022-03-22 ENCOUNTER — Ambulatory Visit (INDEPENDENT_AMBULATORY_CARE_PROVIDER_SITE_OTHER): Payer: Medicare HMO

## 2022-03-22 ENCOUNTER — Ambulatory Visit (INDEPENDENT_AMBULATORY_CARE_PROVIDER_SITE_OTHER): Payer: Medicare HMO | Admitting: Primary Care

## 2022-03-22 DIAGNOSIS — J441 Chronic obstructive pulmonary disease with (acute) exacerbation: Secondary | ICD-10-CM

## 2022-03-22 MED ORDER — AZELASTINE HCL 0.1 % NA SOLN: 1.0000 | Freq: Two times a day (BID) | NASAL | 1 refills | Status: DC

## 2022-03-22 MED ORDER — TRELEGY ELLIPTA 200-62.5-25 MCG/ACT IN AEPB: 1.0000 | INHALATION_SPRAY | Freq: Every day | RESPIRATORY_TRACT | 0 refills | Status: DC

## 2022-03-22 MED ORDER — FLUTICASONE PROPIONATE 50 MCG/ACT NA SUSP
NASAL | 3 refills | Status: DC
Start: 2022-03-22 — End: 2022-06-20

## 2022-03-22 MED ORDER — FAMOTIDINE 20 MG PO TABS: 20.0000 mg | ORAL_TABLET | Freq: Every day | ORAL | 5 refills | Status: DC

## 2022-03-22 MED ORDER — ROFLUMILAST 500 MCG PO TABS: ORAL_TABLET | ORAL | 2 refills | Status: DC

## 2022-03-22 NOTE — Assessment & Plan Note (Signed)
-   Patient's has had 2 exacerbations since March 2023.  Complaint is sputum production at night.  He completed course of doxycycline and prednisone with improvement in symptoms but not 100% resolved.  He is currently taking Trelegy 100 and Daliresp 500 mcg daily.  Checking chest x-ray today.  Advised patient resume hypertonic saline nebulizers and Mucinex twice daily.  We will also get chest x-ray.  Plan increasing Trelegy to 200 mcg daily and starting famotidine and Astelin to treat for underlying GERD and postnasal drip which could be exacerbating cough at night.  Follow-up in 2 weeks with Waynetta Sandy NP

## 2022-03-22 NOTE — Progress Notes (Signed)
CXR without acute findings, lungs hyperinflated. No focal consolidation, pleural effusion or vascular congestion. No changes from plan today.

## 2022-03-22 NOTE — Patient Instructions (Addendum)
Recommendations: Resume hypertonic saline neb twice a day Take mucinex 600mg  twicce daily Start astelin nasal spray at bedtime Start famotidine at bedtime  Start Trelegy one puff daily in the morning x 2 weeks (then resume ) Continue Daliresp   Orders: CXR   Rx; Astelin nasal spray Famotidine   Follow-up: 2 weeks with 

## 2022-03-25 ENCOUNTER — Other Ambulatory Visit: Payer: Self-pay | Admitting: Gastroenterology

## 2022-03-25 ENCOUNTER — Other Ambulatory Visit (HOSPITAL_COMMUNITY): Payer: Self-pay

## 2022-03-25 DIAGNOSIS — K7460 Unspecified cirrhosis of liver: Secondary | ICD-10-CM

## 2022-03-31 ENCOUNTER — Other Ambulatory Visit: Payer: Self-pay

## 2022-03-31 ENCOUNTER — Other Ambulatory Visit (HOSPITAL_COMMUNITY): Payer: Self-pay

## 2022-04-04 ENCOUNTER — Ambulatory Visit
Admission: RE | Admit: 2022-04-04 | Discharge: 2022-04-04 | Disposition: A | Discharge status: Home / Self Care | Payer: Medicare HMO | Source: Ambulatory Visit | Attending: Gastroenterology | Admitting: Gastroenterology

## 2022-04-04 DIAGNOSIS — K7460 Unspecified cirrhosis of liver: Secondary | ICD-10-CM

## 2022-04-05 ENCOUNTER — Ambulatory Visit: Payer: Medicare HMO | Admitting: Primary Care

## 2022-04-21 ENCOUNTER — Ambulatory Visit (INDEPENDENT_AMBULATORY_CARE_PROVIDER_SITE_OTHER): Payer: Medicare HMO | Admitting: Primary Care

## 2022-04-21 ENCOUNTER — Encounter: Payer: Self-pay | Admitting: Primary Care

## 2022-04-21 DIAGNOSIS — Z720 Tobacco use: Secondary | ICD-10-CM

## 2022-04-21 DIAGNOSIS — J9611 Chronic respiratory failure with hypoxia: Secondary | ICD-10-CM

## 2022-04-21 DIAGNOSIS — R0982 Postnasal drip: Secondary | ICD-10-CM

## 2022-04-21 DIAGNOSIS — J438 Other emphysema: Secondary | ICD-10-CM

## 2022-04-21 DIAGNOSIS — K219 Gastro-esophageal reflux disease without esophagitis: Secondary | ICD-10-CM | POA: Diagnosis not present

## 2022-04-21 DIAGNOSIS — F17210 Nicotine dependence, cigarettes, uncomplicated: Secondary | ICD-10-CM

## 2022-04-21 MED ORDER — TRELEGY ELLIPTA 200-62.5-25 MCG/ACT IN AEPB: 1.0000 | INHALATION_SPRAY | Freq: Every day | RESPIRATORY_TRACT | 5 refills | Status: DC

## 2022-04-21 NOTE — Assessment & Plan Note (Signed)
-   Nocturnal cough improved with addition of Famotidine at bedtime

## 2022-04-21 NOTE — Progress Notes (Signed)
@Patient  ID: , male    DOB: 1956/08/09, 66 y.o.   MRN: [id number]  Chief Complaint  Patient presents with   Follow-up    Pt states that he is better compared to last visit.    Referring provider: [id number], NP  HPI: 66 year old male, current everyday smoker. PMH severe COPD, chronic resp failure with hypoxia, HTN, tobacco use, post nasal drip. Patient of Dr. 76. Maintained on Trelegy 100 1 puff daily, prn ventolin and hypertonic saline nebulizer as needed. Continue to encourage complete smoking cessation. Following with lung cancer screening clinic.   Previous [facility] pulmonary encounter: 03/22/2022 Patient was last seen in January 2023 and was doing well. At that time he was on Trelegy, albuterol prn, hypertonic saline BID, Daliresp, reported less flare ups. Patient called our office on 03/09/21 for cough. This is his second exacerbation since March requiring prednisone and antibiotics. Sent in doxycycline and prednisone 40mg  x 5 days.   He is feeling some better but having a lot of mucus production at night. He describes mucus as fluid like which he feels is coming from his chest and makes him hoarse. He has some associated nasal drainage.  He is not taking reflux medication or over-the-counter nasal sprays.  He is compliant with Trelegy 100 and Daliresp 500 mcg daily.  He uses albuterol nebulizer 1-2 times daily.  He is not currently using hypertonic saline nebulizer or taking Mucinex. He uses flutter valve daily.  He continues to smoke 3 to 4 cigarettes a day, he is actively trying to cut back and quit.  04/21/2022 Patient presents today for 2-week follow-up/COPD exacerbation. He has had 2 exacerbations since March. His main complaint is sputum production at night. He completed course of doxycyline and prednisone but did not see 100% improvement in his symptoms. He is maintained on Trelegy 04/23/2022 and Daliresp April. Recommended starting hypertonic saline nebs and  mucinex. We also increased Trelegy dose to and started him on famotidine and astelin nasal spray for underlying GERD/ PND which felt to be exacerbating his cough at night.   He is feeling better. He reports less congestion. No significant cough at night. Baseline dyspnea. He walks his dog/exercise twice daily, feels breathing has improved the last year since doing this. CAT 21   Allergies  Allergen Reactions   Codeine Nausea And Vomiting    Immunization History  Administered Date(s) Administered   Influenza Split 09/23/2017   Influenza, High Dose Seasonal PF 07/19/2018, 09/30/2021   Influenza, Quadrivalent, Recombinant, Inj, Pf 07/29/2019   Influenza,inj,Quad PF,6+ Mos 09/09/2015, 07/14/2016   Influenza-Unspecified 07/31/2014, 07/31/2020   PFIZER(Purple Top)SARS-COV-2 Vaccination 01/15/2020, 02/16/2020, 08/14/2020   Pneumococcal Conjugate-13 07/19/2018   Pneumococcal Polysaccharide-23 10/31/2013, 07/19/2018    Past Medical History:  Diagnosis Date   Anxiety    Arthritis    Asthma    Bipolar disorder (HCC)    COPD (chronic obstructive pulmonary disease) (HCC)    Depression    GERD (gastroesophageal reflux disease)    Headache(784.0)    Hiatal hernia    Hypertension     Tobacco History: Social History   Tobacco Use  Smoking Status Every Day   Packs/day: 3.00   Years: 48.00   Total pack years: 144.00   Types: Cigarettes  Smokeless Tobacco Never  Tobacco Comments   3-4 cigs per day as of 04/21/22 ep   Ready to quit: Not Answered Counseling given: Not Answered Tobacco comments: 3-4 cigs per day as of 04/21/22  ep   Outpatient Medications Prior to Visit  Medication Sig Dispense Refill   albuterol (PROVENTIL) (2.5 MG/3ML) 0.083% nebulizer solution Take 3 mLs (2.5 mg total) by nebulization every 6 (six) hours as needed. For shortness of breath 75 mL 5   albuterol (VENTOLIN HFA) 108 (90 Base) MCG/ACT inhaler Inhale 2 puffs into the lungs every 4 (four) hours as  needed for shortness of breath 18 g 0   ALPRAZolam (XANAX) 0.5 MG tablet Take 1 tablet (0.5 mg total) by mouth 3 (three) times daily as needed for panic attacks 90 tablet 3   amLODipine (NORVASC) 5 MG tablet Take 1 tablet by mouth daily.     aspirin EC 81 MG tablet Take 1 tablet (81 mg total) by mouth daily.     atenolol-chlorthalidone (TENORETIC) 100-25 MG tablet Take 1 tablet by mouth daily. 30 tablet 11   atorvastatin (LIPITOR) 80 MG tablet Take 1 tablet (80 mg total) by mouth daily at 6pm. 30 tablet 11   azelastine (ASTELIN) 0.1 % nasal spray Place 1 spray into both nostrils 2 (two) times daily. Use in each nostril as directed 30 mL 1   carvedilol (COREG) 3.125 MG tablet Take 1 tablet (3.125 mg total) by mouth 2 (two) times daily with food 180 tablet 3   famotidine (PEPCID) 20 MG tablet Take 1 tablet (20 mg total) by mouth at bedtime. 30 tablet 5   fluticasone (FLONASE) 50 MCG/ACT nasal spray USE 2 SPRAYS (100 MCG) IN EACH NOSTRIL BY INTRANASAL ROUTE ONCE DAILY FOR 30 DAYS 16 g 3   Fluticasone-Umeclidin-Vilant (TRELEGY ELLIPTA) 200-62.5-25 MCG/ACT AEPB Inhale 1 puff into the lungs daily. 1 each 0   lisinopril (ZESTRIL) 5 MG tablet Take 1 tablet (5 mg total) by mouth daily. 90 tablet 3   nitroGLYCERIN (NITROSTAT) 0.4 MG SL tablet Place 1 tablet (0.4 mg) under your tongue for chest pain, every 5 min x3 doses. Call EMS on your second dose. 25 tablet 11   Respiratory Therapy Supplies (FLUTTER) DEVI Use 3 times a day. 1 each 0   roflumilast (DALIRESP) 500 MCG TABS tablet TAKE 1 TABLET(500 MCG) BY MOUTH DAILY 30 tablet 2   rosuvastatin (CRESTOR) 20 MG tablet Take 1 tablet (20 mg total) by mouth at bedtime. 90 tablet 3   sertraline (ZOLOFT) 50 MG tablet Take 1 tablet (50 mg total) by mouth daily. 90 tablet 3   gabapentin (NEURONTIN) 300 MG capsule TAKE 1 CAPSULE (300 MG) BY MOUTH 3 TIMES PER DAY FOR 90 DAYS 270 capsule 3   loratadine (CLARITIN) 10 MG tablet TAKE 1 TABLET (10 MG) BY MOUTH ONCE DAILY  FOR 30 DAYS 30 tablet 3   atenolol-chlorthalidone (TENORETIC) 100-25 MG tablet Take 1 tablet by mouth daily.     atorvastatin (LIPITOR) 80 MG tablet TAKE 1 TABLET (80 MG) BY MOUTH ONCE DAILY 90 tablet 3   Facility-Administered Medications Prior to Visit  Medication Dose Route Frequency Provider Last Rate Last Admin   levalbuterol (XOPENEX) nebulizer solution 0.63 mg  0.63 mg Nebulization Q6H [name], NP          Review of Systems  Review of Systems  Constitutional: Negative.   HENT:  Positive for congestion.   Respiratory:  Negative for apnea, cough, shortness of breath and wheezing.   Cardiovascular: Negative.    Physical Exam  BP 132/64 (BP Location: Right Arm, Patient Position: Sitting, Cuff Size: Normal)   Pulse 61   Temp (!) 97.5 F (36.4 C) (Oral)  Ht 5' 6.5" (1.689 m)   Wt 160 lb 9.6 oz (72.8 kg)   SpO2 98% Comment: RA  BMI 25.53 kg/m  Physical Exam Constitutional:      Appearance: Normal appearance.  HENT:     Head: Normocephalic and atraumatic.     Right Ear: Tympanic membrane and external ear normal. There is no impacted cerumen.     Left Ear: Tympanic membrane and external ear normal. There is no impacted cerumen.     Mouth/Throat:     Mouth: Mucous membranes are moist.     Pharynx: Oropharynx is clear.  Cardiovascular:     Rate and Rhythm: Normal rate and regular rhythm.  Pulmonary:     Effort: Pulmonary effort is normal.     Breath sounds: Rhonchi present.  Musculoskeletal:        General: Normal range of motion.     Cervical back: Normal range of motion and neck supple.  Skin:    General: Skin is warm and dry.  Neurological:     General: No focal deficit present.     Mental Status: He is alert and oriented to person, place, and time. Mental status is at baseline.  Psychiatric:        Mood and Affect: Mood normal.        Behavior: Behavior normal.        Thought Content: Thought content normal.        Judgment: Judgment normal.       Lab Results:  CBC    Component Value Date/Time   WBC 16.3 (H) 12/09/2015 0557   RBC 4.38 12/09/2015 0557   HGB 14.0 12/09/2015 0557   HCT 43.6 12/09/2015 0557   PLT 111 (L) 12/09/2015 0557   MCV 99.5 12/09/2015 0557   MCH 32.0 12/09/2015 0557   MCHC 32.1 12/09/2015 0557   RDW 15.2 12/09/2015 0557   LYMPHSABS 0.7 12/08/2015 0551   MONOABS 0.3 12/08/2015 0551   EOSABS 0.0 12/08/2015 0551   BASOSABS 0.0 12/08/2015 0551    BMET    Component Value Date/Time   NA 139 12/09/2015 0557   K 4.7 12/09/2015 0557   CL 109 12/09/2015 0557   CO2 24 12/09/2015 0557   GLUCOSE 176 (H) 12/09/2015 0557   BUN 17 12/09/2015 0557   CREATININE 0.94 12/09/2015 0557   CALCIUM 8.4 (L) 12/09/2015 0557   GFRNONAA >60 12/09/2015 0557   GFRAA >60 12/09/2015 0557    BNP No results found for: "BNP"  ProBNP    Component Value Date/Time   PROBNP 125.2 (H) 10/31/2013 1645    Imaging: US Abdomen Limited RUQ (LIVER/GB)  Result Date: 04/04/2022 CLINICAL DATA:  Cirrhosis EXAM: ULTRASOUND ABDOMEN LIMITED RIGHT UPPER QUADRANT COMPARISON:  None Available. FINDINGS: Gallbladder: Contains multiple echogenic shadowing calculi. No abnormal wall thickening or pericholecystic fluid. Negative sonographic Murphy's sign. Common bile duct: Diameter: 5 mm Liver: Mild nodularity of the contour. Increased echogenicity of the parenchyma with no focal mass identified. Portal vein is patent on color Doppler imaging with normal direction of blood flow towards the liver. Other: None. IMPRESSION: 1. Evidence of hepatic cirrhosis with no focal hepatic mass identified. 2. Cholelithiasis. Electronically Signed   By: Jannifer Hick M.D.   On: 04/04/2022 16:29   DG Chest 2 View  Result Date: 03/22/2022 CLINICAL DATA:  COPD exac fu EXAM: CHEST - 2 VIEW COMPARISON:  April 07, 2021 FINDINGS: The heart size and mediastinal contours are within normal limits. No focal consolidation, vascular congestion  or pleural effusion. Lungs are  hyperinflated with prominent bronchovascular markings. The visualized skeletal structures are unremarkable. IMPRESSION: Lungs are hyperinflated. No focal consolidation, pleural effusion or vascular congestion. Prominent bronchovascular markings. Electronically Signed   By: Marjo Bicker M.D.   On: 03/22/2022 11:27     Assessment & Plan:   COPD (chronic obstructive pulmonary disease) with emphysema (HCC) - Patient is doing very well today, this is the best I have seen him in some time. He reports less congestion at night. No significant cough. Dyspnea is baseline, improved over the last year with daily exercise. CAT score 21. Still smoking but has cut way back.  - Continue Trelegy , hypertonic saline neb twice daily, daliresp daily and prn albuterol - FU in 3 months or sooner if needed   Chronic respiratory failure with hypoxia (HCC) - Resolved; O2 98% RA. Not currently requiring supplemental oxygen  Tobacco use - Current smoker. Patient has cut back from 3 ppd to 4 cigarettes a day. Continue to encourage smoking cessation. He has quit three times in the past, he is not ready today   Post-nasal drip - Continue Astelin nasal spray twice daily  GERD (gastroesophageal reflux disease) - Nocturnal cough improved with addition of Famotidine at bedtime    [name], NP 04/21/2022

## 2022-04-21 NOTE — Assessment & Plan Note (Signed)
-   Continue Astelin nasal spray twice daily

## 2022-04-21 NOTE — Assessment & Plan Note (Signed)
-   Current smoker. Patient has cut back from 3 ppd to 4 cigarettes a day. Continue to encourage smoking cessation. He has quit three times in the past, he is not ready today

## 2022-04-21 NOTE — Assessment & Plan Note (Addendum)
-   Resolved; O2 98% RA. Not currently requiring supplemental oxygen

## 2022-04-21 NOTE — Patient Instructions (Signed)
Recommendations Continue Trelegy 200 mcg take 1 puff daily Continue hypertonic saline nebulizer twice daily to loosen congestion/cough  Use albuterol nebulizer every 4-6 hours as needed for shortness of breath or wheezing Continue Daliresp 500 mcg daily Continue famotidine at bedtime Continue Astelin nasal spray twice daily  Follow-up: 3 months with Dr. Marchelle Gearing or Waynetta Sandy Np

## 2022-04-21 NOTE — Assessment & Plan Note (Signed)
-   Patient is doing very well today, this is the best I have seen him in some time. He reports less congestion at night. No significant cough. Dyspnea is baseline, improved over the last year with daily exercise. CAT score 21. Still smoking but has cut way back.  - Continue Trelegy , hypertonic saline neb twice daily, daliresp daily and prn albuterol - FU in 3 months or sooner if needed

## 2022-06-17 ENCOUNTER — Other Ambulatory Visit: Payer: Self-pay | Admitting: Primary Care

## 2022-06-30 ENCOUNTER — Telehealth: Payer: Self-pay | Admitting: Primary Care

## 2022-06-30 MED ORDER — PREDNISONE 10 MG PO TABS: 10.0000 mg | ORAL_TABLET | Freq: Every day | ORAL | 0 refills | Status: DC

## 2022-06-30 MED ORDER — DOXYCYCLINE HYCLATE 100 MG PO TABS
100.0000 mg | ORAL_TABLET | Freq: Two times a day (BID) | ORAL | 0 refills | Status: DC
Start: 2022-06-30 — End: 2022-08-01

## 2022-06-30 NOTE — Telephone Encounter (Signed)
I spoke with the [facility] Lopez and notified him of response per Dr Marchelle Gearing. He verbalized understanding. I have sent rxs to preferred pharm.    MR- Glen Lopez states "would do anything to get better" and is interested in research study that you mentioned for COPD exacerbations.

## 2022-06-30 NOTE — Telephone Encounter (Signed)
Spoke with the pt  He is c/o increased chest congestion and cough with minimal clear sputum over the past few days  He is not wheezing, no increased SOB, and denies fevers  He states it's worse when he first gets up in the am, may be having some PND  He takes mucinex, claritin, saline and albuterol nebs, trelegy, daliresp and using flutter bid  He states usually develops similar symptoms when the weather changes  Please advise, thanks!  Allergies  Allergen Reactions   Codeine Nausea And Vomiting

## 2022-06-30 NOTE — Telephone Encounter (Addendum)
    You have attack of copd called COPD exacerbation. This follows [facility] 03/22/22   Please take doxycycline 100mg  twice daily after meals x 5 days; avoid sunlight  Please take prednisone 40 mg x1 day, then 30 mg x1 day, then 20 mg x1 day, then 10 mg x1 day, and then 5 mg x1 day and stop  Also check COVID antigen testing.  At least 2 times.  If it is positive he will have to call for antivirals  Also let him know given frequent exacerbations he might want to consider research protocol of an injection therapy being developed -to prevent COPD exacerbation.  If he is interested in this in the next few months the research team can call him   Allergies  Allergen Reactions   Codeine Nausea And Vomiting

## 2022-06-30 NOTE — Telephone Encounter (Signed)
Patient called to ask the doctor to send him in an antibiotic for his chest congestion.  He stated he always gets this when the weather changes.  He would like it sent to [facility] on [address] in [place].  Please advise.

## 2022-07-01 NOTE — Telephone Encounter (Signed)
Thanks the purpose of trial primarily is for him to help in drug development. But we will make sure he understands that when we send ICF. Will close message

## 2022-07-13 ENCOUNTER — Other Ambulatory Visit: Payer: Self-pay | Admitting: Primary Care

## 2022-07-13 DIAGNOSIS — J441 Chronic obstructive pulmonary disease with (acute) exacerbation: Secondary | ICD-10-CM

## 2022-07-18 ENCOUNTER — Telehealth: Payer: Self-pay | Admitting: Internal Medicine

## 2022-07-18 DIAGNOSIS — J441 Chronic obstructive pulmonary disease with (acute) exacerbation: Secondary | ICD-10-CM

## 2022-07-18 MED ORDER — ROFLUMILAST 500 MCG PO TABS: ORAL_TABLET | ORAL | 2 refills | Status: DC

## 2022-07-18 NOTE — Telephone Encounter (Signed)
Medication sent in per patients request. Nothing further needed

## 2022-07-28 ENCOUNTER — Ambulatory Visit: Payer: Medicare HMO | Admitting: Internal Medicine

## 2022-08-01 ENCOUNTER — Ambulatory Visit (INDEPENDENT_AMBULATORY_CARE_PROVIDER_SITE_OTHER): Payer: Medicare HMO | Admitting: Primary Care

## 2022-08-01 ENCOUNTER — Encounter: Payer: Self-pay | Admitting: Primary Care

## 2022-08-01 DIAGNOSIS — J438 Other emphysema: Secondary | ICD-10-CM | POA: Diagnosis not present

## 2022-08-01 DIAGNOSIS — Z720 Tobacco use: Secondary | ICD-10-CM

## 2022-08-01 DIAGNOSIS — J9611 Chronic respiratory failure with hypoxia: Secondary | ICD-10-CM | POA: Diagnosis not present

## 2022-08-01 NOTE — Assessment & Plan Note (Addendum)
-   No daytime O2 requirements - Continue 3L oxygen at night

## 2022-08-01 NOTE — Progress Notes (Signed)
@Patient  ID: Glen Lopez, male    DOB: 14-May-1956, 66 y.o.   MRN: [id number]  Chief Complaint  Patient presents with   Follow-up    Referring provider: [id number], NP  HPI: 66 year old male, current everyday smoker. PMH severe COPD, chronic resp failure with hypoxia, HTN, tobacco use, post nasal drip. Patient of Dr. 71. Maintained on Trelegy 100 1 puff daily, prn ventolin and hypertonic saline nebulizer as needed. Continue to encourage complete smoking cessation. Following with lung cancer screening clinic.   Previous [facility] pulmonary encounter: 03/22/2022 Patient was last seen in January 2023 and was doing well. At that time he was on Trelegy, albuterol prn, hypertonic saline BID, Daliresp, reported less flare ups. Patient called our office on 03/09/21 for cough. This is his second exacerbation since March requiring prednisone and antibiotics. Sent in doxycycline and prednisone 40mg  x 5 days.   He is feeling some better but having a lot of mucus production at night. He describes mucus as fluid like which he feels is coming from his chest and makes him hoarse. He has some associated nasal drainage.  He is not taking reflux medication or over-the-counter nasal sprays.  He is compliant with Trelegy 100 and Daliresp 500 mcg daily.  He uses albuterol nebulizer 1-2 times daily.  He is not currently using hypertonic saline nebulizer or taking Mucinex. He uses flutter valve daily.  He continues to smoke 3 to 4 cigarettes a day, he is actively trying to cut back and quit.  04/21/2022 Patient presents today for 2-week follow-up/COPD exacerbation. He has had 2 exacerbations since March. His main complaint is sputum production at night. He completed course of doxycyline and prednisone but did not see 100% improvement in his symptoms. He is maintained on Trelegy 04/23/2022 and Daliresp April. Recommended starting hypertonic saline nebs and mucinex. We also increased Trelegy dose to and started  him on famotidine and astelin nasal spray for underlying GERD/ PND which felt to be exacerbating his cough at night.   He is feeling better. He reports less congestion. No significant cough at night. Baseline dyspnea. He walks his dog/exercise twice daily, feels breathing has improved the last year since doing this. CAT 21   08/01/2022- Interim hx  Patient presents today for 3 month follow-up/ COPD, emphysema, chronic respiratory failure and tobacco abuse.  He is doing well. Dyspnea symptoms are baseline. He developed chest congestion a couple weeks back. He was sent in doxycycline and prednisone. Symptoms have cleared- no purulent mucus. He is compliant with Trelegy one puff daily and Daliresp 10/01/2022 daily. He uses Albuterol 1-2 times a day. He takes mucinex as needed. He is limited doing some activities at home. He sleeps fairly well.   COPD  Respiratory symptoms- Chest congestion. Dyspnea baseline. No wheezing.   Last exacerbation- Last week treated with doxycycline  Maintenance medications - Trelegy , daliresp SABA - twice a day  Oxygen - He wears 3L oxygen at night; not requiring daytime  Prednisone - August 2023 Smoking status- Current smoker, smoking 1-3 cigarettes a day  CAT score- 14   Allergies  Allergen Reactions   Codeine Nausea And Vomiting    Immunization History  Administered Date(s) Administered   Influenza Split 09/23/2017   Influenza, High Dose Seasonal PF 07/19/2018, 09/30/2021   Influenza, Quadrivalent, Recombinant, Inj, Pf 07/29/2019   Influenza,inj,Quad PF,6+ Mos 09/09/2015, 07/14/2016   Influenza-Unspecified 07/31/2014, 07/31/2020   PFIZER(Purple Top)SARS-COV-2 Vaccination 01/15/2020, 02/16/2020, 08/14/2020   Pneumococcal  Conjugate-13 07/19/2018   Pneumococcal Polysaccharide-23 10/31/2013, 07/19/2018    Past Medical History:  Diagnosis Date   Anxiety    Arthritis    Asthma    Bipolar disorder (HCC)    COPD (chronic obstructive  pulmonary disease) (HCC)    Depression    GERD (gastroesophageal reflux disease)    Headache(784.0)    Hiatal hernia    Hypertension     Tobacco History: Social History   Tobacco Use  Smoking Status Every Day   Packs/day: 3.00   Years: 48.00   Total pack years: 144.00   Types: Cigarettes   Passive exposure: Past  Smokeless Tobacco Never  Tobacco Comments   3-4 cigs per day as of 08/01/22 LW   Ready to quit: Not Answered Counseling given: Not Answered Tobacco comments: 3-4 cigs per day as of 08/01/22 LW   Outpatient Medications Prior to Visit  Medication Sig Dispense Refill   albuterol (PROVENTIL) (2.5 MG/3ML) 0.083% nebulizer solution Take 3 mLs (2.5 mg total) by nebulization every 6 (six) hours as needed. For shortness of breath 75 mL 5   albuterol (VENTOLIN HFA) 108 (90 Base) MCG/ACT inhaler Inhale 2 puffs into the lungs every 4 (four) hours as needed for shortness of breath 18 g 0   ALPRAZolam (XANAX) 0.5 MG tablet Take 1 tablet (0.5 mg total) by mouth 3 (three) times daily as needed for panic attacks 90 tablet 3   amLODipine (NORVASC) 5 MG tablet Take 1 tablet by mouth daily.     aspirin EC 81 MG tablet Take 1 tablet (81 mg total) by mouth daily.     atenolol-chlorthalidone (TENORETIC) 100-25 MG tablet Take 1 tablet by mouth daily. 30 tablet 11   atorvastatin (LIPITOR) 80 MG tablet Take 1 tablet (80 mg total) by mouth daily at 6pm. 30 tablet 11   azelastine (ASTELIN) 0.1 % nasal spray Place 1 spray into both nostrils 2 (two) times daily. Use in each nostril as directed 30 mL 1   carvedilol (COREG) 3.125 MG tablet Take 1 tablet (3.125 mg total) by mouth 2 (two) times daily with food 180 tablet 3   famotidine (PEPCID) 20 MG tablet Take 1 tablet (20 mg total) by mouth at bedtime. 30 tablet 5   fluticasone (FLONASE) 50 MCG/ACT nasal spray SHAKE LIQUID AND USE 2 SPRAYS(100 MCG) IN EACH NOSTRIL EVERY DAY 16 g 3   Fluticasone-Umeclidin-Vilant (TRELEGY ELLIPTA) 200-62.5-25 MCG/ACT  AEPB Inhale 1 puff into the lungs daily. 60 each 5   lisinopril (ZESTRIL) 5 MG tablet Take 1 tablet (5 mg total) by mouth daily. 90 tablet 3   nitroGLYCERIN (NITROSTAT) 0.4 MG SL tablet Place 1 tablet (0.4 mg) under your tongue for chest pain, every 5 min x3 doses. Call EMS on your second dose. 25 tablet 11   Respiratory Therapy Supplies (FLUTTER) DEVI Use 3 times a day. 1 each 0   roflumilast (DALIRESP) 500 MCG TABS tablet TAKE 1 TABLET(500 MCG) BY MOUTH DAILY 30 tablet 2   rosuvastatin (CRESTOR) 20 MG tablet Take 1 tablet (20 mg total) by mouth at bedtime. 90 tablet 3   sertraline (ZOLOFT) 50 MG tablet Take 1 tablet (50 mg total) by mouth daily. 90 tablet 3   doxycycline (VIBRA-TABS) 100 MG tablet Take 1 tablet (100 mg total) by mouth 2 (two) times daily. 10 tablet 0   predniSONE (DELTASONE) 10 MG tablet Take 1 tablet (10 mg total) by mouth daily with breakfast. 11 tablet 0   gabapentin (NEURONTIN) 300 MG  capsule TAKE 1 CAPSULE (300 MG) BY MOUTH 3 TIMES PER DAY FOR 90 DAYS 270 capsule 3   loratadine (CLARITIN) 10 MG tablet TAKE 1 TABLET (10 MG) BY MOUTH ONCE DAILY FOR 30 DAYS 30 tablet 3   Facility-Administered Medications Prior to Visit  Medication Dose Route Frequency Provider Last Rate Last Admin   levalbuterol (XOPENEX) nebulizer solution 0.63 mg  0.63 mg Nebulization Q6H Martyn Ehrich, NP        Review of Systems  Review of Systems  Constitutional: Negative.   HENT:  Negative for congestion.   Respiratory:  Positive for cough. Negative for chest tightness, shortness of breath and wheezing.        Dyspnea baseline    Physical Exam  BP 136/72 (BP Location: Right Arm, Patient Position: Sitting, Cuff Size: Normal)   Pulse 96   Temp 98.7 F (37.1 C) (Oral)   Ht 5\' 5"  (1.651 m)   Wt 161 lb 6.4 oz (73.2 kg)   SpO2 92%   BMI 26.86 kg/m  Physical Exam Constitutional:      Appearance: Normal appearance.  HENT:     Mouth/Throat:     Mouth: Mucous membranes are moist.      Pharynx: Oropharynx is clear.  Cardiovascular:     Rate and Rhythm: Normal rate and regular rhythm.  Pulmonary:     Effort: Pulmonary effort is normal.     Breath sounds: Rhonchi present. No wheezing.  Skin:    General: Skin is warm and dry.  Neurological:     General: No focal deficit present.     Mental Status: He is alert and oriented to person, place, and time. Mental status is at baseline.  Psychiatric:        Mood and Affect: Mood normal.        Behavior: Behavior normal.        Thought Content: Thought content normal.        Judgment: Judgment normal.      Lab Results:  CBC    Component Value Date/Time   WBC 16.3 (H) 12/09/2015 0557   RBC 4.38 12/09/2015 0557   HGB 14.0 12/09/2015 0557   HCT 43.6 12/09/2015 0557   PLT 111 (L) 12/09/2015 0557   MCV 99.5 12/09/2015 0557   MCH 32.0 12/09/2015 0557   MCHC 32.1 12/09/2015 0557   RDW 15.2 12/09/2015 0557   LYMPHSABS 0.7 12/08/2015 0551   MONOABS 0.3 12/08/2015 0551   EOSABS 0.0 12/08/2015 0551   BASOSABS 0.0 12/08/2015 0551    BMET    Component Value Date/Time   NA 139 12/09/2015 0557   K 4.7 12/09/2015 0557   CL 109 12/09/2015 0557   CO2 24 12/09/2015 0557   GLUCOSE 176 (H) 12/09/2015 0557   BUN 17 12/09/2015 0557   CREATININE 0.94 12/09/2015 0557   CALCIUM 8.4 (L) 12/09/2015 0557   GFRNONAA >60 12/09/2015 0557   GFRAA >60 12/09/2015 0557    BNP No results found for: "BNP"  ProBNP    Component Value Date/Time   PROBNP 125.2 (H) 10/31/2013 1645    Imaging: No results found.   Assessment & Plan:   COPD (chronic obstructive pulmonary disease) with emphysema ([place]) - Overall he is doing well. He had some chest congestion a couple weeks ago which cleared after taking Doxycycline. He is compliant with Trelegy 256mcg and Daliresp 593mcg once daily. He uses Albuterol 1-2 times a day. CAT score 14. No changes to plan today. Continue to  encourage smoking cessation. FU in 3-4 months or sooner if needed.    Chronic respiratory failure with hypoxia (HCC) - No daytime O2 requirements - Continue 3L oxygen at night   Tobacco use - Current smoker, 2-3 cigarettes a day. Continue to encourage smoking cessation    [name], NP 08/01/2022

## 2022-08-01 NOTE — Assessment & Plan Note (Addendum)
-   Overall he is doing well. He had some chest congestion a couple weeks ago which cleared after taking Doxycycline. He is compliant with Trelegy 234mcg and Daliresp 563mcg once daily. He uses Albuterol 1-2 times a day. CAT score 14. No changes to plan today. Continue to encourage smoking cessation. FU in 3-4 months or sooner if needed.

## 2022-08-01 NOTE — Assessment & Plan Note (Addendum)
-   Current smoker, 2-3 cigarettes a day. Continue to encourage smoking cessation  - Due for annual LDCT in January 2024, this has already been ordered

## 2022-08-01 NOTE — Patient Instructions (Addendum)
Looked well today.  Glad you are feeling better since taking doxycycline No medication changes today   Recommendations Continue Trelegy 1 puff daily Continue Daliresp 500 mcg 1 tablet daily Use albuterol rescue inhaler or nebulizer every 4-6 hours as needed for breakthrough shortness of breath or wheezing Take Mucinex or Robitussin as needed for congestion Use flutter valve as needed for chest congestion Continue to wear 3 L of oxygen at bedtime  Continue to work on smoking cessation efforts, taper amount and pick quit date and attempt to quit smoking  Orders Simple walk test; if O2 less than 88% please qualify for POC  Follow-up: 3-4 months with Dr. Chase Caller or sooner if needed

## 2022-08-17 ENCOUNTER — Other Ambulatory Visit: Payer: Self-pay | Admitting: Student

## 2022-08-17 DIAGNOSIS — M549 Dorsalgia, unspecified: Secondary | ICD-10-CM

## 2022-08-17 DIAGNOSIS — M25551 Pain in right hip: Secondary | ICD-10-CM

## 2022-08-29 ENCOUNTER — Ambulatory Visit: Payer: Medicare HMO | Admitting: Orthopaedic Surgery

## 2022-08-29 ENCOUNTER — Inpatient Hospital Stay: Admission: RE | Admit: 2022-08-29 | Payer: Medicare HMO | Source: Ambulatory Visit

## 2022-09-01 ENCOUNTER — Ambulatory Visit
Admission: RE | Admit: 2022-09-01 | Discharge: 2022-09-01 | Disposition: A | Discharge status: Home / Self Care | Payer: Medicare HMO | Source: Ambulatory Visit | Attending: Physician Assistant | Admitting: Physician Assistant

## 2022-09-01 ENCOUNTER — Ambulatory Visit (INDEPENDENT_AMBULATORY_CARE_PROVIDER_SITE_OTHER): Payer: Medicare HMO

## 2022-09-01 ENCOUNTER — Other Ambulatory Visit: Payer: Self-pay

## 2022-09-01 VITALS — BP 169/97 | HR 98 | Temp 98.5°F | Resp 18

## 2022-09-01 DIAGNOSIS — W19XXXA Unspecified fall, initial encounter: Secondary | ICD-10-CM

## 2022-09-01 DIAGNOSIS — M25511 Pain in right shoulder: Secondary | ICD-10-CM

## 2022-09-01 NOTE — Discharge Instructions (Signed)
No fracture noted on xray- expect soft tissue injury as discussed.  Use muscle relaxer if needed as previously prescribed.   Follow up with ortho if no gradual improvement or with any worsening symptoms.

## 2022-09-01 NOTE — ED Triage Notes (Signed)
Pt here for right shoulder pain after tripping on cable outside 4 days ago

## 2022-09-01 NOTE — ED Provider Notes (Signed)
[facility]    [name]: [id number] Arrival date & time: 09/01/22  0935      History   Chief Complaint Chief Complaint  Patient presents with   Shoulder Pain    Walking my dog Sunday night tripped over a cable that [employer] didn't bury after installing my wifi and landing on my  right shoulder pain has not gotten any better. Made a appointment with  Dr Allie Bossier but it's November 27 but I can't wait. - Entered by patient    HPI Glen Lopez is a 66 y.o. male.   Patient here today for evaluation on right shoulder pain that started after he tripped over a wire/ cable 4 days ago. He reports that [employer] had failed to bury a cable after installing his WiFi. He has not had any improvement of shoulder pain which brought him here for evaluation today. He has not had any numbness or tingling. Movement of right shoulder/ arm worsens pain. He has taken his typical meds for other chronic pain without resolution.   The history is provided by the patient.  Shoulder Pain Associated symptoms: no fever     Past Medical History:  Diagnosis Date   Anxiety    Arthritis    Asthma    Bipolar disorder (HCC)    COPD (chronic obstructive pulmonary disease) (HCC)    Depression    GERD (gastroesophageal reflux disease)    Headache(784.0)    Hiatal hernia    Hypertension     Patient Active Problem List   Diagnosis Date Noted   GERD (gastroesophageal reflux disease) 04/21/2022   Abnormal CT of the chest 07/19/2018   Tobacco use 06/25/2018   COPD, severe (HCC) 07/14/2016   Post-nasal drip 07/14/2016   Acute encephalopathy 12/08/2015   Elevated troponin 12/08/2015   Chronic respiratory failure with hypoxia (HCC) 09/10/2015   Oral thrush 09/10/2015   Throat pain 12/23/2014   COPD (chronic obstructive pulmonary disease) with emphysema (HCC) 10/31/2013   Essential hypertension, benign 10/31/2013   Bipolar disorder, unspecified (HCC) 10/31/2013    Past Surgical History:   Procedure Laterality Date   artheroscopic knee surgery R Right 01/2013       Home Medications    Prior to Admission medications   Medication Sig Start Date End Date Taking? Authorizing Provider  albuterol (PROVENTIL) (2.5 MG/3ML) 0.083% nebulizer solution Take 3 mLs (2.5 mg total) by nebulization every 6 (six) hours as needed. For shortness of breath 07/05/18   Kalman Shan, MD  albuterol (VENTOLIN HFA) 108 (90 Base) MCG/ACT inhaler Inhale 2 puffs into the lungs every 4 (four) hours as needed for shortness of breath 09/14/21   Kalman Shan, MD  ALPRAZolam Prudy Feeler) 0.5 MG tablet Take 1 tablet (0.5 mg total) by mouth 3 (three) times daily as needed for panic attacks 10/07/21     amLODipine (NORVASC) 5 MG tablet Take 1 tablet by mouth daily.    [provider]  aspirin EC 81 MG tablet Take 1 tablet (81 mg total) by mouth daily. 12/09/15   Renae Fickle, MD  atenolol-chlorthalidone (TENORETIC) 100-25 MG tablet Take 1 tablet by mouth daily. 12/02/21     atorvastatin (LIPITOR) 80 MG tablet Take 1 tablet (80 mg total) by mouth daily at 6pm. 12/02/21     azelastine (ASTELIN) 0.1 % nasal spray Place 1 spray into both nostrils 2 (two) times daily. Use in each nostril as directed 03/22/22   [name], NP  carvedilol (COREG) 3.125 MG tablet  Take 1 tablet (3.125 mg total) by mouth 2 (two) times daily with food 10/07/21     famotidine (PEPCID) 20 MG tablet Take 1 tablet (20 mg total) by mouth at bedtime. 03/22/22   [name], NP  fluticasone [place]) 50 MCG/ACT nasal spray SHAKE LIQUID AND USE 2 SPRAYS(100 MCG) IN [facility] NOSTRIL EVERY DAY 06/20/22   [name], NP  Fluticasone-Umeclidin-Vilant (TRELEGY ELLIPTA) 200-62.5-25 MCG/ACT AEPB Inhale 1 puff into the lungs daily. 04/21/22   [name], NP  gabapentin (NEURONTIN) 300 MG capsule TAKE 1 CAPSULE (300 MG) BY MOUTH 3 TIMES PER DAY FOR 90 DAYS 01/06/21 03/22/22  Hillery Aldo, NP  lisinopril (ZESTRIL) 5 MG tablet Take  1 tablet (5 mg total) by mouth daily. 12/17/21     loratadine (CLARITIN) 10 MG tablet TAKE 1 TABLET (10 MG) BY MOUTH ONCE DAILY FOR 30 DAYS 12/17/20 12/17/21  Hillery Aldo, NP  nitroGLYCERIN (NITROSTAT) 0.4 MG SL tablet Place 1 tablet (0.4 mg) under your tongue for chest pain, every 5 min x3 doses. Call EMS on your second dose. 10/07/21     Respiratory Therapy Supplies (FLUTTER) DEVI Use 3 times a day. 07/09/18   [name], NP  roflumilast (DALIRESP) 500 MCG TABS tablet TAKE 1 TABLET(500 MCG) BY MOUTH DAILY 07/18/22   Kalman Shan, MD  rosuvastatin (CRESTOR) 20 MG tablet Take 1 tablet (20 mg total) by mouth at bedtime. 06/21/21     sertraline (ZOLOFT) 50 MG tablet Take 1 tablet (50 mg total) by mouth daily. 10/07/21       Family History Family History  Problem Relation Age of Onset   Heart disease Father        MI at age 7   Asthma Sister     Social History Social History   Tobacco Use   Smoking status: Every Day    Packs/day: 3.00    Years: 48.00    Total pack years: 144.00    Types: Cigarettes    Passive exposure: Past   Smokeless tobacco: Never   Tobacco comments:    3-4 cigs per day as of 08/01/22 LW  Substance Use Topics   Alcohol use: No    Alcohol/week: 0.0 standard drinks of alcohol   Drug use: Yes    Types: Cocaine    Comment: First tiem user     Allergies   Codeine   Review of Systems Review of Systems  Constitutional:  Negative for chills and fever.  Eyes:  Negative for discharge and redness.  Musculoskeletal:  Positive for arthralgias and myalgias.  Skin:  Negative for color change and wound.  Neurological:  Negative for numbness.     Physical Exam Triage Vital Signs ED Triage Vitals  Enc Vitals Group     BP      Pulse      Resp      Temp      Temp src      SpO2      Weight      Height      Head Circumference      Peak Flow      Pain Score      Pain Loc      Pain Edu?      Excl. in GC?    No data found.  Updated Vital  Signs BP (!) 169/97 (BP Location: Left Arm)   Pulse 98   Temp 98.5 F (36.9 C) (Oral)   Resp 18  SpO2 92%   Visual Acuity Right Eye Distance:   Left Eye Distance:   Bilateral Distance:    Right Eye Near:   Left Eye Near:    Bilateral Near:     Physical Exam Vitals and nursing note reviewed.  Constitutional:      General: He is not in acute distress.    Appearance: Normal appearance. He is not ill-appearing.  HENT:     Head: Normocephalic and atraumatic.  Eyes:     Conjunctiva/sclera: Conjunctivae normal.  Cardiovascular:     Rate and Rhythm: Normal rate.  Pulmonary:     Effort: Pulmonary effort is normal.  Musculoskeletal:     Comments: Decreased ROM of right shoulder due to pain with movement in all directions. TTP noted to right clavicle, upper right trapezius  Neurological:     Mental Status: He is alert.     Comments: Grip strength 4/5 on left, 3/5 on right  Psychiatric:        Mood and Affect: Mood normal.        Behavior: Behavior normal.        Thought Content: Thought content normal.      UC Treatments / Results  Labs (all labs ordered are listed, but only abnormal results are displayed) Labs Reviewed - No data to display  EKG   Radiology DG Clavicle Right  Result Date: 09/01/2022 CLINICAL DATA:  Recent fall with right shoulder pain, initial encounter EXAM: RIGHT CLAVICLE - 2+ VIEWS COMPARISON:  11/30/2015 FINDINGS: Degenerative changes of the acromioclavicular joint are seen. No acute fracture or dislocation is noted. No soft tissue abnormality is seen. IMPRESSION: No acute abnormality noted. Electronically Signed   By: Inez Catalina M.D.   On: 09/01/2022 10:38    Procedures Procedures (including critical care time)  Medications Ordered in UC Medications - No data to display  Initial Impression / Assessment and Plan / UC Course  I have reviewed the triage vital signs and the nursing notes.  Pertinent labs & imaging results that were available  during my care of the patient were reviewed by me and considered in my medical decision making (see chart for details).    No fracture on x-ray.  Discussed most likely soft tissue injury.  Recommended treatment with anti-inflammatory/steroid and muscle relaxer.  Patient reports that he has prednisone and Flexeril at home.  Encouraged patient to use same for treatment and follow-up with Ortho if no gradual improvement or with any further concerns.  Patient expresses understanding.  Final Clinical Impressions(s) / UC Diagnoses   Final diagnoses:  Fall, initial encounter  Acute pain of right shoulder     Discharge Instructions      No fracture noted on xray- expect soft tissue injury as discussed.  Use muscle relaxer if needed as previously prescribed.   Follow up with ortho if no gradual improvement or with any worsening symptoms.      ED Prescriptions   None    PDMP not reviewed this encounter.   Francene Finders, PA-C 09/01/22 1108

## 2022-09-12 ENCOUNTER — Telehealth: Payer: Self-pay | Admitting: Primary Care

## 2022-09-12 MED ORDER — DOXYCYCLINE HYCLATE 100 MG PO TABS: 100.0000 mg | ORAL_TABLET | Freq: Two times a day (BID) | ORAL | 0 refills | Status: DC

## 2022-09-12 MED ORDER — PREDNISONE 10 MG PO TABS
ORAL_TABLET | ORAL | 0 refills | Status: DC
Start: 2022-09-12 — End: 2022-12-08

## 2022-09-12 NOTE — Telephone Encounter (Signed)
Make sure he is still taking hypertonic saline nebulizer, he can use three times a day to loosen congestion. He can use albuterol every 6 hours for shortness of breath. Take Mucinex 1,200mg  twice a day. I will send in prednisone taper. If cough becomes purulent call us and will send in abx. Continue Trelegy and daliresp.

## 2022-09-12 NOTE — Telephone Encounter (Signed)
Spoke to pt who states increased cough and chest congestion since Saturday night. Pt denies fever/ chills/ SOB/ GI upset. Pt does have slight wheezing. Pt states that he was feeling better after completing doxycyline last month until this weekend when the weather turned colder. Pt states taking all meds as directed and using Mucinex OTC. Beth please advise.

## 2022-09-12 NOTE — Telephone Encounter (Signed)
Sent. If not better or symptoms recurrent needs visit in office

## 2022-09-12 NOTE — Telephone Encounter (Signed)
Called and spoke with patient. He verbalized understanding. He stated that he has been taking prednisone as he already had some at home. He also confirmed that he is using the hypertonic saline solution.   He stated that he would like to have the doxy sent in as this is the only thing that works for him.   Beth, can you please advise? Thanks!

## 2022-09-13 ENCOUNTER — Ambulatory Visit (INDEPENDENT_AMBULATORY_CARE_PROVIDER_SITE_OTHER): Payer: Medicare HMO | Admitting: Orthopaedic Surgery

## 2022-09-13 ENCOUNTER — Other Ambulatory Visit: Payer: Self-pay | Admitting: Primary Care

## 2022-09-13 ENCOUNTER — Ambulatory Visit (INDEPENDENT_AMBULATORY_CARE_PROVIDER_SITE_OTHER): Payer: Medicare HMO

## 2022-09-13 DIAGNOSIS — G8929 Other chronic pain: Secondary | ICD-10-CM | POA: Diagnosis not present

## 2022-09-13 DIAGNOSIS — M5441 Lumbago with sciatica, right side: Secondary | ICD-10-CM | POA: Diagnosis not present

## 2022-09-13 DIAGNOSIS — M5442 Lumbago with sciatica, left side: Secondary | ICD-10-CM

## 2022-09-13 DIAGNOSIS — M25511 Pain in right shoulder: Secondary | ICD-10-CM

## 2022-09-13 MED ORDER — METHYLPREDNISOLONE ACETATE 40 MG/ML IJ SUSP
40.0000 mg | INTRAMUSCULAR | Status: AC | PRN
  Administered 2022-09-13: 40 mg via INTRA_ARTICULAR

## 2022-09-13 MED ORDER — LIDOCAINE HCL 1 % IJ SOLN
3.0000 mL | INTRAMUSCULAR | Status: AC | PRN
  Administered 2022-09-13: 3 mL

## 2022-09-13 NOTE — Telephone Encounter (Signed)
Called and spoke with patient. He is aware that doxy has been sent in.   Nothing further needed.

## 2022-09-13 NOTE — Progress Notes (Signed)
The patient is someone I have not seen in a long period of time.  He has chronic spine issues and some time ago did have some type of facet joint injection in his lumbar spine by someone else in town.  He says he still has pain in his back when standing and it radiates into his left sciatic area and some of the right side but is mainly the midline of the low lumbar spine is a source of his pain.  He also comes in today with acute right shoulder injury.  He is right-hand dominant he was out walking his dog in the dark and there was a cable line that was not buried.  He tripped over this cable line landing on his shoulder.  X-rays were obtained of the clavicle and shoulder in the emergency room and I reviewed these.  They were negative for any type of fracture.  He says the shoulder is feeling a little bit better but it does hurt with overhead activities and reaching behind him.  On exam of his right shoulder he does show some signs of contusion of the shoulder.  His rotator cuff itself is intact and he can reach overhead and behind but is painful to do so.  I did review the x-rays of the shoulder as well and showed no fracture or acute injury.  Examination of his lumbar spine shows pain with flexion extension along the midline the lumbar spine and radiates more to the left sciatic area than the right.  He does have positive straight leg raise on the left side.  He did let us know he has a MRI scheduled for November 17.  Once we have those results we can determine whether or not he would benefit from another injection in his spine.  He said that this helped him for many years now and he hopes that would be the best treatment option.  I did recommend a steroid injection in his right shoulder subacromial outlet and he agreed to this and tolerated it well.  For her shoulder standpoint we will see him back in 4 weeks but hopefully will have the results of the MRI so we can set him up for some type of intervention in  the spine if needed.       Procedure Note  Patient: Glen Lopez             Date of Birth: 01-23-1956           MRN: [id number]             Visit Date: 09/13/2022  Procedures: Visit Diagnoses:  1. Chronic right-sided low back pain with right-sided sciatica   2. Acute pain of right shoulder   3. Chronic left-sided low back pain with left-sided sciatica     Large Joint Inj: R subacromial bursa on 09/13/2022 9:20 AM Indications: pain and diagnostic evaluation Details: 22 G 1.5 in needle  Arthrogram: No  Medications: 3 mL lidocaine 1 %; 40 mg methylPREDNISolone acetate 40 MG/ML Outcome: tolerated well, no immediate complications Procedure, treatment alternatives, risks and benefits explained, specific risks discussed. Consent was given by the patient. Immediately prior to procedure a time out was called to verify the correct patient, procedure, equipment, support staff and site/side marked as required. Patient was prepped and draped in the usual sterile fashion.

## 2022-09-16 ENCOUNTER — Other Ambulatory Visit: Payer: Self-pay | Admitting: Primary Care

## 2022-09-16 ENCOUNTER — Ambulatory Visit
Admission: RE | Admit: 2022-09-16 | Discharge: 2022-09-16 | Disposition: A | Discharge status: Home / Self Care | Payer: Medicare HMO | Source: Ambulatory Visit | Attending: Student | Admitting: Student

## 2022-09-16 DIAGNOSIS — M25551 Pain in right hip: Secondary | ICD-10-CM

## 2022-09-16 DIAGNOSIS — M549 Dorsalgia, unspecified: Secondary | ICD-10-CM

## 2022-09-19 ENCOUNTER — Other Ambulatory Visit: Payer: Self-pay | Admitting: Primary Care

## 2022-09-28 ENCOUNTER — Other Ambulatory Visit: Payer: Self-pay

## 2022-09-28 ENCOUNTER — Encounter: Payer: Self-pay | Admitting: Orthopaedic Surgery

## 2022-09-28 ENCOUNTER — Ambulatory Visit (INDEPENDENT_AMBULATORY_CARE_PROVIDER_SITE_OTHER): Payer: Medicare HMO | Admitting: Orthopaedic Surgery

## 2022-09-28 DIAGNOSIS — M5441 Lumbago with sciatica, right side: Secondary | ICD-10-CM

## 2022-09-28 DIAGNOSIS — M25511 Pain in right shoulder: Secondary | ICD-10-CM | POA: Diagnosis not present

## 2022-09-28 DIAGNOSIS — M48062 Spinal stenosis, lumbar region with neurogenic claudication: Secondary | ICD-10-CM

## 2022-09-28 DIAGNOSIS — G8929 Other chronic pain: Secondary | ICD-10-CM

## 2022-09-28 DIAGNOSIS — M5442 Lumbago with sciatica, left side: Secondary | ICD-10-CM

## 2022-09-28 NOTE — Progress Notes (Signed)
The patient continues to have significant low back pain with sciatic symptoms going down both legs with the right worse than left.  He has had epidural steroid injections remotely in the past.  It has been quite sometime.  His primary care sent him for an MRI recently and we have this for review today.  Recently also did place a steroid injection in his right shoulder and he said that has helped but he still having some pain.  He is 66 years old.  He is a thin individual.  I believe he is a smoker as well.  He still has a positive straight leg raise on both sides but no weakness in his legs.  He has low back pain with flexion and extension with no blocks to flexion extension.  The pain seems to be somewhat chronic in nature.  The MRI of his lumbar spine does show severe foraminal stenosis bilaterally at L5-S1 that is multifactorial.  He is interested in trying epidural steroid injections again.  Since this does affect both sides we will see if Dr. Alvester Morin can set him up for bilateral L5-S1 epidural steroid injections.  We will work on getting that scheduled.  He can then follow-up as needed after that versus if his symptoms are worsening we will get him an appointment with Dr. Christell Constant here in the office.

## 2022-09-30 ENCOUNTER — Other Ambulatory Visit: Payer: Self-pay | Admitting: Physical Medicine and Rehabilitation

## 2022-09-30 DIAGNOSIS — M5416 Radiculopathy, lumbar region: Secondary | ICD-10-CM

## 2022-10-04 ENCOUNTER — Other Ambulatory Visit: Payer: Self-pay | Admitting: Gastroenterology

## 2022-10-04 ENCOUNTER — Other Ambulatory Visit: Payer: Self-pay

## 2022-10-04 DIAGNOSIS — K7460 Unspecified cirrhosis of liver: Secondary | ICD-10-CM

## 2022-10-17 ENCOUNTER — Ambulatory Visit
Admission: RE | Admit: 2022-10-17 | Discharge: 2022-10-17 | Disposition: A | Discharge status: Home / Self Care | Payer: Medicare HMO | Source: Ambulatory Visit | Attending: Gastroenterology | Admitting: Gastroenterology

## 2022-10-17 DIAGNOSIS — K7460 Unspecified cirrhosis of liver: Secondary | ICD-10-CM

## 2022-10-18 ENCOUNTER — Other Ambulatory Visit: Payer: Self-pay | Admitting: Internal Medicine

## 2022-10-18 DIAGNOSIS — J441 Chronic obstructive pulmonary disease with (acute) exacerbation: Secondary | ICD-10-CM

## 2022-10-19 ENCOUNTER — Other Ambulatory Visit: Payer: Self-pay | Admitting: Gastroenterology

## 2022-10-19 DIAGNOSIS — K7460 Unspecified cirrhosis of liver: Secondary | ICD-10-CM

## 2022-10-20 ENCOUNTER — Encounter: Payer: Medicare HMO | Admitting: Physical Medicine and Rehabilitation

## 2022-10-26 ENCOUNTER — Telehealth: Payer: Self-pay | Admitting: Internal Medicine

## 2022-10-26 MED ORDER — AZITHROMYCIN 250 MG PO TABS: ORAL_TABLET | ORAL | 0 refills | Status: DC

## 2022-10-26 MED ORDER — PREDNISONE 10 MG PO TABS: ORAL_TABLET | ORAL | 0 refills | Status: DC

## 2022-10-26 NOTE — Telephone Encounter (Signed)
Called and spoke to patient that Dr Thora Lance sent in zpak and prednisone taper pack for him. I advised him that Dr Thora Lance wants him to test himself for covid. I advised him that if he is not feeling better by the end of the treatment that was sent in to call for office visit. And I told him that if his SOB worsens to seek care with the ER. He verbalized understanding. Nothing further needed

## 2022-10-26 NOTE — Telephone Encounter (Signed)
Patient called to inform the doctor that he has an appt. Tomorrow but is sick and has a fever.  He said he will cancel the appt. But would like the nurse or doctor to call him in an antibiotic.  Please call patient to discuss further.  CB# [phone]

## 2022-10-26 NOTE — Telephone Encounter (Signed)
Filled prescription for azithromycin, prednisone taper. Agree with rec to test for covid. If no better by end of course needs office visit if stable, if sentence dyspnea, increased O2 requirement or worse may need to head to ED.

## 2022-10-26 NOTE — Telephone Encounter (Signed)
Called and spoke with patient. He stated that he had an appt scheduled with Florentina Addison but he was advised by the front staff to cancel due to his symptoms. He stated that he has not been feeling well for the past 4 days.   He started out with a non-productive cough and headache. On the 2nd day, he developed a fever. He has been battling the fever on and off. His cough is still non-productive. He has also noticed that he has been more SOB, especially with exertion. Increased wheezing.   I asked him if he had tested himself for COVID and he stated that he has not. I explained to him that it would be best for him to test himself based on the symptoms he has. He wasn't sure if he had been around anyone who had been sick recently. He did mention that he had a MRI 4 days ago and his symptoms started afterwards.   He confirmed that he is still using his Trelegy once daily as well as the Daliresp . He also has access to his albuterol HFA and nebulizer solution.   He wanted to know if something could be called in for him.   Pharmacy is [facility] on [address].   Dr. Thora Lance, can you please advise since MR is not available today? Thanks!

## 2022-10-27 ENCOUNTER — Ambulatory Visit: Payer: Medicare HMO | Admitting: Nurse Practitioner

## 2022-10-28 ENCOUNTER — Telehealth: Payer: Self-pay | Admitting: Physical Medicine and Rehabilitation

## 2022-10-28 NOTE — Telephone Encounter (Signed)
Patient would like to reschedule his appointment for his back injection best number to reach him [phone]

## 2022-11-01 ENCOUNTER — Telehealth: Payer: Self-pay | Admitting: Orthopedic Surgery

## 2022-11-01 NOTE — Telephone Encounter (Signed)
Mr. Teuscher is returning Brittney's call to schedule appt with Dr. Ernestina Patches.  He can be reached at [phone] today.

## 2022-11-01 NOTE — Telephone Encounter (Signed)
See previous encounter

## 2022-11-01 NOTE — Telephone Encounter (Signed)
Spoke with patient and rescheduled injection for 11/03/22. Advised that he will need a driver

## 2022-11-01 NOTE — Telephone Encounter (Signed)
LVM #1 to return call to clinic to reschedule injection

## 2022-11-03 ENCOUNTER — Ambulatory Visit: Payer: Self-pay

## 2022-11-03 ENCOUNTER — Ambulatory Visit (INDEPENDENT_AMBULATORY_CARE_PROVIDER_SITE_OTHER): Payer: Medicare HMO | Admitting: Physical Medicine and Rehabilitation

## 2022-11-03 VITALS — BP 111/69 | HR 128

## 2022-11-03 DIAGNOSIS — M5416 Radiculopathy, lumbar region: Secondary | ICD-10-CM | POA: Diagnosis not present

## 2022-11-03 MED ORDER — METHYLPREDNISOLONE ACETATE 80 MG/ML IJ SUSP
80.0000 mg | Freq: Once | INTRAMUSCULAR | Status: AC
  Administered 2022-11-03: 80 mg

## 2022-11-03 NOTE — Patient Instructions (Signed)

## 2022-11-03 NOTE — Progress Notes (Signed)
Functional Pain Scale - descriptive words and definitions  Moderate (4)   Constantly aware of pain, can complete ADLs with modification/sleep marginally affected at times/passive distraction is of no use, but active distraction gives some relief. Moderate range order  Average Pain 5   +Driver, -BT, -Dye Allergies.

## 2022-11-07 ENCOUNTER — Telehealth: Payer: Self-pay | Admitting: Internal Medicine

## 2022-11-07 ENCOUNTER — Telehealth: Payer: Medicare HMO | Admitting: Adult Health

## 2022-11-07 MED ORDER — PREDNISONE 10 MG PO TABS: ORAL_TABLET | ORAL | 0 refills | Status: AC

## 2022-11-07 MED ORDER — DOXYCYCLINE HYCLATE 100 MG PO TABS: 100.0000 mg | ORAL_TABLET | Freq: Two times a day (BID) | ORAL | 0 refills | Status: DC

## 2022-11-07 NOTE — Telephone Encounter (Signed)
He needs to check himself for flu, RSV and COVID -needs to go to her doctor's office for that  If the severity needs to go to the emergency department    Meanwhile   Take doxycycline 100mg  po twice daily x 5 days; take after meals and avoid sunlight   Take prednisone 40 mg daily x 2 days, then 20mg  daily x 2 days, then 10mg  daily x 2 days, then 5mg  daily x 2 days and stop    Allergies  Allergen Reactions   Codeine Nausea And Vomiting

## 2022-11-07 NOTE — Telephone Encounter (Signed)
Spoke with pt and reviewed Dr. Golden Pop recommendations along with medication education. Pt verified pharmacy and stated understanding. Medication orders placed. Nothing further needed at this time.

## 2022-11-07 NOTE — Telephone Encounter (Signed)
Pt states he started getting sick a few days after christmas, he states he had received an antibiotic and that seemed to clear some of the symptoms and was feeling a little better for a couple of days   Pt states now the infection seems to be deeper in his chest. Pt states its hard to exhale and get a good deep breath, pt states he has a fever of 100.2 and 100.4 he has been taking tylenol for the fever. Pt is also having symptoms of wheezing and dry cough, he states he is unable to cough anything up.   Patients pharmacy is [facility] on [address]   Dr. Chase Caller please advise

## 2022-11-08 NOTE — Progress Notes (Signed)
Glen Lopez - 68 y.o. male MRN [id number]  Date of birth: February 01, 1956  Office Visit Note: Visit Date: 11/03/2022 PCP: Cipriano Mile, NP Referred by: Cipriano Mile, NP  Subjective: Chief Complaint  Patient presents with   Lower Back - Pain   HPI:  Glen Lopez is a 67 y.o. male who comes in today at the request of Dr. Jean Rosenthal for planned Right L5-S1 Lumbar Interlaminar epidural steroid injection with fluoroscopic guidance.  The patient has failed conservative care including home exercise, medications, time and activity modification.  This injection will be diagnostic and hopefully therapeutic.  Please see requesting physician notes for further details and justification. Consider facet/medial branch blocks.   ROS Otherwise per HPI.  Assessment & Plan: Visit Diagnoses:    ICD-10-CM   1. Lumbar radiculopathy  M54.16 XR C-ARM NO REPORT    Epidural Steroid injection    methylPREDNISolone acetate (DEPO-MEDROL) injection 80 mg      Plan: No additional findings.   Meds & Orders:  Meds ordered this encounter  Medications   methylPREDNISolone acetate (DEPO-MEDROL) injection 80 mg    Orders Placed This Encounter  Procedures   XR C-ARM NO REPORT   Epidural Steroid injection    Follow-up: Return for visit to requesting provider as needed.   Procedures: No procedures performed  Lumbar Epidural Steroid Injection - Interlaminar Approach with Fluoroscopic Guidance  Patient: Glen Lopez      Date of Birth: Dec 02, 1955 MRN: [id number] PCP: Cipriano Mile, NP      Visit Date: 11/03/2022   Universal Protocol:     Consent Given By: the patient  Position: PRONE  Additional Comments: Vital signs were monitored before and after the procedure. Patient was prepped and draped in the usual sterile fashion. The correct patient, procedure, and site was verified.   Injection Procedure Details:   Procedure diagnoses: Lumbar radiculopathy [M54.16]   Meds  Administered:  Meds ordered this encounter  Medications   methylPREDNISolone acetate (DEPO-MEDROL) injection 80 mg     Laterality: Right  Location/Site:  L5-S1  Needle: 3.5 in., 20 ga. Tuohy  Needle Placement: Paramedian epidural  Findings:   -Comments: Excellent flow of contrast into the epidural space.  Procedure Details: Using a paramedian approach from the side mentioned above, the region overlying the inferior lamina was localized under fluoroscopic visualization and the soft tissues overlying this structure were infiltrated with 4 ml. of 1% Lidocaine without Epinephrine. The Tuohy needle was inserted into the epidural space using a paramedian approach.   The epidural space was localized using loss of resistance along with counter oblique bi-planar fluoroscopic views.  After negative aspirate for air, blood, and CSF, a 2 ml. volume of Isovue-250 was injected into the epidural space and the flow of contrast was observed. Radiographs were obtained for documentation purposes.    The injectate was administered into the level noted above.   Additional Comments:  The patient tolerated the procedure well Dressing: 2 x 2 sterile gauze and Band-Aid    Post-procedure details: Patient was observed during the procedure. Post-procedure instructions were reviewed.  Patient left the clinic in stable condition.   Clinical History: MRI LUMBAR SPINE WITHOUT CONTRAST   TECHNIQUE: Multiplanar, multisequence MR imaging of the lumbar spine was performed. No intravenous contrast was administered.   COMPARISON:  02/23/2009   FINDINGS: Segmentation:  Standard.   Alignment:  Physiologic.   Vertebrae: No acute fracture, evidence of discitis, or aggressive bone lesion.   Conus medullaris and  cauda equina: Conus extends to the T12-L1 level. Conus and cauda equina appear normal.   Paraspinal and other soft tissues: No acute paraspinal abnormality.   Disc levels:   Disc spaces:  Degenerative disease with disc height loss at T11-12, T12-L1, L1-2, L3-4, L4-5. Reactive endplate changes at L5-S1. Small Schmorl's nodes involving the L3, L4 and L5 vertebral bodies.   T12-L1: No significant disc bulge. No neural foraminal stenosis. No central canal stenosis.   L1-L2: No significant disc bulge. No neural foraminal stenosis. No central canal stenosis.   L2-L3: Minimal broad-based disc bulge. No foraminal or central canal stenosis.   L3-L4: Mild broad-based disc bulge. No foraminal or central canal stenosis.   L4-L5: Mild broad-based disc bulge. No spinal stenosis. Mild bilateral foraminal stenosis.   L5-S1: Broad-based disc bulge. Mild bilateral facet arthropathy. Severe bilateral foraminal stenosis. No spinal stenosis.   IMPRESSION: 1. At L5-S1 there is a broad-based disc bulge. Mild bilateral facet arthropathy. Severe bilateral foraminal stenosis. 2. At L4-5 there is a mild broad-based disc bulge. Mild bilateral foraminal stenosis. 3. No acute osseous injury of the lumbar spine.     Electronically Signed   By: Elige Ko M.D.   On: 09/19/2022 07:19     Objective:  VS:  HT:    WT:   BMI:     BP:111/69  HR:(!) 128bpm  TEMP: ( )  RESP:  Physical Exam Vitals and nursing note reviewed.  Constitutional:      General: He is not in acute distress.    Appearance: Normal appearance. He is not ill-appearing.  HENT:     Head: Normocephalic and atraumatic.     Right Ear: External ear normal.     Left Ear: External ear normal.     Nose: No congestion.  Eyes:     Extraocular Movements: Extraocular movements intact.  Cardiovascular:     Rate and Rhythm: Normal rate.     Pulses: Normal pulses.  Pulmonary:     Effort: Pulmonary effort is normal. No respiratory distress.  Abdominal:     General: There is no distension.     Palpations: Abdomen is soft.  Musculoskeletal:        General: No tenderness or signs of injury.     Cervical back: Neck supple.      Right lower leg: No edema.     Left lower leg: No edema.     Comments: Patient has good distal strength without clonus.  Skin:    Findings: No erythema or rash.  Neurological:     General: No focal deficit present.     Mental Status: He is alert and oriented to person, place, and time.     Sensory: No sensory deficit.     Motor: No weakness or abnormal muscle tone.     Coordination: Coordination normal.  Psychiatric:        Mood and Affect: Mood normal.        Behavior: Behavior normal.      Imaging: No results found.

## 2022-11-08 NOTE — Procedures (Signed)
Lumbar Epidural Steroid Injection - Interlaminar Approach with Fluoroscopic Guidance  Patient: Glen Lopez      Date of Birth: [birth year] MRN: [id number] PCP: Cipriano Mile, NP      Visit Date: 11/03/2022   Universal Protocol:     Consent Given By: the patient  Position: PRONE  Additional Comments: Vital signs were monitored before and after the procedure. Patient was prepped and draped in the usual sterile fashion. The correct patient, procedure, and site was verified.   Injection Procedure Details:   Procedure diagnoses: Lumbar radiculopathy [M54.16]   Meds Administered:  Meds ordered this encounter  Medications   methylPREDNISolone acetate (DEPO-MEDROL) injection 80 mg     Laterality: Right  Location/Site:  L5-S1  Needle: 3.5 in., 20 ga. Tuohy  Needle Placement: Paramedian epidural  Findings:   -Comments: Excellent flow of contrast into the epidural space.  Procedure Details: Using a paramedian approach from the side mentioned above, the region overlying the inferior lamina was localized under fluoroscopic visualization and the soft tissues overlying this structure were infiltrated with 4 ml. of 1% Lidocaine without Epinephrine. The Tuohy needle was inserted into the epidural space using a paramedian approach.   The epidural space was localized using loss of resistance along with counter oblique bi-planar fluoroscopic views.  After negative aspirate for air, blood, and CSF, a 2 ml. volume of Isovue-250 was injected into the epidural space and the flow of contrast was observed. Radiographs were obtained for documentation purposes.    The injectate was administered into the level noted above.   Additional Comments:  The patient tolerated the procedure well Dressing: 2 x 2 sterile gauze and Band-Aid    Post-procedure details: Patient was observed during the procedure. Post-procedure instructions were reviewed.  Patient left the clinic in stable  condition.

## 2022-11-16 ENCOUNTER — Other Ambulatory Visit (HOSPITAL_COMMUNITY): Payer: Self-pay

## 2022-11-16 ENCOUNTER — Other Ambulatory Visit: Payer: Self-pay | Admitting: Internal Medicine

## 2022-11-16 ENCOUNTER — Telehealth: Payer: Self-pay | Admitting: Physical Medicine and Rehabilitation

## 2022-11-16 MED ORDER — ALBUTEROL SULFATE HFA 108 (90 BASE) MCG/ACT IN AERS
2.0000 | INHALATION_SPRAY | RESPIRATORY_TRACT | 0 refills | Status: DC | PRN
  Filled 2022-11-16: qty 6.7, 17d supply, fill #0

## 2022-11-16 NOTE — Telephone Encounter (Signed)
Patient called advised the epidural did not work and his back is feeling worse. Patient asked if he can get something for pain. The number to contact patient is [phone]

## 2022-11-17 ENCOUNTER — Other Ambulatory Visit (HOSPITAL_COMMUNITY): Payer: Self-pay

## 2022-11-17 ENCOUNTER — Other Ambulatory Visit: Payer: Self-pay | Admitting: Internal Medicine

## 2022-11-17 ENCOUNTER — Other Ambulatory Visit: Payer: Self-pay | Admitting: Student

## 2022-11-17 NOTE — Telephone Encounter (Signed)
I called patient and advised.  I offered an appt for today even, to get him in sooner, he says he cannot come in sooner, he has so many other appts.  I told him to call back if he can squeeze in an appt time and we would get him on in.  He appreciates call, understands.

## 2022-11-18 ENCOUNTER — Inpatient Hospital Stay: Admission: RE | Admit: 2022-11-18 | Payer: Medicare HMO | Source: Ambulatory Visit

## 2022-11-19 ENCOUNTER — Other Ambulatory Visit: Payer: Medicare HMO

## 2022-11-22 ENCOUNTER — Ambulatory Visit: Payer: Medicare HMO | Admitting: Physical Medicine and Rehabilitation

## 2022-12-08 ENCOUNTER — Ambulatory Visit (INDEPENDENT_AMBULATORY_CARE_PROVIDER_SITE_OTHER): Payer: Medicare HMO | Admitting: Nurse Practitioner

## 2022-12-08 ENCOUNTER — Encounter: Payer: Self-pay | Admitting: Nurse Practitioner

## 2022-12-08 ENCOUNTER — Ambulatory Visit (INDEPENDENT_AMBULATORY_CARE_PROVIDER_SITE_OTHER): Payer: Medicare HMO

## 2022-12-08 VITALS — BP 116/58 | HR 111 | Temp 98.3°F | Ht 65.0 in | Wt 157.6 lb

## 2022-12-08 DIAGNOSIS — J9611 Chronic respiratory failure with hypoxia: Secondary | ICD-10-CM

## 2022-12-08 DIAGNOSIS — J441 Chronic obstructive pulmonary disease with (acute) exacerbation: Secondary | ICD-10-CM | POA: Diagnosis not present

## 2022-12-08 MED ORDER — METHYLPREDNISOLONE ACETATE 80 MG/ML IJ SUSP
80.0000 mg | Freq: Once | INTRAMUSCULAR | Status: AC
  Administered 2022-12-08: 80 mg via INTRAMUSCULAR

## 2022-12-08 MED ORDER — PREDNISONE 10 MG PO TABS: ORAL_TABLET | ORAL | 0 refills | Status: DC

## 2022-12-08 MED ORDER — AMOXICILLIN-POT CLAVULANATE 875-125 MG PO TABS: 1.0000 | ORAL_TABLET | Freq: Two times a day (BID) | ORAL | 0 refills | Status: AC

## 2022-12-08 NOTE — Assessment & Plan Note (Signed)
Unresolved AECOPD. VS stable and non-toxic appearing. CXR today without superimposed infection. Given some improvement with previous augmentin course and time since use >1 month ago, we will treat him with longer duration of empiric augmentin for 10 days. Depo 80 mg inj x 1 today and prednisone taper. Optimize bronchodilator and mucociliary clearance therapies. Close follow up. Strict ED precautions  Patient Instructions  Continue Albuterol inhaler 2 puffs or 3 mL neb every 6 hours as needed for shortness of breath or wheezing. Notify if symptoms persist despite rescue inhaler/neb use. Continue Trelegy 1 puff daily. Brush tongue and rinse mouth afterwards Continue astelin 1-2 sprays each nostril Twice daily Continue flonase nasal spray 2 sprays each nostril daily Continue daliresp 500 mcg daily Continue supplemental oxygen 2 lpm at night. Monitor during the day for goal >88-90%  Prednisone taper. 4 tabs for 3 days, then 3 tabs for 3 days, 2 tabs for 3 days, then 1 tab for 3 days, then stop. Take in AM with food. Start tomorrow  Augmentin 875 mg Twice daily for 10 days. Take with food. Take a daily probiotic while on this or eat yogurt. Guaifenesin 1200 mg Twice daily for chest congestion Use your nebulizer four times a day followed by your flutter valve 10 times  Follow up in 7-10 days with Dr. Chase Caller or Alanson Aly. If symptoms do not improve or worsen, please contact office for sooner follow up or seek emergency care.

## 2022-12-08 NOTE — Assessment & Plan Note (Signed)
Stable on room air today. He will continue to use as needed with activity and at night. Goal >88-90%

## 2022-12-08 NOTE — Progress Notes (Signed)
@Patient  ID: Glen Lopez, male    DOB: 1955-11-12, 67 y.o.   MRN: [id number]  Chief Complaint  Patient presents with   Follow-up    States breathing has gotten worse since last visit. Diagnosed with covid 09/2022 and since then has been having problems. Coughing up phlegm that is yellow tint and also has been wheezing.    Referring provider: Cipriano Mile, NP  HPI: 67 year old male, active smoker followed for severe COPD with chronic bronchitis and chronic respiratory failure. He is a patient of Dr. Golden Pop and last seen in office 08/01/2022 by Volanda Napoleon, NP. Past medical history significant for HTN, GERD, bipolar disorder.  TEST/EVENTS:  09/24/2018 PFT: FVC 57, FEV1 35, ratio 53, TLC 124, DLCOunc 52. Positive BD 11/18/2021 LDCT chest: moderate centrilobular emphysema. Similar lingular and RML scarring with volume loss. Subpleural lymph nodes along left major fissure. Nodule in RLL that was previously of concern has resolved. Other nodules, max 5.3, unchanged. Lung RADS 2. Cholelithiasis. Left nephrolithiasis. Cirrhosis. Atherosclerosis.   08/01/2022: OV with Walsh,NP. 3 month f/u. Doing well. Baseline dyspnea. Developed chest congestion few weeks back - sent in doxycycline and prednisone. Symptoms have cleared. Compliant with Trelegy and Daliresp. Wearing O2 at night; no daytime requirement. F/u 3-4 months. Smoking cessation encouraged.   12/08/2022: Today - acute Patient presents today for acute process. He tells me that he had COVID in December 2023. Since then, he has never felt back to his baseline. He has been having more shortness of breath with activity and a lot of chest congestion. He is having a productive cough with yellow sputum. He did do a course of augmentin in late December/early January, which briefly improved his symptoms but they quickly returned once he was off of this. He then called into our office and was sent doxycycline and prednisone taper. He doesn't feel like this  did much for him. He denies fevers, chills, hemoptysis, leg swelling, orthopnea, URI symptoms. Eating and drinking well. Able to talk in complete sentences. No low oxygen levels at home. He is using his Trelegy, nebs, daliresp, and flutter valve. He is also taking mucinex twice a day.   Allergies  Allergen Reactions   Codeine Nausea And Vomiting    Immunization History  Administered Date(s) Administered   Influenza Split 09/23/2017   Influenza, High Dose Seasonal PF 07/19/2018, 09/30/2021   Influenza, Quadrivalent, Recombinant, Inj, Pf 07/29/2019   Influenza,inj,Quad PF,6+ Mos 09/09/2015, 07/14/2016   Influenza-Unspecified 07/31/2014, 07/31/2020   PFIZER(Purple Top)SARS-COV-2 Vaccination 01/15/2020, 02/16/2020, 08/14/2020   Pneumococcal Conjugate-13 07/19/2018   Pneumococcal Polysaccharide-23 10/31/2013, 07/19/2018    Past Medical History:  Diagnosis Date   Anxiety    Arthritis    Asthma    Bipolar disorder ([place])    COPD (chronic obstructive pulmonary disease) ([place])    Depression    GERD (gastroesophageal reflux disease)    Headache(784.0)    Hiatal hernia    Hypertension     Tobacco History: Social History   Tobacco Use  Smoking Status Every Day   Packs/day: 3.00   Years: 50.00   Total pack years: 150.00   Types: Cigarettes   Passive exposure: Past  Smokeless Tobacco Never  Tobacco Comments   6 cigs per day as of 12/08/22 ep   Ready to quit: Not Answered Counseling given: Not Answered Tobacco comments: 6 cigs per day as of 12/08/22 ep   Outpatient Medications Prior to Visit  Medication Sig Dispense Refill   albuterol (PROVENTIL) (2.5 MG/3ML)  0.083% nebulizer solution Take 3 mLs (2.5 mg total) by nebulization every 6 (six) hours as needed. For shortness of breath 75 mL 5   albuterol (VENTOLIN HFA) 108 (90 Base) MCG/ACT inhaler Inhale 2 puffs into the lungs every 4 (four) hours as needed for shortness of breath 6.7 g 0   ALPRAZolam (XANAX) 0.5 MG tablet Take 1  tablet (0.5 mg total) by mouth 3 (three) times daily as needed for panic attacks 90 tablet 3   amLODipine (NORVASC) 5 MG tablet Take 1 tablet by mouth daily.     aspirin EC 81 MG tablet Take 1 tablet (81 mg total) by mouth daily.     atenolol-chlorthalidone (TENORETIC) 100-25 MG tablet Take 1 tablet by mouth daily. 30 tablet 11   atorvastatin (LIPITOR) 80 MG tablet Take 1 tablet (80 mg total) by mouth daily at 6pm. 30 tablet 11   azelastine (ASTELIN) 0.1 % nasal spray Place 1 spray into both nostrils 2 (two) times daily. Use in each nostril as directed 30 mL 1   carvedilol (COREG) 3.125 MG tablet Take 1 tablet (3.125 mg total) by mouth 2 (two) times daily with food 180 tablet 3   famotidine (PEPCID) 20 MG tablet TAKE 1 TABLET(20 MG) BY MOUTH AT BEDTIME 30 tablet 5   fluticasone (FLONASE) 50 MCG/ACT nasal spray SHAKE LIQUID AND USE 2 SPRAYS(100 MCG) IN EACH NOSTRIL EVERY DAY 16 g 3   lisinopril (ZESTRIL) 5 MG tablet Take 1 tablet (5 mg total) by mouth daily. 90 tablet 3   nitroGLYCERIN (NITROSTAT) 0.4 MG SL tablet Place 1 tablet (0.4 mg) under your tongue for chest pain, every 5 min x3 doses. Call EMS on your second dose. 25 tablet 11   Respiratory Therapy Supplies (FLUTTER) DEVI Use 3 times a day. 1 each 0   roflumilast (DALIRESP) 500 MCG TABS tablet TAKE 1 TABLET(500 MCG) BY MOUTH DAILY 30 tablet 2   rosuvastatin (CRESTOR) 20 MG tablet Take 1 tablet (20 mg total) by mouth at bedtime. 90 tablet 3   sertraline (ZOLOFT) 50 MG tablet Take 1 tablet (50 mg total) by mouth daily. 90 tablet 3   TRELEGY ELLIPTA 200-62.5-25 MCG/ACT AEPB INHALE 1 PUFF INTO THE LUNGS DAILY 60 each 5   gabapentin (NEURONTIN) 300 MG capsule TAKE 1 CAPSULE (300 MG) BY MOUTH 3 TIMES PER DAY FOR 90 DAYS 270 capsule 3   loratadine (CLARITIN) 10 MG tablet TAKE 1 TABLET (10 MG) BY MOUTH ONCE DAILY FOR 30 DAYS 30 tablet 3   azithromycin (ZITHROMAX) 250 MG tablet Two tablets today, then 1 tablet daily till gone 6 tablet 0    doxycycline (VIBRA-TABS) 100 MG tablet Take 1 tablet (100 mg total) by mouth 2 (two) times daily. 14 tablet 0   predniSONE (DELTASONE) 10 MG tablet 4 tabs for 2 days, then 3 tabs for 2 days, 2 tabs for 2 days, then 1 tab for 2 days, then stop 20 tablet 0   predniSONE (DELTASONE) 10 MG tablet Take 4 tabs by mouth for 3 days, then 3 for 3 days, 2 for 3 days, 1 for 3 days and stop 30 tablet 0   Facility-Administered Medications Prior to Visit  Medication Dose Route Frequency Provider Last Rate Last Admin   levalbuterol (XOPENEX) nebulizer solution 0.63 mg  0.63 mg Nebulization Q6H Martyn Ehrich, NP         Review of Systems:   Constitutional: No weight loss or gain, night sweats, fevers, chills, fatigue, or lassitude. HEENT:  No headaches, difficulty swallowing, tooth/dental problems, or sore throat. No sneezing, itching, ear ache, nasal congestion, or post nasal drip CV:  No chest pain, orthopnea, PND, swelling in lower extremities, anasarca, dizziness, palpitations, syncope Resp: +shortness of breath with exertion; chest congestion; productive cough; wheezing.  No hemoptysis. No chest wall deformity GI:  No heartburn, indigestion, abdominal pain, nausea, vomiting, diarrhea, change in bowel habits, loss of appetite, bloody stools.  GU: No dysuria, change in color of urine, urgency or frequency.  Skin: No rash, lesions, ulcerations MSK:  No joint pain or swelling.   Neuro: No dizziness or lightheadedness.  Psych: No depression or anxiety. Mood stable.     Physical Exam:  BP (!) 116/58 (BP Location: Right Arm, Patient Position: Sitting, Cuff Size: Normal)   Pulse (!) 111   Temp 98.3 F (36.8 C) (Oral)   Ht 5\' 5"  (1.651 m)   Wt 157 lb 9.6 oz (71.5 kg)   SpO2 95% Comment: RA  BMI 26.23 kg/m   GEN: Pleasant, interactive, well-appearing; in no acute distress. HEENT:  Normocephalic and atraumatic. PERRLA. Sclera white. Nasal turbinates pink, moist and patent bilaterally. No  rhinorrhea present. Oropharynx pink and moist, without exudate or edema. No lesions, ulcerations, or postnasal drip.  NECK:  Supple w/ fair ROM. No JVD present. Normal carotid impulses w/o bruits. Thyroid symmetrical with no goiter or nodules palpated. No lymphadenopathy.   CV: RRR, no m/r/g, no peripheral edema. Pulses intact, +2 bilaterally. No cyanosis, pallor or clubbing. PULMONARY:  Unlabored, regular breathing. Scattered rhonchi bilaterally A&P. No accessory muscle use.  GI: BS present and normoactive. Soft, non-tender to palpation. No organomegaly or masses detected.  MSK: No erythema, warmth or tenderness. Cap refil <2 sec all extrem. No deformities or joint swelling noted.  Neuro: A/Ox3. No focal deficits noted.   Skin: Warm, no lesions or rashe Psych: Normal affect and behavior. Judgement and thought content appropriate.     Lab Results:  CBC    Component Value Date/Time   WBC 16.3 (H) 12/09/2015 0557   RBC 4.38 12/09/2015 0557   HGB 14.0 12/09/2015 0557   HCT 43.6 12/09/2015 0557   PLT 111 (L) 12/09/2015 0557   MCV 99.5 12/09/2015 0557   MCH 32.0 12/09/2015 0557   MCHC 32.1 12/09/2015 0557   RDW 15.2 12/09/2015 0557   LYMPHSABS 0.7 12/08/2015 0551   MONOABS 0.3 12/08/2015 0551   EOSABS 0.0 12/08/2015 0551   BASOSABS 0.0 12/08/2015 0551    BMET    Component Value Date/Time   NA 139 12/09/2015 0557   K 4.7 12/09/2015 0557   CL 109 12/09/2015 0557   CO2 24 12/09/2015 0557   GLUCOSE 176 (H) 12/09/2015 0557   BUN 17 12/09/2015 0557   CREATININE 0.94 12/09/2015 0557   CALCIUM 8.4 (L) 12/09/2015 0557   GFRNONAA >60 12/09/2015 0557   GFRAA >60 12/09/2015 0557    BNP No results found for: "BNP"   Imaging:  DG Chest 2 View  Result Date: 12/08/2022 CLINICAL DATA:  Productive cough and shortness of breath. EXAM: CHEST - 2 VIEW COMPARISON:  PA and lateral chest 03/22/2022.  CT chest 11/18/2021. FINDINGS: The lungs are emphysematous but clear. Heart size is normal.  Aortic atherosclerosis is seen. No pneumothorax or pleural fluid. No acute or focal bony abnormality. IMPRESSION: No acute disease. Aortic Atherosclerosis (ICD10-I70.0) and Emphysema (ICD10-J43.9). Electronically Signed   By: Inge Rise M.D.   On: 12/08/2022 09:03    methylPREDNISolone acetate (DEPO-MEDROL) injection 80  mg     Date Action Dose Route User   11/03/2022 1045 Given 80 mg Other Tyrell Antonio, MD      methylPREDNISolone acetate (DEPO-MEDROL) injection 80 mg     Date Action Dose Route User   12/08/2022 0932 Given 80 mg Intramuscular (Right Upper Outer Quadrant) Pinion, Farley Ly, CMA          Latest Ref Rng & Units 09/24/2018    8:58 AM  PFT Results  FVC-Pre L 2.18   FVC-Predicted Pre % 57   FVC-Post L 2.59   FVC-Predicted Post % 67   Pre FEV1/FVC % % 47   Post FEV1/FCV % % 53   FEV1-Pre L 1.03   FEV1-Predicted Pre % 35   FEV1-Post L 1.36   DLCO uncorrected ml/min/mmHg 13.12   DLCO UNC% % 52   DLVA Predicted % 61   TLC L 7.32   TLC % Predicted % 124   RV % Predicted % 225     No results found for: "NITRICOXIDE"      Assessment & Plan:   COPD with acute exacerbation (HCC) Unresolved AECOPD. VS stable and non-toxic appearing. CXR today without superimposed infection. Given some improvement with previous augmentin course and time since use >1 month ago, we will treat him with longer duration of empiric augmentin for 10 days. Depo 80 mg inj x 1 today and prednisone taper. Optimize bronchodilator and mucociliary clearance therapies. Close follow up. Strict ED precautions  Patient Instructions  Continue Albuterol inhaler 2 puffs or 3 mL neb every 6 hours as needed for shortness of breath or wheezing. Notify if symptoms persist despite rescue inhaler/neb use. Continue Trelegy 1 puff daily. Brush tongue and rinse mouth afterwards Continue astelin 1-2 sprays each nostril Twice daily Continue flonase nasal spray 2 sprays each nostril daily Continue daliresp  500 mcg daily Continue supplemental oxygen 2 lpm at night. Monitor during the day for goal >88-90%  Prednisone taper. 4 tabs for 3 days, then 3 tabs for 3 days, 2 tabs for 3 days, then 1 tab for 3 days, then stop. Take in AM with food. Start tomorrow  Augmentin 875 mg Twice daily for 10 days. Take with food. Take a daily probiotic while on this or eat yogurt. Guaifenesin 1200 mg Twice daily for chest congestion Use your nebulizer four times a day followed by your flutter valve 10 times  Follow up in 7-10 days with Dr. Marchelle Gearing or Philis Nettle. If symptoms do not improve or worsen, please contact office for sooner follow up or seek emergency care.    Chronic respiratory failure with hypoxia (HCC) Stable on room air today. He will continue to use as needed with activity and at night. Goal >88-90%   I spent 38 minutes of dedicated to the care of this patient on the date of this encounter to include pre-visit review of records, face-to-face time with the patient discussing conditions above, post visit ordering of testing, clinical documentation with the electronic health record, making appropriate referrals as documented, and communicating necessary findings to members of the patients care team.  Noemi Chapel, NP 12/08/2022  Pt aware and understands NP's role.

## 2022-12-08 NOTE — Patient Instructions (Addendum)
Continue Albuterol inhaler 2 puffs or 3 mL neb every 6 hours as needed for shortness of breath or wheezing. Notify if symptoms persist despite rescue inhaler/neb use. Continue Trelegy 1 puff daily. Brush tongue and rinse mouth afterwards Continue astelin 1-2 sprays each nostril Twice daily Continue flonase nasal spray 2 sprays each nostril daily Continue daliresp 500 mcg daily Continue supplemental oxygen 2 lpm at night. Monitor during the day for goal >88-90%  Prednisone taper. 4 tabs for 3 days, then 3 tabs for 3 days, 2 tabs for 3 days, then 1 tab for 3 days, then stop. Take in AM with food. Start tomorrow  Augmentin 875 mg Twice daily for 10 days. Take with food. Take a daily probiotic while on this or eat yogurt. Guaifenesin 1200 mg Twice daily for chest congestion Use your nebulizer four times a day followed by your flutter valve 10 times  Follow up in 7-10 days with Dr. Chase Caller or Alanson Aly. If symptoms do not improve or worsen, please contact office for sooner follow up or seek emergency care.

## 2022-12-12 ENCOUNTER — Ambulatory Visit: Payer: Medicare HMO | Admitting: Internal Medicine

## 2022-12-17 ENCOUNTER — Ambulatory Visit
Admission: RE | Admit: 2022-12-17 | Discharge: 2022-12-17 | Disposition: A | Discharge status: Home / Self Care | Payer: Medicare HMO | Source: Ambulatory Visit | Attending: Gastroenterology | Admitting: Gastroenterology

## 2022-12-17 ENCOUNTER — Other Ambulatory Visit (HOSPITAL_COMMUNITY): Payer: Self-pay

## 2022-12-17 DIAGNOSIS — K7460 Unspecified cirrhosis of liver: Secondary | ICD-10-CM

## 2022-12-17 MED ORDER — GADOPICLENOL 0.5 MMOL/ML IV SOLN
7.0000 mL | Freq: Once | INTRAVENOUS | Status: AC | PRN
  Administered 2022-12-17: 7 mL via INTRAVENOUS

## 2022-12-19 ENCOUNTER — Encounter: Payer: Self-pay | Admitting: Nurse Practitioner

## 2022-12-19 ENCOUNTER — Ambulatory Visit (INDEPENDENT_AMBULATORY_CARE_PROVIDER_SITE_OTHER): Payer: Medicare HMO | Admitting: Nurse Practitioner

## 2022-12-19 VITALS — BP 138/80 | HR 97 | Ht 66.0 in | Wt 164.6 lb

## 2022-12-19 DIAGNOSIS — Z720 Tobacco use: Secondary | ICD-10-CM

## 2022-12-19 DIAGNOSIS — J9611 Chronic respiratory failure with hypoxia: Secondary | ICD-10-CM

## 2022-12-19 DIAGNOSIS — J441 Chronic obstructive pulmonary disease with (acute) exacerbation: Secondary | ICD-10-CM | POA: Diagnosis not present

## 2022-12-19 MED ORDER — PREDNISONE 10 MG PO TABS: ORAL_TABLET | ORAL | 0 refills | Status: DC

## 2022-12-19 NOTE — Progress Notes (Addendum)
$@PatientT$  ID: Glen Lopez, male    DOB: Sep 24, 1956, 67 y.o.   MRN: [id number]  Chief Complaint  Patient presents with   Follow-up    Pt f/u he states he is feeling slightly better. Still doing the pred taper @ 29m    Referring provider: SCipriano Mile NP  HPI: 67year old male, active smoker followed for severe COPD with chronic bronchitis and chronic respiratory failure. He is a patient of Dr. RGolden Popand last seen in office 12/08/2022 by WVolanda Napoleon NP. Past medical history significant for HTN, GERD, bipolar disorder.  TEST/EVENTS:  09/24/2018 PFT: FVC 57, FEV1 35, ratio 53, TLC 124, DLCOunc 52. Positive BD 11/18/2021 LDCT chest: moderate centrilobular emphysema. Similar lingular and RML scarring with volume loss. Subpleural lymph nodes along left major fissure. Nodule in RLL that was previously of concern has resolved. Other nodules, max 5.3, unchanged. Lung RADS 2. Cholelithiasis. Left nephrolithiasis. Cirrhosis. Atherosclerosis.  12/08/2022 CXR: lungs emphysematous but clear   08/01/2022: OV with Walsh,NP. 3 month f/u. Doing well. Baseline dyspnea. Developed chest congestion few weeks back - sent in doxycycline and prednisone. Symptoms have cleared. Compliant with Trelegy and Daliresp. Wearing O2 at night; no daytime requirement. F/u 3-4 months. Smoking cessation encouraged.   12/08/2022: OV with Nakoma Gotwalt NP for acute process. He tells me that he had COVID in December 2023. Since then, he has never felt back to his baseline. He has been having more shortness of breath with activity and a lot of chest congestion. He is having a productive cough with yellow sputum. He did do a course of augmentin in late December/early January, which briefly improved his symptoms but they quickly returned once he was off of this. He then called into our office and was sent doxycycline and prednisone taper. He doesn't feel like this did much for him. He denies fevers, chills, hemoptysis, leg swelling, orthopnea,  URI symptoms. Eating and drinking well. Able to talk in complete sentences. No low oxygen levels at home. He is using his Trelegy, nebs, daliresp, and flutter valve. He is also taking mucinex twice a day.   12/19/2022: Today - follow up Patient presents today for follow up after being treated for unresolved AECOPD with prednisone taper and 10 days of augmentin. He has completed his antibiotics and has two days left of prednisone taper. He tells me he is feeling much better. His breathing is almost back to his baseline; still feels like he gets short winded more easily than usual but overall, doing better. Cough is less frequent; still producing small amount of white to yellow phlegm. Chest no longer feels congested. Denies fevers, chills, hemoptysis. He is using his Trelegy, nebs, daliresp, flutter valve and mucinex.   Allergies  Allergen Reactions   Codeine Nausea And Vomiting    Immunization History  Administered Date(s) Administered   Influenza Split 09/23/2017   Influenza, High Dose Seasonal PF 07/19/2018, 09/30/2021   Influenza, Quadrivalent, Recombinant, Inj, Pf 07/29/2019   Influenza,inj,Quad PF,6+ Mos 09/09/2015, 07/14/2016   Influenza-Unspecified 07/31/2014, 07/31/2020   PFIZER(Purple Top)SARS-COV-2 Vaccination 01/15/2020, 02/16/2020, 08/14/2020   Pneumococcal Conjugate-13 07/19/2018   Pneumococcal Polysaccharide-23 10/31/2013, 07/19/2018    Past Medical History:  Diagnosis Date   Anxiety    Arthritis    Asthma    Bipolar disorder ([place]    COPD (chronic obstructive pulmonary disease) ([place]    Depression    GERD (gastroesophageal reflux disease)    Headache(784.0)    Hiatal hernia    Hypertension  Tobacco History: Social History   Tobacco Use  Smoking Status Every Day   Packs/day: 3.00   Years: 50.00   Total pack years: 150.00   Types: Cigarettes   Passive exposure: Past  Smokeless Tobacco Never  Tobacco Comments   6 cigs per day as of 12/08/22 ep   Ready to  quit: Not Answered Counseling given: Not Answered Tobacco comments: 6 cigs per day as of 12/08/22 ep   Outpatient Medications Prior to Visit  Medication Sig Dispense Refill   albuterol (PROVENTIL) (2.5 MG/3ML) 0.083% nebulizer solution Take 3 mLs (2.5 mg total) by nebulization every 6 (six) hours as needed. For shortness of breath 75 mL 5   albuterol (VENTOLIN HFA) 108 (90 Base) MCG/ACT inhaler Inhale 2 puffs into the lungs every 4 (four) hours as needed for shortness of breath 6.7 g 0   ALPRAZolam (XANAX) 0.5 MG tablet Take 1 tablet (0.5 mg total) by mouth 3 (three) times daily as needed for panic attacks 90 tablet 3   amLODipine (NORVASC) 5 MG tablet Take 1 tablet by mouth daily.     aspirin EC 81 MG tablet Take 1 tablet (81 mg total) by mouth daily.     atenolol-chlorthalidone (TENORETIC) 100-25 MG tablet Take 1 tablet by mouth daily. 30 tablet 11   atorvastatin (LIPITOR) 80 MG tablet Take 1 tablet (80 mg total) by mouth daily at 6pm. 30 tablet 11   azelastine (ASTELIN) 0.1 % nasal spray Place 1 spray into both nostrils 2 (two) times daily. Use in each nostril as directed 30 mL 1   carvedilol (COREG) 3.125 MG tablet Take 1 tablet (3.125 mg total) by mouth 2 (two) times daily with food 180 tablet 3   famotidine (PEPCID) 20 MG tablet TAKE 1 TABLET(20 MG) BY MOUTH AT BEDTIME 30 tablet 5   fluticasone (FLONASE) 50 MCG/ACT nasal spray SHAKE LIQUID AND USE 2 SPRAYS(100 MCG) IN EACH NOSTRIL EVERY DAY 16 g 3   lisinopril (ZESTRIL) 5 MG tablet Take 1 tablet (5 mg total) by mouth daily. 90 tablet 3   nitroGLYCERIN (NITROSTAT) 0.4 MG SL tablet Place 1 tablet (0.4 mg) under your tongue for chest pain, every 5 min x3 doses. Call EMS on your second dose. 25 tablet 11   Respiratory Therapy Supplies (FLUTTER) DEVI Use 3 times a day. 1 each 0   roflumilast (DALIRESP) 500 MCG TABS tablet TAKE 1 TABLET(500 MCG) BY MOUTH DAILY 30 tablet 2   rosuvastatin (CRESTOR) 20 MG tablet Take 1 tablet (20 mg total) by mouth  at bedtime. 90 tablet 3   sertraline (ZOLOFT) 50 MG tablet Take 1 tablet (50 mg total) by mouth daily. 90 tablet 3   TRELEGY ELLIPTA 200-62.5-25 MCG/ACT AEPB INHALE 1 PUFF INTO THE LUNGS DAILY 60 each 5   predniSONE (DELTASONE) 10 MG tablet 4 tabs for 3 days, then 3 tabs for 3 days, 2 tabs for 3 days, then 1 tab for 3 days, then stop 30 tablet 0   gabapentin (NEURONTIN) 300 MG capsule TAKE 1 CAPSULE (300 MG) BY MOUTH 3 TIMES PER DAY FOR 90 DAYS 270 capsule 3   loratadine (CLARITIN) 10 MG tablet TAKE 1 TABLET (10 MG) BY MOUTH ONCE DAILY FOR 30 DAYS 30 tablet 3   Facility-Administered Medications Prior to Visit  Medication Dose Route Frequency Provider Last Rate Last Admin   levalbuterol (XOPENEX) nebulizer solution 0.63 mg  0.63 mg Nebulization Q6H Martyn Ehrich, NP         Review  of Systems:   Constitutional: No weight loss or gain, night sweats, fevers, chills, fatigue, or lassitude. HEENT: No headaches, difficulty swallowing, tooth/dental problems, or sore throat. No sneezing, itching, ear ache, nasal congestion, or post nasal drip CV:  No chest pain, orthopnea, PND, swelling in lower extremities, anasarca, dizziness, palpitations, syncope Resp: +shortness of breath with exertion (improved); productive cough (improved). No wheezing.  No hemoptysis. No chest wall deformity GI:  No heartburn, indigestion, abdominal pain, nausea, vomiting, diarrhea, change in bowel habits, loss of appetite, bloody stools.  GU: No dysuria, change in color of urine, urgency or frequency.  Skin: No rash, lesions, ulcerations MSK:  No joint pain or swelling.   Neuro: No dizziness or lightheadedness.  Psych: No depression or anxiety. Mood stable.     Physical Exam:  BP 138/80   Pulse 97   Ht 5' 6"$  (1.676 m)   Wt 164 lb 9.6 oz (74.7 kg)   SpO2 94%   BMI 26.57 kg/m   GEN: Pleasant, interactive, well-appearing; in no acute distress. HEENT:  Normocephalic and atraumatic. PERRLA. Sclera white. Nasal  turbinates pink, moist and patent bilaterally. No rhinorrhea present. Oropharynx pink and moist, without exudate or edema. No lesions, ulcerations, or postnasal drip.  NECK:  Supple w/ fair ROM. No JVD present. Normal carotid impulses w/o bruits. Thyroid symmetrical with no goiter or nodules palpated. No lymphadenopathy.   CV: RRR, no m/r/g, no peripheral edema. Pulses intact, +2 bilaterally. No cyanosis, pallor or clubbing. PULMONARY:  Unlabored, regular breathing. Clear bilaterally A&P w/o wheezes/rales/rhonchi. No accessory muscle use.  GI: BS present and normoactive. Soft, non-tender to palpation. No organomegaly or masses detected.  MSK: No erythema, warmth or tenderness. Cap refil <2 sec all extrem. No deformities or joint swelling noted.  Neuro: A/Ox3. No focal deficits noted.   Skin: Warm, no lesions or rashe Psych: Normal affect and behavior. Judgement and thought content appropriate.     Lab Results:  CBC    Component Value Date/Time   WBC 16.3 (H) 12/09/2015 0557   RBC 4.38 12/09/2015 0557   HGB 14.0 12/09/2015 0557   HCT 43.6 12/09/2015 0557   PLT 111 (L) 12/09/2015 0557   MCV 99.5 12/09/2015 0557   MCH 32.0 12/09/2015 0557   MCHC 32.1 12/09/2015 0557   RDW 15.2 12/09/2015 0557   LYMPHSABS 0.7 12/08/2015 0551   MONOABS 0.3 12/08/2015 0551   EOSABS 0.0 12/08/2015 0551   BASOSABS 0.0 12/08/2015 0551    BMET    Component Value Date/Time   NA 139 12/09/2015 0557   K 4.7 12/09/2015 0557   CL 109 12/09/2015 0557   CO2 24 12/09/2015 0557   GLUCOSE 176 (H) 12/09/2015 0557   BUN 17 12/09/2015 0557   CREATININE 0.94 12/09/2015 0557   CALCIUM 8.4 (L) 12/09/2015 0557   GFRNONAA >60 12/09/2015 0557   GFRAA >60 12/09/2015 0557    BNP No results found for: "BNP"   Imaging:  MR ABDOMEN WWO CONTRAST  Result Date: 12/19/2022 CLINICAL DATA:  Hepatic cirrhosis. Area of suspected fatty sparing versus is lesion on recent imaging. EXAM: MRI ABDOMEN WITHOUT AND WITH  CONTRAST TECHNIQUE: Multiplanar multisequence MR imaging of the abdomen was performed both before and after the administration of intravenous contrast. CONTRAST:  7 mLVueway COMPARISON:  Abdominal sonogram from 2023 December. FINDINGS: Lower chest: Incidental imaging of the lung bases is unremarkable. Hepatobiliary: Morphologic changes of cirrhosis with nodular contour, caudate hypertrophy and fissural widening. Signs of fatty sparing anterior to the  porta hepatis. Mild background hepatic steatosis. Small enhancing focus in the caudate with suggestion of tiny lesion at 6 mm in the cephalad caudate (image 20/11 and 20/13), no additional focal, suspicious hepatic lesion. Lesion above shows enhancement in the region which is hypervascular with "wash out". Para washout seen only on the portal venous phase image, not seen on more delayed phase images and small size does limit assessment in this location. Portal vein is patent. No pericholecystic stranding or biliary duct dilation. Pancreas: Mild atrophy of the pancreas without ductal dilation, inflammation or visible lesion. Spleen: Moderate splenic iron deposition with smooth splenic contour and normal splenic size. Adrenals/Urinary Tract:  Adrenal glands are normal. Symmetric enhancement of the bilateral kidneys. Caliceal diverticulum of the LEFT kidney containing calculi very similar to 2012, largest calculus is 10 mm with 3 additional 6-7 mm calculi. No signs of inflammation currently. No suspicious renal lesion or perinephric stranding. Stomach/Bowel: Unremarkable to the extent evaluated on this abdominal MRI. Vascular/Lymphatic: Marked atherosclerotic changes of the abdominal aorta without aneurysmal dilation. Patent portal vein. No substantial upper abdominal collaterals. Other:  No ascites Musculoskeletal: No suspicious bone lesions identified. IMPRESSION: 1. Morphologic changes of cirrhosis with mild background hepatic steatosis, area about the porta hepatis  seen on the prior ultrasound and is compatible with fatty sparing. 2. Small enhancing focus in the caudate with suggestion of tiny lesion at 6 mm in the cephalad caudate. Tiny cleat this is LI-RADS category 4 but given the fact that it can be only seen on the portal venous phase as a discrete lesion and given its small size with treat this as a LI-RADS category 3 lesion with close follow-up at 3 months to assess for any changes. The possibility of artifact leading to the appearance of the lesion and transient hepatic intensity differences is considered. 3. Splenic iron deposition. No definitive signs of portal hypertension by MRI. 4. Marked aortic atherosclerosis. 5. LEFT renal caliceal diverticulum in the upper pole with large calculi in the lumen. Electronically Signed   By: Zetta Bills M.D.   On: 12/19/2022 09:10   DG Chest 2 View  Result Date: 12/08/2022 CLINICAL DATA:  Productive cough and shortness of breath. EXAM: CHEST - 2 VIEW COMPARISON:  PA and lateral chest 03/22/2022.  CT chest 11/18/2021. FINDINGS: The lungs are emphysematous but clear. Heart size is normal. Aortic atherosclerosis is seen. No pneumothorax or pleural fluid. No acute or focal bony abnormality. IMPRESSION: No acute disease. Aortic Atherosclerosis (ICD10-I70.0) and Emphysema (ICD10-J43.9). Electronically Signed   By: Inge Rise M.D.   On: 12/08/2022 09:03    methylPREDNISolone acetate (DEPO-MEDROL) injection 80 mg     Date Action Dose Route User   11/03/2022 1045 Given 80 mg Other Magnus Sinning, MD      methylPREDNISolone acetate (DEPO-MEDROL) injection 80 mg     Date Action Dose Route User   12/08/2022 0932 Given 80 mg Intramuscular (Right Upper Outer Quadrant) Pinion, Waldemar Dickens, CMA          Latest Ref Rng & Units 09/24/2018    8:58 AM  PFT Results  FVC-Pre L 2.18   FVC-Predicted Pre % 57   FVC-Post L 2.59   FVC-Predicted Post % 67   Pre FEV1/FVC % % 47   Post FEV1/FCV % % 53   FEV1-Pre L 1.03    FEV1-Predicted Pre % 35   FEV1-Post L 1.36   DLCO uncorrected ml/min/mmHg 13.12   DLCO UNC% % 52   DLVA Predicted %  61   TLC L 7.32   TLC % Predicted % 124   RV % Predicted % 225     No results found for: "NITRICOXIDE"      Assessment & Plan:   COPD with acute exacerbation (HCC) Resolving AECOPD. Clinically improved but still with some residual DOE/cough. He has completed 10 day course of empiric augmentin. We will extend his prednisone taper. Encouraged him to continue with bronchodilator and mucociliary clearance therapies. Strict return precautions. Action plan in place.  Patient Instructions  Continue Albuterol inhaler 2 puffs or 3 mL neb every 6 hours as needed for shortness of breath or wheezing. Notify if symptoms persist despite rescue inhaler/neb use. Continue Trelegy 1 puff daily. Brush tongue and rinse mouth afterwards Continue astelin 1-2 sprays each nostril Twice daily Continue flonase nasal spray 2 sprays each nostril daily Continue daliresp 500 mcg daily Continue supplemental oxygen 2 lpm at night. Monitor during the day for goal >88-90%   Extend prednisone - take 1 tablet for the next 3 days then 1/2 tablet for 5 days then stop. Take in AM with food  Continue Guaifenesin 1200 mg Twice daily for chest congestion Continue to use your nebulizer four times a day followed by your flutter valve 10 times until you feel back to your normal    Follow up in 3 months with Dr. Chase Caller or Alanson Aly. If symptoms do not continue to improve or worsen, please contact office for sooner follow up or seek emergency care.    Chronic respiratory failure with hypoxia (HCC) Stable without increased or daytime O2 requirement. Goal >88-90%  Tobacco use Followed by lung cancer screening program. Last LDCT chest 10/2021. He was scheduled 11/18/2022 but called to cancel. We reached out to the program at his last visit to get him rescheduled. Encouraged him to follow up with this to  get LDCT scheduled.    I spent 32 minutes of dedicated to the care of this patient on the date of this encounter to include pre-visit review of records, face-to-face time with the patient discussing conditions above, post visit ordering of testing, clinical documentation with the electronic health record, making appropriate referrals as documented, and communicating necessary findings to members of the patients care team.  Clayton Bibles, NP 12/19/2022  Pt aware and understands NP's role.

## 2022-12-19 NOTE — Assessment & Plan Note (Signed)
Stable without increased or daytime O2 requirement. Goal >88-90%

## 2022-12-19 NOTE — Assessment & Plan Note (Addendum)
Followed by lung cancer screening program. Last LDCT chest 10/2021. He was scheduled 11/18/2022 but called to cancel. We reached out to the program at his last visit to get him rescheduled. Encouraged him to follow up with this to get LDCT scheduled.

## 2022-12-19 NOTE — Patient Instructions (Addendum)
Continue Albuterol inhaler 2 puffs or 3 mL neb every 6 hours as needed for shortness of breath or wheezing. Notify if symptoms persist despite rescue inhaler/neb use. Continue Trelegy 1 puff daily. Brush tongue and rinse mouth afterwards Continue astelin 1-2 sprays each nostril Twice daily Continue flonase nasal spray 2 sprays each nostril daily Continue daliresp 500 mcg daily Continue supplemental oxygen 2 lpm at night. Monitor during the day for goal >88-90%   Extend prednisone - take 1 tablet for the next 3 days then 1/2 tablet for 5 days then stop. Take in AM with food  Continue Guaifenesin 1200 mg Twice daily for chest congestion Continue to use your nebulizer four times a day followed by your flutter valve 10 times until you feel back to your normal    Follow up in 3 months with Dr. Chase Caller or Alanson Aly. If symptoms do not continue to improve or worsen, please contact office for sooner follow up or seek emergency care.

## 2022-12-19 NOTE — Assessment & Plan Note (Signed)
Resolving AECOPD. Clinically improved but still with some residual DOE/cough. He has completed 10 day course of empiric augmentin. We will extend his prednisone taper. Encouraged him to continue with bronchodilator and mucociliary clearance therapies. Strict return precautions. Action plan in place.  Patient Instructions  Continue Albuterol inhaler 2 puffs or 3 mL neb every 6 hours as needed for shortness of breath or wheezing. Notify if symptoms persist despite rescue inhaler/neb use. Continue Trelegy 1 puff daily. Brush tongue and rinse mouth afterwards Continue astelin 1-2 sprays each nostril Twice daily Continue flonase nasal spray 2 sprays each nostril daily Continue daliresp 500 mcg daily Continue supplemental oxygen 2 lpm at night. Monitor during the day for goal >88-90%   Extend prednisone - take 1 tablet for the next 3 days then 1/2 tablet for 5 days then stop. Take in AM with food  Continue Guaifenesin 1200 mg Twice daily for chest congestion Continue to use your nebulizer four times a day followed by your flutter valve 10 times until you feel back to your normal    Follow up in 3 months with Dr. Chase Caller or Alanson Aly. If symptoms do not continue to improve or worsen, please contact office for sooner follow up or seek emergency care.

## 2022-12-21 ENCOUNTER — Other Ambulatory Visit: Payer: Self-pay | Admitting: Gastroenterology

## 2022-12-21 DIAGNOSIS — K7460 Unspecified cirrhosis of liver: Secondary | ICD-10-CM

## 2022-12-21 DIAGNOSIS — K769 Liver disease, unspecified: Secondary | ICD-10-CM

## 2023-01-14 ENCOUNTER — Ambulatory Visit
Admission: RE | Admit: 2023-01-14 | Discharge: 2023-01-14 | Disposition: A | Discharge status: Home / Self Care | Payer: Medicare HMO | Source: Ambulatory Visit | Attending: Gastroenterology | Admitting: Gastroenterology

## 2023-01-14 DIAGNOSIS — K769 Liver disease, unspecified: Secondary | ICD-10-CM

## 2023-01-14 DIAGNOSIS — K7460 Unspecified cirrhosis of liver: Secondary | ICD-10-CM

## 2023-01-14 MED ORDER — GADOPICLENOL 0.5 MMOL/ML IV SOLN
7.5000 mL | Freq: Once | INTRAVENOUS | Status: AC | PRN
  Administered 2023-01-14: 7.5 mL via INTRAVENOUS

## 2023-01-24 ENCOUNTER — Other Ambulatory Visit: Payer: Self-pay | Admitting: Internal Medicine

## 2023-01-24 DIAGNOSIS — J441 Chronic obstructive pulmonary disease with (acute) exacerbation: Secondary | ICD-10-CM

## 2023-02-12 ENCOUNTER — Other Ambulatory Visit: Payer: Self-pay | Admitting: Internal Medicine

## 2023-02-13 ENCOUNTER — Other Ambulatory Visit (HOSPITAL_COMMUNITY): Payer: Self-pay

## 2023-02-13 ENCOUNTER — Other Ambulatory Visit: Payer: Self-pay | Admitting: *Deleted

## 2023-02-13 MED ORDER — ALBUTEROL SULFATE HFA 108 (90 BASE) MCG/ACT IN AERS: 2.0000 | INHALATION_SPRAY | RESPIRATORY_TRACT | 3 refills | Status: DC | PRN

## 2023-03-07 ENCOUNTER — Other Ambulatory Visit (HOSPITAL_COMMUNITY): Payer: Self-pay

## 2023-03-20 ENCOUNTER — Ambulatory Visit (INDEPENDENT_AMBULATORY_CARE_PROVIDER_SITE_OTHER): Payer: Medicare HMO | Admitting: Nurse Practitioner

## 2023-03-20 ENCOUNTER — Encounter: Payer: Self-pay | Admitting: Nurse Practitioner

## 2023-03-20 VITALS — BP 140/70 | HR 91 | Temp 98.1°F | Ht 65.0 in | Wt 166.6 lb

## 2023-03-20 DIAGNOSIS — Z720 Tobacco use: Secondary | ICD-10-CM

## 2023-03-20 DIAGNOSIS — J9611 Chronic respiratory failure with hypoxia: Secondary | ICD-10-CM

## 2023-03-20 DIAGNOSIS — J449 Chronic obstructive pulmonary disease, unspecified: Secondary | ICD-10-CM

## 2023-03-20 NOTE — Patient Instructions (Addendum)
Continue Albuterol inhaler 2 puffs or 3 mL neb every 6 hours as needed for shortness of breath or wheezing. Notify if symptoms persist despite rescue inhaler/neb use. Use nebs twice a day, before your inhaler and follow with your flutter valve if having chest congestion Stop Trelegy. Start Breztri 2 puffs Twice daily. Brush tongue and rinse mouth afterwards. If you don't notice an improvement with your breathing, you can go back to your Trelegy  Continue astelin 1-2 sprays each nostril Twice daily Continue flonase nasal spray 2 sprays each nostril daily Continue daliresp 500 mcg daily Continue supplemental oxygen 2 lpm at night. Monitor during the day for goal >88-90% Continue Guaifenesin 1200 mg Twice daily for chest congestion  Need to reschedule your lung cancer screening CT - I will send the program a message to coordinate this    Follow up in 6 weeks with Glen Lopez (1st) or Glen Brelyn Woehl,NP to see how the new inhaler is going. If symptoms do not continue to improve or worsen, please contact office for sooner follow up or seek emergency care.

## 2023-03-20 NOTE — Progress Notes (Signed)
@Patient  ID: Glen Lopez, male    DOB: 03/14/1956, 67 y.o.   MRN: [id number]  Chief Complaint  Patient presents with   Follow-up    Pt sates no concerns.     Referring provider: Hillery Aldo, NP  HPI: 67 year old male, active smoker followed for severe COPD with chronic bronchitis and chronic respiratory failure. He is a patient of Dr. Jane Canary and last seen in office 12/19/2022 by Allison Quarry, NP. Past medical history significant for HTN, GERD, bipolar disorder.  TEST/EVENTS:  09/24/2018 PFT: FVC 57, FEV1 35, ratio 53, TLC 124, DLCOunc 52. Positive BD 11/18/2021 LDCT chest: moderate centrilobular emphysema. Similar lingular and RML scarring with volume loss. Subpleural lymph nodes along left major fissure. Nodule in RLL that was previously of concern has resolved. Other nodules, max 5.3, unchanged. Lung RADS 2. Cholelithiasis. Left nephrolithiasis. Cirrhosis. Atherosclerosis.  12/08/2022 CXR: lungs emphysematous but clear   08/01/2022: OV with Walsh,NP. 3 month f/u. Doing well. Baseline dyspnea. Developed chest congestion few weeks back - sent in doxycycline and prednisone. Symptoms have cleared. Compliant with Trelegy and Daliresp. Wearing O2 at night; no daytime requirement. F/u 3-4 months. Smoking cessation encouraged.   12/08/2022: OV with Abrie Egloff NP for acute process. He tells me that he had COVID in December 2023. Since then, he has never felt back to his baseline. He has been having more shortness of breath with activity and a lot of chest congestion. He is having a productive cough with yellow sputum. He did do a course of augmentin in late December/early January, which briefly improved his symptoms but they quickly returned once he was off of this. He then called into our office and was sent doxycycline and prednisone taper. He doesn't feel like this did much for him. He denies fevers, chills, hemoptysis, leg swelling, orthopnea, URI symptoms. Eating and drinking well. Able to talk in  complete sentences. No low oxygen levels at home. He is using his Trelegy, nebs, daliresp, and flutter valve. He is also taking mucinex twice a day.   12/19/2022: OV with Nnenna Meador NP for follow up after being treated for unresolved AECOPD with prednisone taper and 10 days of augmentin. He has completed his antibiotics and has two days left of prednisone taper. He tells me he is feeling much better. His breathing is almost back to his baseline; still feels like he gets short winded more easily than usual but overall, doing better. Cough is less frequent; still producing small amount of white to yellow phlegm. Chest no longer feels congested. Denies fevers, chills, hemoptysis. He is using his Trelegy, nebs, daliresp, flutter valve and mucinex.   03/20/2023: Today - follow up Patient presents today for follow up. He has been doing relatively the same as his last visit. He still uses his albuterol frequently. Gets winded with household chores and uphill climbing. His cough is at his baseline. Occasionally productive with white to yellow phlegm. Doesn't notice much wheezing. No increased chest congestion. He did go to his PCP last week who said his heart rhythm sounded abnormal. He's currently wearing a monitor with plans to return next week. He hasn't noticed any palpitations today. Denies any chest pain, lightheadedness, dizziness, syncope, leg swelling. He's still smoking 3-4 cigarettes a day.   Allergies  Allergen Reactions   Codeine Nausea And Vomiting    Immunization History  Administered Date(s) Administered   Influenza Split 09/23/2017   Influenza, High Dose Seasonal PF 07/19/2018, 09/30/2021   Influenza, Quadrivalent, Recombinant, Inj, Pf  07/29/2019   Influenza,inj,Quad PF,6+ Mos 09/09/2015, 07/14/2016   Influenza-Unspecified 07/31/2014, 07/31/2020   PFIZER(Purple Top)SARS-COV-2 Vaccination 01/15/2020, 02/16/2020, 08/14/2020   Pneumococcal Conjugate-13 07/19/2018   Pneumococcal Polysaccharide-23  10/31/2013, 07/19/2018    Past Medical History:  Diagnosis Date   Anxiety    Arthritis    Asthma    Bipolar disorder (HCC)    COPD (chronic obstructive pulmonary disease) (HCC)    Depression    GERD (gastroesophageal reflux disease)    Headache(784.0)    Hiatal hernia    Hypertension     Tobacco History: Social History   Tobacco Use  Smoking Status Every Day   Packs/day: 3.00   Years: 50.00   Additional pack years: 0.00   Total pack years: 150.00   Types: Cigarettes   Passive exposure: Past  Smokeless Tobacco Never  Tobacco Comments   6 cigs per day as of 12/08/22 ep   Ready to quit: Not Answered Counseling given: Not Answered Tobacco comments: 6 cigs per day as of 12/08/22 ep   Outpatient Medications Prior to Visit  Medication Sig Dispense Refill   albuterol (PROVENTIL) (2.5 MG/3ML) 0.083% nebulizer solution Take 3 mLs (2.5 mg total) by nebulization every 6 (six) hours as needed. For shortness of breath 75 mL 5   albuterol (VENTOLIN HFA) 108 (90 Base) MCG/ACT inhaler Inhale 2 puffs into the lungs every 4 (four) hours as needed for shortness of breath 18 g 3   ALPRAZolam (XANAX) 0.5 MG tablet Take 1 tablet (0.5 mg total) by mouth 3 (three) times daily as needed for panic attacks 90 tablet 3   amLODipine (NORVASC) 5 MG tablet Take 1 tablet by mouth daily.     aspirin EC 81 MG tablet Take 1 tablet (81 mg total) by mouth daily.     atenolol-chlorthalidone (TENORETIC) 100-25 MG tablet Take 1 tablet by mouth daily. 30 tablet 11   atorvastatin (LIPITOR) 80 MG tablet Take 1 tablet (80 mg total) by mouth daily at 6pm. 30 tablet 11   azelastine (ASTELIN) 0.1 % nasal spray Place 1 spray into both nostrils 2 (two) times daily. Use in each nostril as directed 30 mL 1   carvedilol (COREG) 3.125 MG tablet Take 1 tablet (3.125 mg total) by mouth 2 (two) times daily with food 180 tablet 3   famotidine (PEPCID) 20 MG tablet TAKE 1 TABLET(20 MG) BY MOUTH AT BEDTIME 30 tablet 5    fluticasone (FLONASE) 50 MCG/ACT nasal spray SHAKE LIQUID AND USE 2 SPRAYS(100 MCG) IN EACH NOSTRIL EVERY DAY 16 g 3   lisinopril (ZESTRIL) 5 MG tablet Take 1 tablet (5 mg total) by mouth daily. 90 tablet 3   nitroGLYCERIN (NITROSTAT) 0.4 MG SL tablet Place 1 tablet (0.4 mg) under your tongue for chest pain, every 5 min x3 doses. Call EMS on your second dose. 25 tablet 11   predniSONE (DELTASONE) 10 MG tablet Take 1 tablet for 3 days then 1/2 tablet for 5 days then stop 5 tablet 0   Respiratory Therapy Supplies (FLUTTER) DEVI Use 3 times a day. 1 each 0   roflumilast (DALIRESP) 500 MCG TABS tablet TAKE 1 TABLET(500 MCG) BY MOUTH DAILY 30 tablet 2   rosuvastatin (CRESTOR) 20 MG tablet Take 1 tablet (20 mg total) by mouth at bedtime. 90 tablet 3   sertraline (ZOLOFT) 50 MG tablet Take 1 tablet (50 mg total) by mouth daily. 90 tablet 3   TRELEGY ELLIPTA 200-62.5-25 MCG/ACT AEPB INHALE 1 PUFF INTO THE LUNGS DAILY 60  each 5   gabapentin (NEURONTIN) 300 MG capsule TAKE 1 CAPSULE (300 MG) BY MOUTH 3 TIMES PER DAY FOR 90 DAYS 270 capsule 3   loratadine (CLARITIN) 10 MG tablet TAKE 1 TABLET (10 MG) BY MOUTH ONCE DAILY FOR 30 DAYS 30 tablet 3   Facility-Administered Medications Prior to Visit  Medication Dose Route Frequency Provider Last Rate Last Admin   levalbuterol (XOPENEX) nebulizer solution 0.63 mg  0.63 mg Nebulization Q6H [name], NP         Review of Systems:   Constitutional: No weight loss or gain, night sweats, fevers, chills, fatigue, or lassitude. HEENT: No headaches, difficulty swallowing, tooth/dental problems, or sore throat. No sneezing, itching, ear ache, nasal congestion, or post nasal drip CV:  No chest pain, orthopnea, PND, swelling in lower extremities, anasarca, dizziness, palpitations, syncope Resp: +shortness of breath with exertion; productive cough (daily). No wheezing.  No hemoptysis. No chest wall deformity GI:  No heartburn, indigestion, abdominal pain,  nausea, vomiting, diarrhea, change in bowel habits, loss of appetite, bloody stools.  GU: No dysuria, change in color of urine, urgency or frequency.  Skin: No rash, lesions, ulcerations MSK:  No joint pain or swelling.   Neuro: No dizziness or lightheadedness.  Psych: No depression or anxiety. Mood stable.     Physical Exam:  BP (!) 140/70   Pulse 91   Temp 98.1 F (36.7 C) (Oral)   Ht 5\' 5"  (1.651 m)   Wt 166 lb 9.6 oz (75.6 kg)   SpO2 93%   BMI 27.72 kg/m   GEN: Pleasant, interactive, well-appearing; in no acute distress. HEENT:  Normocephalic and atraumatic. PERRLA. Sclera white. Nasal turbinates pink, moist and patent bilaterally. No rhinorrhea present. Oropharynx pink and moist, without exudate or edema. No lesions, ulcerations, or postnasal drip.  NECK:  Supple w/ fair ROM. No JVD present. Normal carotid impulses w/o bruits. Thyroid symmetrical with no goiter or nodules palpated. No lymphadenopathy.   CV: RRR, no m/r/g, no peripheral edema. Pulses intact, +2 bilaterally. No cyanosis, pallor or clubbing. PULMONARY:  Unlabored, regular breathing. Minimal scattered rhonchi bilaterally A&P. No accessory muscle use.  GI: BS present and normoactive. Soft, non-tender to palpation. No organomegaly or masses detected.  MSK: No erythema, warmth or tenderness. Cap refil <2 sec all extrem. No deformities or joint swelling noted.  Neuro: A/Ox3. No focal deficits noted.   Skin: Warm, no lesions or rashe Psych: Normal affect and behavior. Judgement and thought content appropriate.     Lab Results:  CBC    Component Value Date/Time   WBC 16.3 (H) 12/09/2015 0557   RBC 4.38 12/09/2015 0557   HGB 14.0 12/09/2015 0557   HCT 43.6 12/09/2015 0557   PLT 111 (L) 12/09/2015 0557   MCV 99.5 12/09/2015 0557   MCH 32.0 12/09/2015 0557   MCHC 32.1 12/09/2015 0557   RDW 15.2 12/09/2015 0557   LYMPHSABS 0.7 12/08/2015 0551   MONOABS 0.3 12/08/2015 0551   EOSABS 0.0 12/08/2015 0551    BASOSABS 0.0 12/08/2015 0551    BMET    Component Value Date/Time   NA 139 12/09/2015 0557   K 4.7 12/09/2015 0557   CL 109 12/09/2015 0557   CO2 24 12/09/2015 0557   GLUCOSE 176 (H) 12/09/2015 0557   BUN 17 12/09/2015 0557   CREATININE 0.94 12/09/2015 0557   CALCIUM 8.4 (L) 12/09/2015 0557   GFRNONAA >60 12/09/2015 0557   GFRAA >60 12/09/2015 0557    BNP No results  found for: "BNP"   Imaging:  No results found.        Latest Ref Rng & Units 09/24/2018    8:58 AM  PFT Results  FVC-Pre L 2.18   FVC-Predicted Pre % 57   FVC-Post L 2.59   FVC-Predicted Post % 67   Pre FEV1/FVC % % 47   Post FEV1/FCV % % 53   FEV1-Pre L 1.03   FEV1-Predicted Pre % 35   FEV1-Post L 1.36   DLCO uncorrected ml/min/mmHg 13.12   DLCO UNC% % 52   DLVA Predicted % 61   TLC L 7.32   TLC % Predicted % 124   RV % Predicted % 225     No results found for: "NITRICOXIDE"      Assessment & Plan:   COPD, severe (HCC) Severe COPD. He continues to require frequently use of SABA. Does not appear to be in acute exacerbation today. We will trial change from Trelegy to [facility]. Possible he is unable to generate the airflow for the DPI. He will continue daliresp daily. Could consider referral to pulmonary rehab, which we will discuss at follow up. Smoking cessation again advised. He will continue mucociliary clearance therapies. Action plan in place.  Patient Instructions  Continue Albuterol inhaler 2 puffs or 3 mL neb every 6 hours as needed for shortness of breath or wheezing. Notify if symptoms persist despite rescue inhaler/neb use. Use nebs twice a day, before your inhaler and follow with your flutter valve if having chest congestion Stop Trelegy. Start Breztri 2 puffs Twice daily. Brush tongue and rinse mouth afterwards. If you don't notice an improvement with your breathing, you can go back to your Trelegy  Continue astelin 1-2 sprays each nostril Twice daily Continue flonase nasal spray  2 sprays each nostril daily Continue daliresp 500 mcg daily Continue supplemental oxygen 2 lpm at night. Monitor during the day for goal >88-90% Continue Guaifenesin 1200 mg Twice daily for chest congestion  Need to reschedule your lung cancer screening CT - I will send the program a message to coordinate this    Follow up in 6 weeks with Dr. Marchelle Gearing (1st) or Katie Argenis Kumari,NP to see how the new inhaler is going. If symptoms do not continue to improve or worsen, please contact office for sooner follow up or seek emergency care.    Chronic respiratory failure with hypoxia (HCC) No increased oxygen requirement. He is able to maintain saturations on room air with exertion/rest. He will continue supplemental oxygen at night. Goal >88-90%  Tobacco use Missed his LDCT chest in January 2024 due to illness. Lung RADS 2 in January 2023. Will send a message to the lung cancer screening program to get him rescheduled.    I spent 32 minutes of dedicated to the care of this patient on the date of this encounter to include pre-visit review of records, face-to-face time with the patient discussing conditions above, post visit ordering of testing, clinical documentation with the electronic health record, making appropriate referrals as documented, and communicating necessary findings to members of the patients care team.  Noemi Chapel, NP 03/20/2023  Pt aware and understands NP's role.

## 2023-03-20 NOTE — Assessment & Plan Note (Signed)
Missed his LDCT chest in January 2024 due to illness. Lung RADS 2 in January 2023. Will send a message to the lung cancer screening program to get him rescheduled.

## 2023-03-20 NOTE — Assessment & Plan Note (Addendum)
Severe COPD. He continues to require frequently use of SABA. Does not appear to be in acute exacerbation today. We will trial change from Trelegy to [facility]. Possible he is unable to generate the airflow for the DPI. He will continue daliresp daily. Could consider referral to pulmonary rehab, which we will discuss at follow up. Smoking cessation again advised. He will continue mucociliary clearance therapies. Action plan in place.  Patient Instructions  Continue Albuterol inhaler 2 puffs or 3 mL neb every 6 hours as needed for shortness of breath or wheezing. Notify if symptoms persist despite rescue inhaler/neb use. Use nebs twice a day, before your inhaler and follow with your flutter valve if having chest congestion Stop Trelegy. Start Breztri 2 puffs Twice daily. Brush tongue and rinse mouth afterwards. If you don't notice an improvement with your breathing, you can go back to your Trelegy  Continue astelin 1-2 sprays each nostril Twice daily Continue flonase nasal spray 2 sprays each nostril daily Continue daliresp 500 mcg daily Continue supplemental oxygen 2 lpm at night. Monitor during the day for goal >88-90% Continue Guaifenesin 1200 mg Twice daily for chest congestion  Need to reschedule your lung cancer screening CT - I will send the program a message to coordinate this    Follow up in 6 weeks with Dr. Marchelle Gearing (1st) or Katie Taneya Conkel,NP to see how the new inhaler is going. If symptoms do not continue to improve or worsen, please contact office for sooner follow up or seek emergency care.

## 2023-03-20 NOTE — Assessment & Plan Note (Signed)
No increased oxygen requirement. He is able to maintain saturations on room air with exertion/rest. He will continue supplemental oxygen at night. Goal >88-90%

## 2023-03-21 ENCOUNTER — Other Ambulatory Visit: Payer: Self-pay | Admitting: *Deleted

## 2023-03-21 DIAGNOSIS — Z87891 Personal history of nicotine dependence: Secondary | ICD-10-CM

## 2023-03-21 DIAGNOSIS — F17210 Nicotine dependence, cigarettes, uncomplicated: Secondary | ICD-10-CM

## 2023-03-21 DIAGNOSIS — Z122 Encounter for screening for malignant neoplasm of respiratory organs: Secondary | ICD-10-CM

## 2023-03-30 NOTE — Progress Notes (Signed)
 [facility], [place] CARDIOLOGY OFFICE NOTE   03/30/2023             PCP:  Sun, Vyvyan Ye-Lin, MD Referring Provider:  Sun, Vyvyan Ye-Lin, MD  Return Patient.  He saw Dr. Toribio in May 2022.  I saw him Jan 2023.     Chief Complaint  Patient presents with  . Follow-up    yearly  . Coronary Artery Disease    Inferior STEMI 04/2019 w DES to mCFX into OM, EF 45-50%.  CAD, inferior STEMI 04/2019 w DES to mCFX into OM, EF 45-50%.   Subjective:   Background/HPI:          (11/25/2021) Glen Lopez is a 67  yo male with CAD, inferior STEMI 04/2019 w DES to mCFX/OM, EF 45-50%, HTN, HLD, COPD, who was to follow-up in 1 year after seeing Dr. Toribio in May 2022. Dr. Blondie note from 03/12/2021:   1.  Coronary artery disease with acute inferior wall STEMI, June 2020.  In the Cath Lab, the culprit lesion was the mid circumflex.  This was treated with a drug-eluting stent extending from the mid circumflex into an obtuse marginal.  The ongoing circumflex was treated with kissing balloon angioplasty with a good result.    His echo EF was 45 to 50%.  He is recovered very well.  He has no heart failure signs or symptoms.  He took nitroglycerin  once in the past year, but does not have angina routinely.  He is able to exercise without limitation.  I advised him to continue the same medicines, which include aspirin , atenolol , atorvastatin , and lisinopril . We discussed the importance of continuing regular exercise. 2.  Essential hypertension.  Well-controlled.  This is managed by his PCP. 3.  Tobacco abuse.  He previously smoked about 2 packs a day and has cut back to about 5 cigarettes a day.  I congratulated him for this but encouraged him to continue to try to quit smoking completely.  He tells me that he will continue to work on it. 4.  COPD.  This is managed by his PCP. 5.  Hyperlipidemia.    The target LDL is less than 70.  He takes atorvastatin  80 mg daily with  no side effects.  He tells me that his PCP is checking his cholesterol routinely and has told him that it is well controlled.   ORDERS PLACED TODAY:  1.  None   FOLLOW UP:  The patient will come back to see me in 12 months, sooner if needed.   He has followed with [facility] for Stage 3 severe COPD.   11/25/2021          (Initial clinic visit with me)                         BP: 125/72    HR: 105 ECG: Not done CV-related meds: aspirin  81, atenolol -chlorthalidone  100-25, atorvastatin  80, lisinopril  5.  Glen Lopez states that he is doing well.  He has had no cardiac symptoms.  No CP, PND, orthopnea, peripheral edema, palpitations, dizziness, presyncope, syncope.  He is still smoking and still has some shortness of breath due to his stage III COPD.  He uses O2 at night.  BP has been under good control.  He is not really sure about his medications but seems to recognize the names of atenolol , atorvastatin , and lisinopril  when we reviewed the list.  He states his primary care physician has him on calcium  and other medications.  He does continue on albuterol , Symbicort , and Trelegy Ellipta  for his COPD. He has no complaints today and feels that he is doing well.  After he left, I had questions about his meds and we call but were unable to reach him.  Ultimately, I reordered atenolol /chlorthalidone  and atorvastatin  80 and discontinued carvedilol  and rosuvastatin .  We asked the pharmacy to have him call us  when he picked up his prescriptions.  In June, [facility] called asking for a clarification on medication - was he supposed to take atenolol  and carvedilol ?  No.   He has had several visits with [facility].  He was seen at [facility] ED on 09/01/2022 after a fall injuring his R shoulder when he tripped over a cable while walking his dog. The ED note indicates that he was on atenolol /chlorthalidone  as well as carvedilol .  Shoulder film was negative for fracture or  dislocation.  Telephone note with pulmonary 11/07/2022 indicates he had chest congestion was treated with doxycycline  and prednisone .   12/01/2022: (2nd clinic visit) BP: 126/86   HR: 82 ECG: Not done CV-related meds: , aspirin  81, atenolol /chlorthalidone  (Tenoretic ) 100/25 daily, atorvastatin  80 Q PM,  - needs Rx, nitrostat .  Carvedilol ? - no.  Glen Lopez came to clinic today alone.  He states he is doing well.  He did not realize that we were trying to contact him.  The first time he heard about it was when the pharmacy had a question about his medications. He states that he has occasional chest tightness.  He is under stress raising his grandchildren.  It happens rarely.  He has taken nitroglycerin  for it one time.  He states his blood pressure has been under good control.  He is chronically short of breath due to COPD.  He has been having night sweats lately and is going to follow-up with his PCP regarding this.  No diabetes.  He states he also has a gallbladder problem.  He is trying to stop smoking and is down from 3 packs a day to 5 to 6 cigarettes/day. We reviewed his medications.  He is not taking amlodipine .  He is taking atenolol /chlorthalidone , atorvastatin .  He is off of lisinopril .  He had reduced LV function in the past with EF 45-50%.  We will restart the lisinopril .  He is not taking carvedilol  and we will DC that prescription so that it will not be confusing. He will follow-up with me in 3 months.  If he is still having chest discomfort we will prescribe Imdur and plus or do stress testing.  At some point I would like to do another echo to reevaluate his LV function.  The last one was in 2020.   He was doing well.  Rare chest discomfort.  BP well-controlled.  We continued aspirin , atorvastatin , atenolol .  EF 45-50% by echo 04/08/2019 so we restarted lisinopril  and plan to recheck an echo in the next few months.  03/30/2023 BP: BP 132/79   HR: 85 ECG: Not done CV-related meds:  Aspirin  81, atenolol /chlorthalidone  100/25 daily, atorvastatin  80 daily, lisinopril  5 daily, Nitrostat .  Glen Lopez came to clinic today alone.  While he was waiting for me he received a call that his grandson, of whom he has custody because the father died recently, had hurt his leg on the ballfield.  He was able to stay for the visit and then left.  We will need to call  him to schedule echo and f/u appt. Glen Lopez states that he has been doing very well.  He had chest pain about 2 weeks ago while walking the dog.  It lasted less than a minute.  He did not have to take nitro.  He thinks maybe it was gas.  He has had no further chest pain. He told his physician, Freddy Christians, who ordered a 7-day monitor.  He does not have results yet. He has continued on the aspirin , atenolol /chlorthalidone , atorvastatin , and lisinopril .  I wanted to do an echo to see if LV function had improved.  We will order that today. BP is 132/79 with HR 85.  He does not feel that we need to make any changes and I agree. Continue current medications.  Do echocardiogram.  Follow-up with me in 3 months.   Active Problem List: Patient Active Problem List  Diagnosis  . Old myocardial infarction  . Coronary artery disease of native artery of native heart with stable angina pectoris (HCC)  . Tobacco abuse  . Status post coronary artery stent placement  . Primary hypertension  . Pure hypercholesterolemia  . COPD (chronic obstructive pulmonary disease) [facility]      Medical History Past Medical History:  Diagnosis Date  . COPD (chronic obstructive pulmonary disease) (HCC)   . Hyperlipidemia   . Hypertension      History reviewed. No pertinent surgical history.   Social History Social History   Socioeconomic History  . Marital status: Married    Spouse name: Not on file  . Number of children: Not on file  . Years of education: Not on file  . Highest education level: Not on file  Occupational History  . Not  on file  Tobacco Use  . Smoking status: Every Day    Types: Cigarettes  . Smokeless tobacco: Never  Substance and Sexual Activity  . Alcohol use: Not Currently  . Drug use: Not Currently  . Sexual activity: Not on file  Other Topics Concern  . Not on file  Social History Narrative  . Not on file   Social Determinants of Health   Food Insecurity: Not on file  Transportation Needs: Not on file  Living Situation: Not on file    Social History   Tobacco Use  Smoking Status Every Day  . Types: Cigarettes  Smokeless Tobacco Never   He states he is raising his 28 and 69 year old grandchildren.  FH History reviewed. No pertinent family history.   I reviewed the Problem List, PMH, PSH, SH, and FH with Vinie SHAUNNA Kristene today.  Medications Current Outpatient Medications  Medication Sig Dispense Refill  . albuterol  90 mcg/actuation inhaler Inhale 2 puffs into the lungs every 4 (four) hours as needed for Wheezing or Shortness of Breath.    . ALPRAZolam  (XANAX ) 0.5 MG tablet Take 1 tablet (0.5 mg total) by mouth.    . aspirin  81 MG EC tablet *ANTIPLATELET* Take 1 tablet (81 mg total) by mouth daily.    . atenoloL -chlorthalidone  (TENORETIC ) 100-25 mg per tablet Take 1 tablet by mouth daily. 30 tablet 11  . atorvastatin  (LIPITOR) 80 MG tablet Take 1 tablet (80 mg total) by mouth daily at 6pm. 30 tablet 11  . budesonide -formoteroL  (SYMBICORT ) 160-4.5 mcg/actuation inhaler Inhale 2 puffs into the lungs daily at 8pm.    . doxycycline  hyclate 100 MG tablet      . fluticasone  propionate (FLONASE ) 50 mcg/actuation nasal spray USE 2 SPRAYS (100 MCG) IN EACH NOSTRIL BY  INTRANASAL ROUTE ONCE DAILY FOR 30 DAYS    . fluticasone -umeclidin-vilanter (TRELEGY ELLIPTA ) 100-62.5-25 mcg inhaler Inhale 1 puff into the lungs daily.    . gabapentin  (NEURONTIN ) 300 MG capsule      . nitroglycerin  (NITROSTAT ) 0.4 MG SL tablet Place 1 tablet (0.4 mg total) under the tongue.    . sertraline  (ZOLOFT ) 50 MG  tablet Take 1 tablet (50 mg total) by mouth daily.     No current facility-administered medications for this visit.    Allergies Allergies  Allergen Reactions  . Codeine Nausea & Vomiting (ALLERGY/intolerance)    Review of Systems Constitutional: no fever, chills, malaise, fatigue.  No weight gain or loss.  + Night sweats. HEENT:  No headaches, visual changes, hearing problems.  No sore throat. Respiratory: No cough, shortness of breath, respiratory distress.  +++COPD.  Recent respiratory infection treated with doxycycline  and prednisone .  No OSA. Cardiovascular: See above. Gastrointestinal: No n/v/d, constipation.  No GERD.  No IBS.  Gallbladder problem. Genitourinary: No urinary problems, kidney stones, incontinence. Skin:  No rashes, bruising. Hematologic/lymphatic: No bleeding, anemia, adenopathy. Musculoskeletal: No muscle pain, difficulty walking, arthritis.  Right shoulder injury in a fall -somewhat better, still causing pain. Neurological: No numbness, tingling, motor weakness, sensory deficits.  Behavioral/Psych: No depression, anxiety. Endocrine: No thyroid disease.  No DM. HLD. Allergic/Immunologic: No seasonal allergies or immunologic deficits.       Objective:    Vital Signs BP 126/86   Pulse 82   Ht 1.676 m (5' 6)   Wt 70.6 kg (155 lb 11.2 oz)   SpO2 92%   BMI 25.13 kg/m  Body mass index is 25.13 kg/m.  Wt Readings from Last 3 Encounters:  12/01/22 70.6 kg (155 lb 11.2 oz)  11/25/21 75.3 kg (166 lb)  03/12/21 75.8 kg (167 lb)   I am not sure why this template is no longer pulling in the vital signs.  The above vitals are from her previous visit. Vitals 03/30/2023: BP 132/79, HR 85.  Pulse ox 95% on room air.  Height 5 feet 6 inches, weight 173#.   Physical Exam Constitutional: WDWN in NAD.  Smells of cigarette smoke. HEENT: PERRL, EOMI.   Neck: Supple without adenopathy.  No JVD.  No thyromegaly.  Carotids exhibit normal upstrokes, no  bruits. Respiratory: Respirations unlabored.  Bilateral inspiratory and expiratory rhonchi.  Cardiovascular:  RRR with normal S1 and S2, no S3 or S4, no m/r/g. Abdominal: Soft, NT/ND.  Extremities: FROM.  Radial pulses 2/2 bilaterally.  No LE edema. Neurologic: Motor strength 5/5 throughout.  No sensory deficits.  Memory, speech, balance appear normal.  Musculoskeletal:  No significant abnormalities.  Walks with a normal gait. Skin:  No rashes.  No bruising or bleeding.    Lab Review   No visits with results within 6 Month(s) from this visit.  Latest known visit with results is:  Admission on 04/07/2019, Discharged on 04/09/2019  Component Date Value  . Troponin I 04/07/2019 2.620 (HH)   . Sodium 04/07/2019 138   . Potassium 04/07/2019 3.8   . Chloride 04/07/2019 105   . CO2 04/07/2019 26   . BUN 04/07/2019 15   . Glucose 04/07/2019 148 (H)   . Creatinine 04/07/2019 1.29   . Calcium  04/07/2019 9.3   . Anion Gap 04/07/2019 7   . Est. GFR Non-African Ame* 04/07/2019 59 (L)   . Prothrombin Time 04/07/2019 13.4   . INR 04/07/2019 1.00   . BNP 04/07/2019 230.0 (H)   .  WBC 04/07/2019 14.5 (H)   . RBC 04/07/2019 5.38   . Hemoglobin 04/07/2019 17.2 (H)   . Hematocrit 04/07/2019 49.6   . MCV 04/07/2019 92.3   . MCH 04/07/2019 32.0   . MCHC 04/07/2019 34.6   . RDW 04/07/2019 14.0   . Platelets 04/07/2019 177   . MPV 04/07/2019 9.3   . Neutrophil % 04/07/2019 54   . Lymphocyte % 04/07/2019 33   . Monocyte % 04/07/2019 10   . Eosinophil % 04/07/2019 2   . Basophil % 04/07/2019 1   . Neutrophil Absolute 04/07/2019 7.8 (H)   . Lymphocyte Absolute 04/07/2019 4.8 (H)   . Monocyte Absolute 04/07/2019 1.5 (H)   . Eosinophil Absolute 04/07/2019 0.3   . Basophil Absolute 04/07/2019 0.1   . Activated Clotting Time 04/07/2019 279 (H)   . Troponin I 04/08/2019 14.119 (HH)   . Activated Clotting Time 04/07/2019 >400 (H)   . Troponin I 04/08/2019 18.729 (HH)   . Sodium 04/08/2019 138   .  Potassium 04/08/2019 4.0   . Chloride 04/08/2019 103   . CO2 04/08/2019 28   . BUN 04/08/2019 15   . Glucose 04/08/2019 133 (H)   . Creatinine 04/08/2019 1.20   . Calcium  04/08/2019 9.2   . Anion Gap 04/08/2019 7   . Est. GFR Non-African Ame* 04/08/2019 64   . WBC 04/08/2019 12.2 (H)   . RBC 04/08/2019 5.49   . Hemoglobin 04/08/2019 17.8 (H)   . Hematocrit 04/08/2019 50.7   . MCV 04/08/2019 92.4   . MCH 04/08/2019 32.4   . MCHC 04/08/2019 35.1   . RDW 04/08/2019 14.4   . Platelets 04/08/2019 142 (L)   . MPV 04/08/2019 9.5   . Total Cholesterol 04/08/2019 172   . Triglycerides 04/08/2019 88   . HDL Cholesterol 04/08/2019 31 (L)   . LDL Cholesterol Calculat* 93/91/7979 123   . Total Chol / HDL Cholest* 04/08/2019 5.5 (H)   . Non-HDL Cholesterol 04/08/2019 141   . HEMOGLOBIN A1C 04/07/2019 5.7 (H)   . eAG 04/07/2019 117   . eAG Comment 04/07/2019    . WBC 04/09/2019 11.2 (H)   . RBC 04/09/2019 5.41   . Hemoglobin 04/09/2019 17.4 (H)   . Hematocrit 04/09/2019 50.0   . MCV 04/09/2019 92.4   . MCH 04/09/2019 32.2   . MCHC 04/09/2019 34.8   . RDW 04/09/2019 14.4   . Platelets 04/09/2019 120 (L)   . MPV 04/09/2019 9.4   . Sodium 04/09/2019 139   . Potassium 04/09/2019 3.9   . Chloride 04/09/2019 106   . CO2 04/09/2019 27   . BUN 04/09/2019 18   . Glucose 04/09/2019 121 (H)   . Creatinine 04/09/2019 1.09   . Calcium  04/09/2019 8.9   . Anion Gap 04/09/2019 6   . Est. GFR Non-African Ame* 04/09/2019 72   . Magnesium 04/09/2019 2.1     ECGs: 11/25/2020:  ND  04/08/2019:  NSR 76, STTWA, consider inferior ischemia.  When compared with the ECG of 04/08/2019, ST no longer elevated in inferior leads, T wave inversion now evident in lateral leads.   Echo 04/08/2019: Mild inferoposterior HK, EF 45-50%.  RV/RA/LA normal.  AV not well visualized.  No AAS/AI.  Mild MR.  IVC not well-visualized.  Ao root normal.  No effusion.  Stress Test    Monitor    Cath/PCI by Dr. Toribio  04/07/2019: 1. Severe stenosis of the mid circumflex at the OM1 bifurcation. 2. Otherwise  nonobstructive CAD. 3. Elevated LVEDP. 4. An LV gram was not done. Will get an echocardiogram.  Interventional Summary 1. Successful stenting of the circumflex into the OM with a 3.5/18 mm Xience drug eluting stent. 2. Successful kissing balloon angioplasty to the distal circumflex with a 2.25 mm balloon. After deployment of the circumflex-OM stent, there was plaque shift into the distal circumflex. This was managed with a 2-step kissing balloon procedure with a good result.  Interventional Recommendations 1. Recovery in the CCU. 2. Aggressive CVD risk factor modification. 3. Dual antiplatelet therapy with aspirin  and prasugrel has been started. This should continue for 12 months with no elective interruptions for the first 6 months.   The ASCVD Risk score (Arnett DK, et al., 2019) failed to calculate for the following reasons:   The patient has a prior MI or stroke diagnosis  Assessment and Plan:   1. Coronary artery disease of native artery of native heart with stable angina pectoris (HCC)      2. Old myocardial infarction      3. Status post coronary artery stent placement      4. Primary hypertension      5. Pure hypercholesterolemia      6. Tobacco abuse        I am not sure why this template is not pulling in today's problem list. CAD Old MI S/p coronary artery stent placement Low ejection fraction 45-50% Ischemic cardiomyopathy Primary HTN   ASSESSMENT/PLAN: CAD, s/p inferior MI 04/08/2019 with stenting of culprit lesion in CFX -- stent from CFX into OM and kissing balloon angioplasty of CFX/OM and on-going OM.  EF 45-50% at that time.  Doing well.  Rare chest discomfort.  Has not had to take SL NTG recently.  BP under good control.  Continue aspirin , atorvastatin , atenolol , lisinopril . Echo 04/08/2019 showed EF 45-50%.  On atenolol  100 and lisinopril  5.  Recheck echo.  May need to  increase lisinopril  as blood pressure tolerates.   HTN.  BP 132/79 and was 126/86 at last visit.   Continue atenolol /chlorthalidone  and lisinopril .  He will monitor his BP at home. HLD.  On atorvastatin  80.  FLP 04/08/2019: TC 172, TG 88, HDL 31, LDL 123.  Check FLP. -I forgot to order this today.  Will do it at the next visit. COPD with diffuse rhonchi noted today.  Continue meds and f/u with [facility]. Doing well.  Echocardiogram to reevaluate LV function.  Continue current meds.  Follow-up 3 months.     Cc: Self, A Referral, Sun, Vyvyan Ye-Lin, MD   Pre-visit charting:  Visit + post-visit charting, orders, Rx, wrap-up, and AVS:   I spent >50% of the time evaluating and counseling the patient and discussing the diagnosis, work-up, general prognosis, and plan of care.

## 2023-04-04 ENCOUNTER — Other Ambulatory Visit: Payer: Self-pay | Admitting: Internal Medicine

## 2023-04-04 DIAGNOSIS — J441 Chronic obstructive pulmonary disease with (acute) exacerbation: Secondary | ICD-10-CM

## 2023-04-05 ENCOUNTER — Telehealth: Payer: Self-pay | Admitting: Internal Medicine

## 2023-04-05 NOTE — Telephone Encounter (Signed)
Patient states needs refill for Daliresp. Pharmacy is [facility] [address] Patient phone number is [phone].

## 2023-04-06 ENCOUNTER — Other Ambulatory Visit: Payer: Self-pay

## 2023-04-06 DIAGNOSIS — J441 Chronic obstructive pulmonary disease with (acute) exacerbation: Secondary | ICD-10-CM

## 2023-04-06 MED ORDER — ROFLUMILAST 500 MCG PO TABS: 500.0000 ug | ORAL_TABLET | Freq: Every day | ORAL | 3 refills | Status: DC

## 2023-04-06 NOTE — Telephone Encounter (Signed)
Daliresp has been sent to pharmacy. Patient is aware. NFN

## 2023-04-06 NOTE — Telephone Encounter (Signed)
Spoke with patient. Advised Daliresp has been sent to pharmacy. NFN

## 2023-04-07 NOTE — Telephone Encounter (Signed)
roflumilast (DALIRESP) 500 MCG TABS tablet sent to pharmacy 04/06/23.

## 2023-04-07 NOTE — Telephone Encounter (Signed)
Medication sent to pharmacy alrealy.

## 2023-04-12 ENCOUNTER — Other Ambulatory Visit: Payer: Self-pay | Admitting: Internal Medicine

## 2023-04-12 ENCOUNTER — Other Ambulatory Visit: Payer: Self-pay | Admitting: Primary Care

## 2023-04-13 ENCOUNTER — Ambulatory Visit
Admission: RE | Admit: 2023-04-13 | Discharge: 2023-04-13 | Disposition: A | Discharge status: Home / Self Care | Payer: Medicare HMO | Source: Ambulatory Visit | Attending: Acute Care | Admitting: Acute Care

## 2023-04-13 DIAGNOSIS — F17210 Nicotine dependence, cigarettes, uncomplicated: Secondary | ICD-10-CM

## 2023-04-13 DIAGNOSIS — Z87891 Personal history of nicotine dependence: Secondary | ICD-10-CM

## 2023-04-13 DIAGNOSIS — Z122 Encounter for screening for malignant neoplasm of respiratory organs: Secondary | ICD-10-CM

## 2023-04-17 ENCOUNTER — Other Ambulatory Visit: Payer: Self-pay

## 2023-04-17 DIAGNOSIS — F17210 Nicotine dependence, cigarettes, uncomplicated: Secondary | ICD-10-CM

## 2023-04-17 DIAGNOSIS — Z87891 Personal history of nicotine dependence: Secondary | ICD-10-CM

## 2023-04-17 DIAGNOSIS — Z122 Encounter for screening for malignant neoplasm of respiratory organs: Secondary | ICD-10-CM

## 2023-04-21 ENCOUNTER — Other Ambulatory Visit: Payer: Self-pay | Admitting: Primary Care

## 2023-05-10 ENCOUNTER — Telehealth: Payer: Self-pay | Admitting: Primary Care

## 2023-05-11 ENCOUNTER — Other Ambulatory Visit: Payer: Self-pay | Admitting: Student

## 2023-05-11 DIAGNOSIS — K7460 Unspecified cirrhosis of liver: Secondary | ICD-10-CM

## 2023-05-12 NOTE — Telephone Encounter (Signed)
Pt called in bc he hasn't got the medication refill

## 2023-05-15 ENCOUNTER — Other Ambulatory Visit: Payer: Self-pay

## 2023-05-15 ENCOUNTER — Other Ambulatory Visit: Payer: Self-pay | Admitting: Primary Care

## 2023-05-15 MED ORDER — TRELEGY ELLIPTA 200-62.5-25 MCG/ACT IN AEPB: 1.0000 | INHALATION_SPRAY | Freq: Every day | RESPIRATORY_TRACT | 5 refills | Status: DC

## 2023-05-15 NOTE — Telephone Encounter (Signed)
Patient is requesting refill on Trelegy 200mg . I verified patients pharmacy. Prescription has been sent. NFN

## 2023-05-15 NOTE — Telephone Encounter (Signed)
Patient has requested refill on Trelegy. Verified patients pharmacy. Script has been sent. NFN

## 2023-05-16 ENCOUNTER — Other Ambulatory Visit: Payer: Medicare HMO

## 2023-05-16 NOTE — Telephone Encounter (Signed)
Patient checking on message for Trelegy refill. Pharmacy is [facility] [address] Patient phone number is [phone].

## 2023-05-29 ENCOUNTER — Other Ambulatory Visit: Payer: Medicare HMO

## 2023-06-19 ENCOUNTER — Ambulatory Visit: Payer: Medicare HMO | Admitting: Primary Care

## 2023-07-08 ENCOUNTER — Other Ambulatory Visit: Payer: Self-pay | Admitting: Internal Medicine

## 2023-07-24 ENCOUNTER — Ambulatory Visit: Payer: Medicare HMO | Admitting: Primary Care

## 2023-08-09 ENCOUNTER — Encounter: Payer: Self-pay | Admitting: Primary Care

## 2023-08-09 ENCOUNTER — Ambulatory Visit: Payer: Medicare HMO | Admitting: Primary Care

## 2023-08-09 VITALS — BP 140/90 | HR 91 | Ht 63.0 in | Wt 182.0 lb

## 2023-08-09 DIAGNOSIS — Z720 Tobacco use: Secondary | ICD-10-CM

## 2023-08-09 DIAGNOSIS — J9611 Chronic respiratory failure with hypoxia: Secondary | ICD-10-CM | POA: Diagnosis not present

## 2023-08-09 MED ORDER — TRELEGY ELLIPTA 200-62.5-25 MCG/ACT IN AEPB: 1.0000 | INHALATION_SPRAY | Freq: Every day | RESPIRATORY_TRACT | 11 refills | Status: DC

## 2023-08-09 MED ORDER — PREDNISONE 10 MG PO TABS: ORAL_TABLET | ORAL | 0 refills | Status: DC

## 2023-08-09 MED ORDER — ALBUTEROL SULFATE HFA 108 (90 BASE) MCG/ACT IN AERS: 2.0000 | INHALATION_SPRAY | Freq: Four times a day (QID) | RESPIRATORY_TRACT | 3 refills | Status: DC | PRN

## 2023-08-09 NOTE — Assessment & Plan Note (Addendum)
-   Stable; No recent exacerbations requiring oral abx/steroids or hospitalization. Dyspnea symptoms are baseline. He has a chronic cough which is worse at night. Prefers Trelegy over [employer]. Using flutter valve and mucinex as needed. He has some wheezing on exam today R>L. RX prednisone taper 40mg  x 3 days, 30mg  x 3 days, 20mg  x 3 days, 10mg  x 3 days. Continue Daliresp daily. Smoking cessation strongly encouraged. FU in 6 months.

## 2023-08-09 NOTE — Assessment & Plan Note (Signed)
-   Stable; No increased demand. No daytime requirements.   - Continue 2.5L oxygen at bedtime

## 2023-08-09 NOTE — Assessment & Plan Note (Addendum)
-   Current smoker; He has quit once in the past, tried chantix and NRT.  - Smoking cessation reviewed, advised he pick quit date and notify friends and family - Following with lung cancer screening program - LDCT June 2024>> Lung RADS2, benign appearance or behavior. Continue annual screening

## 2023-08-09 NOTE — Progress Notes (Signed)
@Patient  ID: Glen Lopez, male    DOB: [birth year], 67 y.o.   MRN: [id number]  Chief Complaint  Patient presents with   Follow-up    Referring provider: Hillery Aldo, NP  HPI: 67 year old male, current everyday smoker. PMH severe COPD, chronic resp failure with hypoxia, HTN, tobacco use, post nasal drip.   Patient of Dr. Marchelle Gearing. Maintained on Trelegy 200 1 puff daily, prn ventolin and hypertonic saline nebulizer as needed.   Continue to encourage complete smoking cessation. Following with lung cancer screening clinic.   08/09/2023- Interim hx  Patient presents today for follow-up Doing alright, appears at baseline Trelegy works best for him, prefers this over [employer]  Using Albuterol 2-3 times a day  He has a chronic cough which is worse at night Weather does affect his breathing He is able to cough up mucus and clear his airways He uses flutter valve as needed  He will take mucinex as needed  Able to do most ADLS, he cuts his grass but will need to stop to rest Walks his dogs morning and evenings about half a mile He wears 2.5L oxygen at night. Takes xanax at bedtime for insomnia  He has custody for his two grandchildren ages 82 and 24 He is still smoking 3-4 cigarettes. He quit smoking for 1 month back in 2022. Being round people who smoke will trigger him. He has tried chantix, patches before Denies fevers, weight loss or hemopytsis   COPD  Respiratory symptoms- Chest congestion. Dyspnea baseline.  Last exacerbation-  > 6 months ago  Maintenance medications - Trelegy , daliresp SABA - 2-3 times a day  Oxygen - He wears 2.5L oxygen at night; no daytime requirements Prednisone - No recent use, sending in RX today for wheezing heard on exam  Smoking status- Current smoker, smoking 3-4 cigarettes a day    TEST/EVENTS:  09/24/2018 PFT: FVC 57, FEV1 35, ratio 53, TLC 124, DLCOunc 52. Positive BD 11/18/2021 LDCT chest: moderate centrilobular  emphysema. Similar lingular and RML scarring with volume loss. Subpleural lymph nodes along left major fissure. Nodule in RLL that was previously of concern has resolved. Other nodules, max 5.3, unchanged. Lung RADS 2. Cholelithiasis. Left nephrolithiasis. Cirrhosis. Atherosclerosis.  12/08/2022 CXR: lungs emphysematous but clear    Allergies  Allergen Reactions   Codeine Nausea And Vomiting    Immunization History  Administered Date(s) Administered   Influenza Split 09/23/2017   Influenza, High Dose Seasonal PF 07/19/2018, 09/30/2021   Influenza, Quadrivalent, Recombinant, Inj, Pf 07/29/2019   Influenza,inj,Quad PF,6+ Mos 09/09/2015, 07/14/2016   Influenza-Unspecified 07/31/2014, 07/31/2020   PFIZER(Purple Top)SARS-COV-2 Vaccination 01/15/2020, 02/16/2020, 08/14/2020   Pneumococcal Conjugate-13 07/19/2018   Pneumococcal Polysaccharide-23 10/31/2013, 07/19/2018    Past Medical History:  Diagnosis Date   Anxiety    Arthritis    Asthma    Bipolar disorder (HCC)    COPD (chronic obstructive pulmonary disease) (HCC)    Depression    GERD (gastroesophageal reflux disease)    Headache(784.0)    Hiatal hernia    Hypertension     Tobacco History: Social History   Tobacco Use  Smoking Status Every Day   Current packs/day: 3.00   Average packs/day: 3.0 packs/day for 50.0 years (150.0 ttl pk-yrs)   Types: Cigarettes   Passive exposure: Past  Smokeless Tobacco Never  Tobacco Comments   6 cigs per day as of 12/08/22 ep   Ready to quit: Not Answered Counseling given: Not Answered Tobacco comments: 6  cigs per day as of 12/08/22 ep   Outpatient Medications Prior to Visit  Medication Sig Dispense Refill   albuterol (PROVENTIL) (2.5 MG/3ML) 0.083% nebulizer solution Take 3 mLs (2.5 mg total) by nebulization every 6 (six) hours as needed. For shortness of breath 75 mL 5   ALPRAZolam (XANAX) 0.5 MG tablet Take 1 tablet (0.5 mg total) by mouth 3 (three) times daily as needed for panic  attacks 90 tablet 3   amLODipine (NORVASC) 5 MG tablet Take 1 tablet by mouth daily.     aspirin EC 81 MG tablet Take 1 tablet (81 mg total) by mouth daily.     atenolol-chlorthalidone (TENORETIC) 100-25 MG tablet Take 1 tablet by mouth daily. 30 tablet 11   atorvastatin (LIPITOR) 80 MG tablet Take 1 tablet (80 mg total) by mouth daily at 6pm. 30 tablet 11   azelastine (ASTELIN) 0.1 % nasal spray Place 1 spray into both nostrils 2 (two) times daily. Use in each nostril as directed 30 mL 1   carvedilol (COREG) 3.125 MG tablet Take 1 tablet (3.125 mg total) by mouth 2 (two) times daily with food 180 tablet 3   famotidine (PEPCID) 20 MG tablet TAKE 1 TABLET(20 MG) BY MOUTH AT BEDTIME 30 tablet 5   fluticasone (FLONASE) 50 MCG/ACT nasal spray SHAKE LIQUID AND USE 2 SPRAYS(100 MCG) IN EACH NOSTRIL EVERY DAY 16 g 3   lisinopril (ZESTRIL) 5 MG tablet Take 1 tablet (5 mg total) by mouth daily. 90 tablet 3   nitroGLYCERIN (NITROSTAT) 0.4 MG SL tablet Place 1 tablet (0.4 mg) under your tongue for chest pain, every 5 min x3 doses. Call EMS on your second dose. 25 tablet 11   Respiratory Therapy Supplies (FLUTTER) DEVI Use 3 times a day. 1 each 0   roflumilast (DALIRESP) 500 MCG TABS tablet TAKE 1 TABLET(500 MCG) BY MOUTH DAILY 30 tablet 2   roflumilast (DALIRESP) 500 MCG TABS tablet TAKE 1 TABLET(500 MCG) BY MOUTH DAILY 30 tablet 3   rosuvastatin (CRESTOR) 20 MG tablet Take 1 tablet (20 mg total) by mouth at bedtime. 90 tablet 3   sertraline (ZOLOFT) 50 MG tablet Take 1 tablet (50 mg total) by mouth daily. 90 tablet 3   albuterol (VENTOLIN HFA) 108 (90 Base) MCG/ACT inhaler INHALE 2 PUFFS INTO THE LUNGS EVERY 4 HOURS AS NEEDED FOR SHORTNESS OF BREATH 18 g 3   Fluticasone-Umeclidin-Vilant (TRELEGY ELLIPTA) 200-62.5-25 MCG/ACT AEPB Inhale 1 puff into the lungs daily. 60 each 5   predniSONE (DELTASONE) 10 MG tablet Take 1 tablet for 3 days then 1/2 tablet for 5 days then stop 5 tablet 0   TRELEGY ELLIPTA  200-62.5-25 MCG/ACT AEPB INHALE 1 PUFF BY MOUTH ONCE A DAY 60 each 5   gabapentin (NEURONTIN) 300 MG capsule TAKE 1 CAPSULE (300 MG) BY MOUTH 3 TIMES PER DAY FOR 90 DAYS 270 capsule 3   loratadine (CLARITIN) 10 MG tablet TAKE 1 TABLET (10 MG) BY MOUTH ONCE DAILY FOR 30 DAYS 30 tablet 3   levalbuterol (XOPENEX) nebulizer solution 0.63 mg      No facility-administered medications prior to visit.   Review of Systems  Review of Systems  Constitutional: Negative.   Respiratory:  Positive for cough and wheezing.   Cardiovascular: Negative.    Physical Exam  BP (!) 140/90 (BP Location: Left Arm, Cuff Size: Normal)   Pulse 91   Ht 5\' 3"  (1.6 m)   Wt 182 lb (82.6 kg)   SpO2 94%  BMI 32.24 kg/m  Physical Exam Constitutional:      General: He is not in acute distress.    Appearance: Normal appearance. He is not ill-appearing.  HENT:     Head: Normocephalic and atraumatic.  Cardiovascular:     Rate and Rhythm: Normal rate and regular rhythm.  Pulmonary:     Breath sounds: Wheezing present.     Comments: L>R Musculoskeletal:        General: Normal range of motion.     Cervical back: Normal range of motion and neck supple.  Skin:    General: Skin is warm and dry.  Neurological:     General: No focal deficit present.     Mental Status: He is alert and oriented to person, place, and time. Mental status is at baseline.  Psychiatric:        Mood and Affect: Mood normal.        Behavior: Behavior normal.        Thought Content: Thought content normal.        Judgment: Judgment normal.      Lab Results:  CBC    Component Value Date/Time   WBC 16.3 (H) 12/09/2015 0557   RBC 4.38 12/09/2015 0557   HGB 14.0 12/09/2015 0557   HCT 43.6 12/09/2015 0557   PLT 111 (L) 12/09/2015 0557   MCV 99.5 12/09/2015 0557   MCH 32.0 12/09/2015 0557   MCHC 32.1 12/09/2015 0557   RDW 15.2 12/09/2015 0557   LYMPHSABS 0.7 12/08/2015 0551   MONOABS 0.3 12/08/2015 0551   EOSABS 0.0 12/08/2015  0551   BASOSABS 0.0 12/08/2015 0551    BMET    Component Value Date/Time   NA 139 12/09/2015 0557   K 4.7 12/09/2015 0557   CL 109 12/09/2015 0557   CO2 24 12/09/2015 0557   GLUCOSE 176 (H) 12/09/2015 0557   BUN 17 12/09/2015 0557   CREATININE 0.94 12/09/2015 0557   CALCIUM 8.4 (L) 12/09/2015 0557   GFRNONAA >60 12/09/2015 0557   GFRAA >60 12/09/2015 0557    BNP No results found for: "BNP"  ProBNP    Component Value Date/Time   PROBNP 125.2 (H) 10/31/2013 1645    Imaging: No results found.   Assessment & Plan:   COPD (chronic obstructive pulmonary disease) with emphysema (HCC) - Stable; No recent exacerbations requiring oral abx/steroids or hospitalization. Dyspnea symptoms are baseline. He has a chronic cough which is worse at night. Prefers Trelegy over [employer]. Using flutter valve and mucinex as needed. He has some wheezing on exam today R>L. RX prednisone taper 40mg  x 3 days, 30mg  x 3 days, 20mg  x 3 days, 10mg  x 3 days. Continue Daliresp daily. Smoking cessation strongly encouraged. FU in 6 months.   Chronic respiratory failure with hypoxia (HCC) - Stable; No increased demand. No daytime requirements.   - Continue 2.5L oxygen at bedtime  Tobacco use - Current smoker; He has quit once in the past, tried chantix and NRT.  - Smoking cessation reviewed, advised he pick quit date and notify friends and family - Following with lung cancer screening program - LDCT June 2024>> Lung RADS2, benign appearance or behavior. Continue annual screening    [name], NP 08/09/2023

## 2023-08-09 NOTE — Patient Instructions (Addendum)
Recommendations: Continue Trelegy one puff daily Continue Albuterol 2 puffs every 4-6 hours for breakthrough shortness of breath/wheezing Continue Daliresp tablet daily  Continue to wear 2.5L oxygen at bedtime  Prednisone taper as directed d/t wheezing heard on exam  Use flutter valve 2-3 times to loosen congestion Take Mucinex 600 mg 1 to 2 tablets daily as needed for congestion/cough  For lung cancer screening in June 2025 Encourage smoking cessation, please consider picking a quit date and letting friends and family know  Follow-up 6 months with Dr. Marchelle Gearing or sooner if needed

## 2023-08-17 MED ORDER — AZITHROMYCIN 250 MG PO TABS: 250.0000 mg | ORAL_TABLET | Freq: Every day | ORAL | 0 refills | Status: DC

## 2023-08-17 NOTE — Telephone Encounter (Addendum)
Pt seen by [facility] 08/09/23  He is messaging today stating for the past 3 days he has been coughing up yellow sputum  He is asking for abx to be called in  He says Beth told him to let her know if he developed discolored sputum  He denies any fever He has had some wheezing  Please advise, thanks!  Allergies  Allergen Reactions   Codeine Nausea And Vomiting

## 2023-08-28 ENCOUNTER — Other Ambulatory Visit: Payer: Self-pay | Admitting: Internal Medicine

## 2023-09-20 ENCOUNTER — Other Ambulatory Visit (HOSPITAL_COMMUNITY): Payer: Self-pay

## 2023-09-21 ENCOUNTER — Other Ambulatory Visit (HOSPITAL_COMMUNITY): Payer: Self-pay

## 2023-09-21 ENCOUNTER — Telehealth: Payer: Self-pay | Admitting: Internal Medicine

## 2023-09-21 NOTE — Telephone Encounter (Signed)
Test claim shows refill is too soon due to most recent fill on 11/11. PA is showing as not needed when run under Brand Daliresp.

## 2023-09-21 NOTE — Telephone Encounter (Signed)
An appeal for medication roflumilast . Pre Berkley Harvey was denied. Clinical documents need to be sent over [phone] ref# [id number]

## 2023-09-21 NOTE — Telephone Encounter (Signed)
I called and spoke with [facility] and was told he picked up the prescription on 11/13.  The pharmacy staff put a note in his record to run it as name brand when he requests a refill as this does not require a PA.  Nothing further needed.

## 2023-09-25 ENCOUNTER — Telehealth: Payer: Self-pay | Admitting: Internal Medicine

## 2023-09-25 NOTE — Telephone Encounter (Signed)
Faxed over additonal questions for pt medication roflumilast (DALIRESP) 500 MCG TABS tablet   Pls fax back to [phone]

## 2023-10-02 ENCOUNTER — Other Ambulatory Visit (HOSPITAL_COMMUNITY): Payer: Self-pay

## 2023-10-02 NOTE — Telephone Encounter (Signed)
PA not needed at this time- test claim shows refill too soon until 12/04

## 2023-10-16 ENCOUNTER — Other Ambulatory Visit: Payer: Self-pay | Admitting: Gastroenterology

## 2023-10-16 DIAGNOSIS — K769 Liver disease, unspecified: Secondary | ICD-10-CM

## 2023-10-16 DIAGNOSIS — K7460 Unspecified cirrhosis of liver: Secondary | ICD-10-CM

## 2023-11-17 ENCOUNTER — Other Ambulatory Visit: Payer: Self-pay | Admitting: Primary Care

## 2023-11-29 ENCOUNTER — Ambulatory Visit
Admission: RE | Admit: 2023-11-29 | Discharge: 2023-11-29 | Disposition: A | Discharge status: Home / Self Care | Payer: 59 | Source: Ambulatory Visit | Attending: Gastroenterology | Admitting: Gastroenterology

## 2023-11-29 DIAGNOSIS — K7460 Unspecified cirrhosis of liver: Secondary | ICD-10-CM

## 2023-11-29 DIAGNOSIS — K769 Liver disease, unspecified: Secondary | ICD-10-CM

## 2023-11-29 MED ORDER — GADOPICLENOL 0.5 MMOL/ML IV SOLN
8.0000 mL | Freq: Once | INTRAVENOUS | Status: AC | PRN
  Administered 2023-11-29: 8 mL via INTRAVENOUS

## 2023-12-03 ENCOUNTER — Other Ambulatory Visit: Payer: Self-pay | Admitting: Internal Medicine

## 2023-12-06 ENCOUNTER — Encounter: Payer: Self-pay | Admitting: Internal Medicine

## 2023-12-06 MED ORDER — ROFLUMILAST 500 MCG PO TABS: 500.0000 ug | ORAL_TABLET | Freq: Every day | ORAL | 3 refills | Status: DC

## 2023-12-21 ENCOUNTER — Encounter: Payer: Self-pay | Admitting: *Deleted

## 2023-12-21 ENCOUNTER — Other Ambulatory Visit: Payer: Self-pay | Admitting: *Deleted

## 2023-12-21 NOTE — Progress Notes (Signed)
 Referral received for patient to evaluate liver lesions for possible HCC.  Lesions LI-RAD 3 with no dramatic change since last scan. AFP normal.  Patient discussed at GI Tumor Board 12/20/23 and imaging does not meet criteria for diagnosis of HCC.  Dr. Mosetta Putt notified referring provider of discussion and recommendations from tumor board.

## 2023-12-21 NOTE — Progress Notes (Signed)
 The proposed treatment discussed in conference is for discussion purpose only and is not a binding recommendation.  The patients have not been physically examined, or presented with their treatment options.  Therefore, final treatment plans cannot be decided.

## 2023-12-26 NOTE — Progress Notes (Signed)
 [facility] Heart Failure Outpatient Follow-up   Patient Name: Glen Lopez Patient Date of Birth: Sep 10, 1956 Patient MR#: [id number]  PCP: Vyvyan Ye-Lin Sun Primary Cardiologist: Darice Sharps, MD, historically also seen by Dr. Toribio Date: 12/26/2023   Assessment and Plan           1.  Coronary artery disease with acute inferior wall STEMI, June 2020.  In the Culprit lesion was the mid circumflex; treated with DES from the mid circumflex into an obtuse marginal.  The ongoing circumflex was treated with kissing balloon angioplasty with a good result.     -TTE  05/11/23 noted LVEF 45 to 50%.  Grade 1A diastolic dysfunction with normal LA pressure.  Basal and mid LV inferolateral wall hypokinesis.  PFO suspected.  Mild MR.  No significant change in prior study  On GDMT with aspirin , atenolol , atorvastatin , and lisinopril .  Patient asymptomatic.  No changes to therapy today.  He was noted to be tachycardic however he states he used his albuterol  inhaler prior to appointment.  He was advised to obtain a blood pressure cuff or pulse ox to monitor his heart rate and call the office if consistently greater than 100.   2.  Essential hypertension.   Well-controlled.  This is managed by his PCP.  Goal BP less than 130/80.  Was advised to obtain a BP cuff as above    3.  Tobacco abuse.   Previously smoked about 3 packs; decreased to ~5 cigarettes a day.  He was congratulated on his progress but complete smoking cessation was strongly advised   4.  COPD.   This is managed by his PCP.  Significant issue causing chronic DOE which is essentially stable.  Uses inhalers   5.  Hyperlipidemia.     On atorvastatin  80 mg daily.  Goal LDL less than 70 with ideal goal less than 55.  Followed by PCP who checks labs.  Unfortunately I am unable to review in EMR   Plan: -The pt presents today as a follow-up? Most recently seen by Darice Sharps, MD 03/30/2023  -EKG shows sinus  tachycardia with heart rate of 114.  He was advised to obtain a monitoring system at home to monitor his heart rate and call the office if persistently elevated greater than 100.  He uses albuterol  inhaler prior to his appointment today which is likely the source of his tachycardia  -Continue GDMT.  No clinical indication for additional cardiac testing at this time  -Follow-up with Darice Sharps, MD or care team in ~ 1 year   Reena Costain, DNP, FNP-C     Linked Problem list   Problem List Items Addressed This Visit   None    Orders/Follow Up/Time  No orders of the defined types were placed in this encounter.   Follow up: No follow-ups on file.  Coordination of care time:  I have personally spent 36 minutes engaged in face-to-face and non-face-to-face activities for this patient on the day of the visit.  Professional time spent includes the following activities, in addition to those noted in the documentation: greater than 50% of time was spent reviewing and interpreting labs and test results, discussing treamtment options, reviewing and adjusting medications, EMR documentation, counseling the patient, providing education, and, through shared decision making with the pt, developing plan of care.     History of Present Illness and ROS  Reason for Consult/Visit:  No chief complaint on file.  Glen Lopez is a 68 y.o. male with a past medical history significant for above presenting in the office for follow-up.  The patient tells me he has done well from a cardiac perspective with no specific cardiac complaints.  He does have chronic longstanding DOE with history of COPD and smoking however he feels this is stable.  He does use nebulizer treatments at night and albuterol  inhalers which he used prior to appointment likely inducing tachycardia as noted on EKG. He will occasionally become lightheaded with positional changes and have sensations of the room spinning but denies  syncope  Otherwise from a cardiac perspective he denies chest pain/angina, palpitations, lower extremity edema, and PND.  He does sleep elevated for comfort and breathing however this does not suggest orthopnea  OSA/CPAP: never tested-denies PND Smoking history: See Full details below in social history. Has smoked since the age of ~ 48 and smokes 5-6 a day down from 3 ppd Activity/Exercise: Pt remains active in day to day life. Trys his best to push himself-walking the dog for 15-20 min and yard work. DOE walking the dog-longstanding, stable  Has scale and weighs self daily: Home weights typically ~? Has BP cuff and checks BPs at home: Home blood pressures typically  ~? Taking medication regularly: Yes  Limiting salt (1500-2000 mg/day): some  Wt Readings from Last 10 Encounters:  03/30/23 78.5 kg (173 lb)  12/01/22 70.6 kg (155 lb 11.2 oz)  11/25/21 75.3 kg (166 lb)  03/12/21 75.8 kg (167 lb)      Review of Systems: An complete review of systems was negative except for the problems as described above in HPI  Allergies  Allergen Reactions  . Codeine GI Intolerance    Medications:   Current Outpatient Medications:  .  albuterol  HFA (PROVENTIL  HFA;VENTOLIN  HFA;PROAIR  HFA) 90 mcg/actuation inhaler, Inhale 2 puffs every 4 (four) hours as needed for wheezing or shortness of breath. Indications: asthma attack, Disp: , Rfl:  .  ALPRAZolam  (XANAX ) 0.5 mg tablet, Take 0.5 mg by mouth., Disp: , Rfl:  .  aspirin  81 mg EC tablet, Take 81 mg by mouth Once Daily., Disp: , Rfl:  .  atenoloL -chlorthalidone  100-25 mg tab, TAKE 1 TABLET BY MOUTH DAILY, Disp: 30 tablet, Rfl: 11 .  atorvastatin  (LIPITOR) 80 mg tablet, TAKE 1 TABLET BY MOUTH DAILY AT 6 PM, Disp: 30 tablet, Rfl: 11 .  budesonide -formoteroL  (SYMBICORT ) 160-4.5 mcg/actuation inhaler, Inhale 2 puffs Once Daily. Indications: controller medication for asthma, Disp: , Rfl:  .  fluticasone  propionate (FLONASE ) 50 mcg/spray nasal spray, USE 2  SPRAYS (100 MCG) IN EACH NOSTRIL BY INTRANASAL ROUTE ONCE DAILY FOR 30 DAYS, Disp: , Rfl:  .  fluticasone -umeclidinium-vilanterol (Trelegy Ellipta ) 100-62.5-25 mcg inhaler, Inhale 1 puff Once Daily., Disp: , Rfl:  .  gabapentin  (NEURONTIN ) 300 mg capsule, Take 300 mg by mouth daily., Disp: , Rfl:  .  lisinopriL  (PRINIVIL ) 5 mg tablet, Take 5 mg by mouth Once Daily., Disp: 90 tablet, Rfl: 3 .  nitroglycerin  (NITROSTAT ) 0.4 mg SL tablet, Place 0.4 mg under the tongue., Disp: , Rfl:  .  sertraline  (ZOLOFT ) 50 mg tablet, Take 50 mg by mouth Once Daily., Disp: , Rfl:   Past Medical History:  Diagnosis Date  . COPD (chronic obstructive pulmonary disease) (CMS/HCC)   . Hyperlipidemia   . Hypertension     No past surgical history on file.  Social History: Social History   Tobacco Use  Smoking Status Every Day  Smokeless Tobacco Never  Social History   Substance and Sexual Activity  Alcohol Use Not Currently   Social History   Substance and Sexual Activity  Drug Use Not Currently    No family history on file.   Physical Examination  VITAL SIGNS: There were no vitals filed for this visit.   Consitutional:  Well-groomed, well-nourished, and in no acute distress Neck:  Supple, no carotid bruits, or JVD.   Respiratory: Scattered noted in lower lobes, clear upper lobes. No use of accessory muscles noted.  Smells strongly of cigarettes Cardiovascular:  Regular rate & rhythm, no rubs, thrills, gallops or murmurs Gastrointestinal:  Abd soft, nontender, non-distended Musculoskeletal No appreciable pitting edema of bilateral lower extremities.   Skin: Warm and dry to touch Pulses:  2+ and symmetrical radial and pedals Neurological:  Alert and oriented x 3, no focal deficits Psychiatric: Affect appropriate. Cooperative  This note was created utilizing [employer] dictation software and may contain transcription errors that were missed during proofreading.   Objective Data Reviewed  During this Patient Encounter  Test Results:    @ENCIMG30DAYS @  Labs:  No results found for: CHOL, TRIG, HDL, LDL No results found for: WBC, HGB, HCT, PLT No results found for: NA, K, CL, CO2, BUN, CREATININE, GLU, AST, ALT, PT   I personally reviewed recent, pertinent labs and test results, and discussed significant findings with the patient  CVD RISK STRATIFICATION RESULTS: No results found for: CHOL, LDL, HDL, TSH, A1C  The ASCVD Risk score (Arnett DK, et al., 2019) failed to calculate for the following reasons:   Risk score cannot be calculated because patient has a medical history suggesting prior/existing ASCVD  Portions of this note were dictated using [employer] voice recognition software. Please disregard any errors in transcription. Errors have been sought and corrected, but may not always be located. Such creation errors do not reflect on the standard of medical care.  Reena Merton Costain, DNP, APRN 12/26/2023

## 2024-01-11 ENCOUNTER — Other Ambulatory Visit: Payer: Self-pay | Admitting: Internal Medicine

## 2024-01-12 ENCOUNTER — Telehealth: Payer: Self-pay

## 2024-01-12 NOTE — Telephone Encounter (Signed)
Please call and schedule pt for a f/u

## 2024-02-05 ENCOUNTER — Encounter: Payer: Self-pay | Admitting: Internal Medicine

## 2024-02-05 ENCOUNTER — Telehealth: Payer: Self-pay | Admitting: Internal Medicine

## 2024-02-05 ENCOUNTER — Ambulatory Visit (INDEPENDENT_AMBULATORY_CARE_PROVIDER_SITE_OTHER): Payer: Medicare HMO | Admitting: Internal Medicine

## 2024-02-05 VITALS — BP 128/62 | HR 117 | Temp 98.4°F | Ht 63.0 in | Wt 183.0 lb

## 2024-02-05 DIAGNOSIS — J449 Chronic obstructive pulmonary disease, unspecified: Secondary | ICD-10-CM

## 2024-02-05 DIAGNOSIS — J441 Chronic obstructive pulmonary disease with (acute) exacerbation: Secondary | ICD-10-CM | POA: Diagnosis not present

## 2024-02-05 DIAGNOSIS — F17210 Nicotine dependence, cigarettes, uncomplicated: Secondary | ICD-10-CM

## 2024-02-05 DIAGNOSIS — Z122 Encounter for screening for malignant neoplasm of respiratory organs: Secondary | ICD-10-CM

## 2024-02-05 DIAGNOSIS — R053 Chronic cough: Secondary | ICD-10-CM | POA: Diagnosis not present

## 2024-02-05 LAB — CBC WITH DIFFERENTIAL/PLATELET
Basophils Absolute: 0 10*3/uL (ref 0.0–0.1)
Basophils Relative: 0.3 % (ref 0.0–3.0)
Eosinophils Absolute: 0.1 10*3/uL (ref 0.0–0.7)
Eosinophils Relative: 1.8 % (ref 0.0–5.0)
HCT: 51.9 % (ref 39.0–52.0)
Hemoglobin: 17.6 g/dL — ABNORMAL HIGH (ref 13.0–17.0)
Lymphocytes Relative: 25.3 % (ref 12.0–46.0)
Lymphs Abs: 2.1 10*3/uL (ref 0.7–4.0)
MCHC: 34 g/dL (ref 30.0–36.0)
MCV: 93.5 fl (ref 78.0–100.0)
Monocytes Absolute: 0.6 10*3/uL (ref 0.1–1.0)
Monocytes Relative: 7.4 % (ref 3.0–12.0)
Neutro Abs: 5.3 10*3/uL (ref 1.4–7.7)
Neutrophils Relative %: 65.2 % (ref 43.0–77.0)
Platelets: 163 10*3/uL (ref 150.0–400.0)
RBC: 5.55 Mil/uL (ref 4.22–5.81)
RDW: 15.4 % (ref 11.5–15.5)
WBC: 8.1 10*3/uL (ref 4.0–10.5)

## 2024-02-05 MED ORDER — ALBUTEROL SULFATE (2.5 MG/3ML) 0.083% IN NEBU: 2.5000 mg | INHALATION_SOLUTION | Freq: Four times a day (QID) | RESPIRATORY_TRACT | 5 refills | Status: AC | PRN

## 2024-02-05 MED ORDER — PREDNISONE 10 MG PO TABS: ORAL_TABLET | ORAL | 0 refills | Status: AC

## 2024-02-05 MED ORDER — DOXYCYCLINE HYCLATE 100 MG PO TABS: 100.0000 mg | ORAL_TABLET | Freq: Two times a day (BID) | ORAL | 0 refills | Status: DC

## 2024-02-05 NOTE — Progress Notes (Signed)
 08/09/2023- Interim hx   68 year old male, current everyday smoker. PMH severe COPD, chronic resp failure with hypoxia, HTN, tobacco use, post nasal drip.   Patient of Dr. Marchelle Gearing. Maintained on Trelegy 200 1 puff daily, prn ventolin and hypertonic saline nebulizer as needed.   Continue to encourage complete smoking cessation. Following with lung cancer screening clinic.    Patient presents today for follow-up Doing alright, appears at baseline Trelegy works best for him, prefers this over [employer]  Using Albuterol 2-3 times a day  He has a chronic cough which is worse at night Weather does affect his breathing He is able to cough up mucus and clear his airways He uses flutter valve as needed  He will take mucinex as needed  Able to do most ADLS, he cuts his grass but will need to stop to rest Walks his dogs morning and evenings about half a mile He wears 2.5L oxygen at night. Takes xanax at bedtime for insomnia  He has custody for his two grandchildren ages 78 and 76 He is still smoking 3-4 cigarettes. He quit smoking for 1 month back in 2022. Being round people who smoke will trigger him. He has tried chantix, patches before Denies fevers, weight loss or hemopytsis   COPD  Respiratory symptoms- Chest congestion. Dyspnea baseline.  Last exacerbation-  > 6 months ago  Maintenance medications - Trelegy , daliresp SABA - 2-3 times a day  Oxygen - He wears 2.5L oxygen at night; no daytime requirements Prednisone - No recent use, sending in RX today for wheezing heard on exam  Smoking status- Current smoker, smoking 3-4 cigarettes a day    TEST/EVENTS:  09/24/2018 PFT: FVC 57, FEV1 35, ratio 53, TLC 124, DLCOunc 52. Positive BD 11/18/2021 LDCT chest: moderate centrilobular emphysema. Similar lingular and RML scarring with volume loss. Subpleural lymph nodes along left major fissure. Nodule in RLL that was previously of concern has resolved. Other nodules,  max 5.3, unchanged. Lung RADS 2. Cholelithiasis. Left nephrolithiasis. Cirrhosis. Atherosclerosis.  12/08/2022 CXR: lungs emphysematous but clear     OV 02/05/2024  Subjective:  Patient ID: Glen Lopez, male , DOB: 14-Apr-1956 , age 88 y.o. , MRN: [id number] , ADDRESS: [address] [name] [address] PCP Hillery Aldo, NP Patient Care Team: Hillery Aldo, NP as PCP - General  This Provider for this visit: Treatment Team:  Attending Provider: Kalman Shan, MD    02/05/2024 -   Chief Complaint  Patient presents with   Follow-up    Increased cough over the past 3-4 wks. He had been producing yellow sputum- now clear.      HPI Glen Lopez 68 y.o. -returns for regular follow-up.  Last PFT 2019.  He continues to smoke.  Have not seen him in a while.  He tells me for the last 3 weeks he is got increased cough, congestion, and yellow sputum but no fever no sick contacts.  Coincides with onset of allergy season.  He says he called the office and was told to wait for today's follow-up.  He could not and then he took 10 days of his wife's penicillin which ended 6 days ago and he is reports it has not helped him at all.  He is got significant cough review of the medications show that he is on lisinopril for the last 1 year he says.  He is also on Daliresp and Trelegy and Flonase which she says he is  compliant with.  We discussed Biologics Dupixent if he qualifies and Othuvayre and he is interested in these.  He is due for lung cancer screening CT in June 2025.  Otherwise no other new issues.    CT Chest data from date: LDCT June 2024  IMPRESSION: 1. Lung-RADS 2, benign appearance or behavior. Continue annual screening with low-dose chest CT without contrast in 12 months. 2. Hepatic steatosis and cirrhosis. 3. Cholelithiasis. 4. Left nephrolithiasis. 5. Aortic Atherosclerosis (ICD10-I70.0) and Emphysema (ICD10-J43.9). Coronary artery atherosclerosis.     Electronically  Signed   By: Jeronimo Greaves M.D.   On: 04/14/2023 16:12  PFT     Latest Ref Rng & Units 09/24/2018    8:58 AM  PFT Results  FVC-Pre L 2.18   FVC-Predicted Pre % 57   FVC-Post L 2.59   FVC-Predicted Post % 67   Pre FEV1/FVC % % 47   Post FEV1/FCV % % 53   FEV1-Pre L 1.03   FEV1-Predicted Pre % 35   FEV1-Post L 1.36   DLCO uncorrected ml/min/mmHg 13.12   DLCO UNC% % 52   DLVA Predicted % 61   TLC L 7.32   TLC % Predicted % 124   RV % Predicted % 225        LAB RESULTS last 96 hours No results found.       has a past medical history of Anxiety, Arthritis, Asthma, Bipolar disorder (HCC), COPD (chronic obstructive pulmonary disease) (HCC), Depression, GERD (gastroesophageal reflux disease), Headache(784.0), Hiatal hernia, and Hypertension.   reports that he has been smoking cigarettes. He has a 150 pack-year smoking history. He has been exposed to tobacco smoke. He has never used smokeless tobacco.  Past Surgical History:  Procedure Laterality Date   artheroscopic knee surgery R Right 01/2013    Allergies  Allergen Reactions   Codeine Nausea And Vomiting    Immunization History  Administered Date(s) Administered   Influenza Split 09/23/2017   Influenza, High Dose Seasonal PF 07/19/2018, 09/30/2021   Influenza, Quadrivalent, Recombinant, Inj, Pf 07/29/2019   Influenza,inj,Quad PF,6+ Mos 09/09/2015, 07/14/2016   Influenza-Unspecified 07/31/2014, 07/31/2020   PFIZER(Purple Top)SARS-COV-2 Vaccination 01/15/2020, 02/16/2020, 08/14/2020   Pneumococcal Conjugate-13 07/19/2018   Pneumococcal Polysaccharide-23 10/31/2013, 07/19/2018    Family History  Problem Relation Age of Onset   Heart disease Father        MI at age 43   Asthma Sister      Current Outpatient Medications:    albuterol (PROVENTIL) (2.5 MG/3ML) 0.083% nebulizer solution, Take 3 mLs (2.5 mg total) by nebulization every 6 (six) hours as needed. For shortness of breath, Disp: 75 mL, Rfl: 5    albuterol (VENTOLIN HFA) 108 (90 Base) MCG/ACT inhaler, INHALE 2 PUFFS INTO THE LUNGS EVERY 6 HOURS AS NEEDED FOR WHEEZING OR SHORTNESS OF BREATH, Disp: 18 g, Rfl: 3   ALPRAZolam (XANAX) 0.5 MG tablet, Take 1 tablet (0.5 mg total) by mouth 3 (three) times daily as needed for panic attacks, Disp: 90 tablet, Rfl: 3   amLODipine (NORVASC) 5 MG tablet, Take 1 tablet by mouth daily., Disp: , Rfl:    aspirin EC 81 MG tablet, Take 1 tablet (81 mg total) by mouth daily., Disp: , Rfl:    atenolol-chlorthalidone (TENORETIC) 100-25 MG tablet, Take 1 tablet by mouth daily., Disp: 30 tablet, Rfl: 11   atorvastatin (LIPITOR) 80 MG tablet, Take 1 tablet (80 mg total) by mouth daily at 6pm., Disp: 30 tablet, Rfl: 11   azelastine (ASTELIN)  0.1 % nasal spray, Place 1 spray into both nostrils 2 (two) times daily. Use in each nostril as directed, Disp: 30 mL, Rfl: 1   carvedilol (COREG) 3.125 MG tablet, Take 1 tablet (3.125 mg total) by mouth 2 (two) times daily with food, Disp: 180 tablet, Rfl: 3   famotidine (PEPCID) 20 MG tablet, TAKE 1 TABLET(20 MG) BY MOUTH AT BEDTIME, Disp: 30 tablet, Rfl: 5   fluticasone (FLONASE) 50 MCG/ACT nasal spray, SHAKE LIQUID AND USE 2 SPRAYS(100 MCG) IN EACH NOSTRIL EVERY DAY, Disp: 16 g, Rfl: 3   Fluticasone-Umeclidin-Vilant (TRELEGY ELLIPTA) 200-62.5-25 MCG/ACT AEPB, Inhale 1 puff into the lungs daily., Disp: 60 each, Rfl: 11   lisinopril (ZESTRIL) 5 MG tablet, Take 1 tablet (5 mg total) by mouth daily., Disp: 90 tablet, Rfl: 3   nitroGLYCERIN (NITROSTAT) 0.4 MG SL tablet, Place 1 tablet (0.4 mg) under your tongue for chest pain, every 5 min x3 doses. Call EMS on your second dose., Disp: 25 tablet, Rfl: 11   Respiratory Therapy Supplies (FLUTTER) DEVI, Use 3 times a day., Disp: 1 each, Rfl: 0   roflumilast (DALIRESP) 500 MCG TABS tablet, Take 1 tablet (500 mcg total) by mouth daily., Disp: 30 tablet, Rfl: 3   rosuvastatin (CRESTOR) 20 MG tablet, Take 1 tablet (20 mg total) by mouth at  bedtime., Disp: 90 tablet, Rfl: 3   sertraline (ZOLOFT) 50 MG tablet, Take 1 tablet (50 mg total) by mouth daily., Disp: 90 tablet, Rfl: 3   gabapentin (NEURONTIN) 300 MG capsule, TAKE 1 CAPSULE (300 MG) BY MOUTH 3 TIMES PER DAY FOR 90 DAYS, Disp: 270 capsule, Rfl: 3   loratadine (CLARITIN) 10 MG tablet, TAKE 1 TABLET (10 MG) BY MOUTH ONCE DAILY FOR 30 DAYS, Disp: 30 tablet, Rfl: 3      Objective:   Vitals:   02/05/24 1056 02/05/24 1057  BP:  128/62  Pulse: (!) 117   Temp:  98.4 F (36.9 C)  TempSrc:  Oral  SpO2: 93%   Weight:  183 lb (83 kg)  Height:  5\' 3"  (1.6 m)    Estimated body mass index is 32.42 kg/m as calculated from the following:   Height as of this encounter: 5\' 3"  (1.6 m).   Weight as of this encounter: 183 lb (83 kg).  @WEIGHTCHANGE @  [employer]   02/05/24 1057  Weight: 183 lb (83 kg)     Physical Exam   General: No distress. Looks well. SOunds congested O2 at rest: no Cane present: no Sitting in wheel chair: no Frail: no Obese: no Neuro: Alert and Oriented x 3. GCS 15. Speech normal Psych: Pleasant Resp:  Barrel Chest - MILD YES.  Wheeze - COARSE, Crackles - no, No overt respiratory distress CVS: Normal heart sounds. Murmurs - no Ext: Stigmata of Connective Tissue Disease - no HEENT: Normal upper airway. PEERL +. No post nasal drip        Assessment:       ICD-10-CM   1. COPD, severe (HCC)  J44.9 Ambulatory Referral for DME    CBC w/Diff    IgE    Pulmonary function test    2. COPD with acute exacerbation (HCC)  J44.1 CBC w/Diff    IgE    Pulmonary function test    3. COPD, frequent exacerbations (HCC)  J44.1 CBC w/Diff    IgE    Pulmonary function test    4. Moderate cigarette smoker (10-19 per day)  F17.210 CBC w/Diff  IgE    Pulmonary function test    5. Chronic cough  R05.3 CBC w/Diff    IgE    Pulmonary function test    6. Screening for lung cancer  Z12.2 CBC w/Diff    IgE    Pulmonary function test          Plan:     Patient Instructions     ICD-10-CM   1. COPD, severe (HCC)  J44.9 Ambulatory Referral for DME    2. COPD with acute exacerbation (HCC)  J44.1     3. COPD, frequent exacerbations (HCC)  J44.1     4. Moderate cigarette smoker (10-19 per day)  F17.210     5. Chronic cough  R05.3     6. Screening for lung cancer  Z12.2       COPD flareup and baseline COPD  -You are in yet another flareup  Plan  - Check CBC with differential and blood IgE   -If these are abnormal we can consider targeted biological therapy  - Take prednisone 40 mg daily x 2 days, then 20mg  daily x 2 days, then 10mg  daily x 2 days, then 5mg  daily x 2 days and stop -Take doxycycline 100mg  po twice daily x 5 days; take after meals and avoid sunlight    Continue Trelegy one puff daily Continue Albuterol 2 puffs every 4-6 hours for breakthrough shortness of breath/wheezing Continue Daliresp tablet daily  Continue to wear 2.5L oxygen at bedtime  Use flutter valve 2-3 times to loosen congestion Take Mucinex 600 mg 1 to 2 tablets daily as needed for congestion/cough   Do repeat spirometry in 3 months  #Lung cancer screening For lung cancer screening in June 2025  #Smoking Encourage smoking cessation, please consider picking a quit date and letting friends and family know  #Chronic cough  Plan  - Talk to primary care physician and stop lisinopril  Follow-up 3 months with nurse practitioner but after breathing test  - consider Biologic or Othuvyaryre at followup   FOLLOWUP Return in about 3 months (around 05/06/2024) for with any of the APPS, Face to Face Visit.    SIGNATURE    Dr. Kalman Shan, M.D., F.C.C.P,  Pulmonary and Critical Care Medicine Staff Physician, [facility] Director - Interstitial Lung Disease  Program  Pulmonary Fibrosis [facility] [place], [name], [address]  Pager: [phone], If no answer or  between  15:00h - 7:00h: call [phone] Telephone: [phone]  11:24 AM 02/05/2024

## 2024-02-05 NOTE — Patient Instructions (Addendum)
 ICD-10-CM   1. COPD, severe (HCC)  J44.9 Ambulatory Referral for DME    2. COPD with acute exacerbation (HCC)  J44.1     3. COPD, frequent exacerbations (HCC)  J44.1     4. Moderate cigarette smoker (10-19 per day)  F17.210     5. Chronic cough  R05.3     6. Screening for lung cancer  Z12.2       COPD flareup and baseline COPD  -You are in yet another flareup  Plan  - Check CBC with differential and blood IgE   -If these are abnormal we can consider targeted biological therapy  - Take prednisone 40 mg daily x 2 days, then 20mg  daily x 2 days, then 10mg  daily x 2 days, then 5mg  daily x 2 days and stop -Take doxycycline 100mg  po twice daily x 5 days; take after meals and avoid sunlight    Continue Trelegy one puff daily Continue Albuterol 2 puffs every 4-6 hours for breakthrough shortness of breath/wheezing Continue Daliresp tablet daily  Continue to wear 2.5L oxygen at bedtime  Use flutter valve 2-3 times to loosen congestion Take Mucinex 600 mg 1 to 2 tablets daily as needed for congestion/cough   Do repeat spirometry in 3 months  #Lung cancer screening For lung cancer screening in June 2025  #Smoking Encourage smoking cessation, please consider picking a quit date and letting friends and family know  #Chronic cough  Plan  - Talk to primary care physician and stop lisinopril  Follow-up 3 months with nurse practitioner but after breathing test  - consider Biologic or Othuvyaryre at followup

## 2024-02-05 NOTE — Telephone Encounter (Signed)
 Patient was seen today in office and needs prednisone and antibiotic to be called in to [facility] on [address].

## 2024-02-05 NOTE — Telephone Encounter (Signed)
 Spoke with pt regarding meds & neb solution sent to pharmacy. Pt is aware  NFN.  Take prednisone 40 mg daily x 2 days, then 20mg  daily x 2 days, then 10mg  daily x 2 days, then 5mg  daily x 2 days and stop -Take doxycycline 100mg  po twice daily x 5 days; take after meals and avoid sunlight

## 2024-02-06 ENCOUNTER — Encounter: Payer: Self-pay | Admitting: Internal Medicine

## 2024-02-06 LAB — IGE: IgE (Immunoglobulin E), Serum: 31 kU/L (ref <=114)

## 2024-02-08 NOTE — Telephone Encounter (Signed)
 I did the blood test to see if he would benefit from a biologic injection therapy called Dupixent because of his frequent COPD flareup.  However his blood eosinophils are normal right now and so at this point in time he will not qualify for Dupixent.  Certainly we can retest this at some point in the future.  Overall the blood test is normal except his hemoglobin is high which is probably because of his COPD and smoking.  We can continue to monitor that

## 2024-03-27 ENCOUNTER — Other Ambulatory Visit: Payer: Self-pay | Admitting: Internal Medicine

## 2024-03-27 NOTE — Telephone Encounter (Signed)
 Pt is requesting med refill for roflumilast . Med is not mentioned on last LOV note. Please advise. Thank you.

## 2024-04-05 NOTE — Telephone Encounter (Signed)
 Glen Lopez w/ [facility] following up on refill request roflumilast  (DALIRESP ) 500 MCG TABS tablet . This request is long overdue. Please advise, and thank you!

## 2024-04-15 ENCOUNTER — Ambulatory Visit: Payer: Self-pay | Admitting: Student

## 2024-04-15 ENCOUNTER — Other Ambulatory Visit: Payer: Self-pay | Admitting: Internal Medicine

## 2024-04-15 ENCOUNTER — Other Ambulatory Visit: Payer: Self-pay

## 2024-04-15 ENCOUNTER — Ambulatory Visit
Admission: RE | Admit: 2024-04-15 | Discharge: 2024-04-15 | Disposition: A | Discharge status: Home / Self Care | Source: Ambulatory Visit | Attending: Acute Care | Admitting: Acute Care

## 2024-04-15 DIAGNOSIS — F17210 Nicotine dependence, cigarettes, uncomplicated: Secondary | ICD-10-CM

## 2024-04-15 DIAGNOSIS — J441 Chronic obstructive pulmonary disease with (acute) exacerbation: Secondary | ICD-10-CM

## 2024-04-15 DIAGNOSIS — J449 Chronic obstructive pulmonary disease, unspecified: Secondary | ICD-10-CM

## 2024-04-15 DIAGNOSIS — Z122 Encounter for screening for malignant neoplasm of respiratory organs: Secondary | ICD-10-CM

## 2024-04-15 DIAGNOSIS — Z87891 Personal history of nicotine dependence: Secondary | ICD-10-CM

## 2024-04-15 MED ORDER — ROFLUMILAST 500 MCG PO TABS: 500.0000 ug | ORAL_TABLET | Freq: Every day | ORAL | 3 refills | Status: DC

## 2024-04-15 NOTE — Telephone Encounter (Signed)
 Copied from CRM [id number]. Topic: Clinical - Medication Refill >> Apr 15, 2024  2:56 PM Dyann Glaser G wrote: Medication: roflumilast  (DALIRESP ) 500 MCG  Has the patient contacted their pharmacy? Yes (Agent: If no, request that the patient contact the pharmacy for the refill. If patient does not wish to contact the pharmacy document the reason why and proceed with request.) (Agent: If yes, when and what did the pharmacy advise?)  This is the patient's preferred pharmacy:  [facility] STORE #[id number] [place], [place] - [address] AT [facility] [address] [name] [address] Phone: [phone] Fax: [phone]  Is this the correct pharmacy for this prescription? Yes If no, delete pharmacy and type the correct one.   Has the prescription been filled recently? Yes  Is the patient out of the medication? Yes  Has the patient been seen for an appointment in the last year OR does the patient have an upcoming appointment? Yes  Can we respond through MyChart? No  Agent: Please be advised that Rx refills may take up to 3 business days. We ask that you follow-up with your pharmacy.

## 2024-04-15 NOTE — Telephone Encounter (Unsigned)
 Copied from CRM [id number]. Topic: Clinical - Medication Refill >> Apr 15, 2024  2:56 PM Dyann Glaser G wrote: Medication: roflumilast  (DALIRESP ) 500 MCG  Has the patient contacted their pharmacy? Yes (Agent: If no, request that the patient contact the pharmacy for the refill. If patient does not wish to contact the pharmacy document the reason why and proceed with request.) (Agent: If yes, when and what did the pharmacy advise?)  This is the patient's preferred pharmacy:  [facility] STORE #[id number] [place], [place] - [address] AT [facility] [address] [name] [address] Phone: [phone] Fax: [phone]  Is this the correct pharmacy for this prescription? Yes If no, delete pharmacy and type the correct one.   Has the prescription been filled recently? Yes  Is the patient out of the medication? Yes  Has the patient been seen for an appointment in the last year OR does the patient have an upcoming appointment? Yes  Can we respond through MyChart? No  Agent: Please be advised that Rx refills may take up to 3 business days. We ask that you follow-up with your pharmacy.

## 2024-04-15 NOTE — Telephone Encounter (Signed)
 Spoke with patient to let him know I have sent his medication Daliresp . Patient's voice was understanding.Nothing else further needed.

## 2024-04-15 NOTE — Telephone Encounter (Signed)
 Copied from CRM [id number]. Topic: Clinical - Prescription Issue >> Apr 15, 2024  3:00 PM Dyann Glaser G wrote: Reason for CRM: QUIN WITH [facility] STATED THEY HAVE BEEN CALLING AND FAXING THE PRESCPTION FOR roflumilast  (DALIRESP ) 500 MCG FOR THE PATIENT BUT NO RESPONSE. STATED THEY WILL REFAX, ALSO I SENT THE PRSCRIPTON REFILL REQ

## 2024-04-16 NOTE — Telephone Encounter (Signed)
 Spoke with patient regarding prior message.Patient did get his refill for Daliresp  . Patient's voice was understanding .Nothing else further needed.

## 2024-04-18 ENCOUNTER — Other Ambulatory Visit: Payer: Self-pay | Admitting: Internal Medicine

## 2024-04-18 MED ORDER — ALBUTEROL SULFATE HFA 108 (90 BASE) MCG/ACT IN AERS: 2.0000 | INHALATION_SPRAY | Freq: Four times a day (QID) | RESPIRATORY_TRACT | 3 refills | Status: DC | PRN

## 2024-04-18 NOTE — Telephone Encounter (Signed)
 Copied from CRM [id number]. Topic: Clinical - Medication Refill >> Apr 18, 2024 10:35 AM Whitney O wrote: Medication: albuterol  (VENTOLIN  HFA) 108 (90 Base) MCG/ACT inhaler  Has the patient contacted their pharmacy? Yes no refills (Agent: If no, request that the patient contact the pharmacy for the refill. If patient does not wish to contact the pharmacy document the reason why and proceed with request.) (Agent: If yes, when and what did the pharmacy advise?)  This is the patient's preferred pharmacy: [facility] [phone] [place], [place] - [address] AT [facility] [address] [name] [address] Phone: [phone] Fax: [phone]  [facility] #[id number] [place], [place] - [phone] AT [facility] OF [facility] & [address] [name] [address] Phone: [phone] Fax: [phone]  Is this the correct pharmacy for this prescription? Yes If no, delete pharmacy and type the correct one.  [facility] #[id number] -  [place], [place] - [address] AT [facility] [address]  [name] [address] Phone: [phone] Fax: [phone]    Has the prescription been filled recently? No  Is the patient out of the medication? No patient will be out Saturday or Sunday   Has the patient been seen for an appointment in the last year OR does the patient have an upcoming appointment? Yes 7/14  Can we respond through MyChart? Yes  Agent: Please be advised that Rx refills may take up to 3 business days. We ask that you follow-up with your pharmacy.

## 2024-04-22 ENCOUNTER — Ambulatory Visit: Payer: Self-pay | Admitting: Surgery

## 2024-05-01 ENCOUNTER — Other Ambulatory Visit: Payer: Self-pay | Admitting: Surgery

## 2024-05-01 DIAGNOSIS — R160 Hepatomegaly, not elsewhere classified: Secondary | ICD-10-CM

## 2024-05-02 ENCOUNTER — Telehealth: Payer: Self-pay | Admitting: Primary Care

## 2024-05-02 NOTE — Telephone Encounter (Signed)
 Fax received from Dr. Leonor Dawn with [facility] to perform a laparoscopic cholecystectomy under general anesthesia on patient.  Patient needs surgery clearance. Surgery is pending. Patient was seen on 02/05/24. Office protocol is a risk assessment can be sent to surgeon if patient has been seen in 60 days or less.   Pt is scheduled for PFT and ov with Beth on 05/13/24- appt note updated to reflect the need for risk assessment. Will route to clearance pool for now.

## 2024-05-12 NOTE — Progress Notes (Unsigned)
 @Patient  ID: Glen Lopez, male    DOB: June 08, 1956, 68 y.o.   MRN: [id number]  No chief complaint on file.   Referring provider: Geronimo Amel, MD  HPI:  68 year old male, current everyday smoker. PMH severe COPD, chronic resp failure with hypoxia, HTN, tobacco use, post nasal drip.   Patient of Dr. Geronimo. Maintained on Trelegy 200 1 puff daily, prn ventolin  and hypertonic saline nebulizer as needed.   Continue to encourage complete smoking cessation. Following with lung cancer screening clinic.   08/09/2023 Patient presents today for follow-up Doing alright, appears at baseline Trelegy works best for him, prefers this over [employer]  Using Albuterol  2-3 times a day  He has a chronic cough which is worse at night Weather does affect his breathing He is able to cough up mucus and clear his airways He uses flutter valve as needed  He will take mucinex as needed  Able to do most ADLS, he cuts his grass but will need to stop to rest Walks his dogs morning and evenings about half a mile He wears 2.5L oxygen  at night. Takes xanax  at bedtime for insomnia  He has custody for his two grandchildren ages 29 and 67 He is still smoking 3-4 cigarettes. He quit smoking for 1 month back in 2022. Being round people who smoke will trigger him. He has tried chantix, patches before Denies fevers, weight loss or hemopytsis   COPD  Respiratory symptoms- Chest congestion. Dyspnea baseline.  Last exacerbation-  > 6 months ago  Maintenance medications - Trelegy 200mcg, daliresp  500mcg SABA - 2-3 times a day  Oxygen  - He wears 2.5L oxygen  at night; no daytime requirements Prednisone  - No recent use, sending in RX today for wheezing heard on exam  Smoking status- Current smoker, smoking 3-4 cigarettes a day     02/05/2024 -       Chief Complaint  Patient presents with   Follow-up      Increased cough over the past 3-4 wks. He had been producing yellow sputum- now clear.      Glen Lopez 68 y.o. -returns for regular follow-up.  Last PFT 2019.  He continues to smoke.  Have not seen him in a while.  He tells me for the last 3 weeks he is got increased cough, congestion, and yellow sputum but no fever no sick contacts.  Coincides with onset of allergy season.  He says he called the office and was told to wait for today's follow-up.  He could not and then he took 10 days of his wife's penicillin which ended 6 days ago and he is reports it has not helped him at all.  He is got significant cough review of the medications show that he is on lisinopril  for the last 1 year he says.  He is also on Daliresp  and Trelegy and Flonase  which she says he is compliant with.  We discussed Biologics Dupixent if he qualifies and Othuvayre and he is interested in these.  He is due for lung cancer screening CT in June 2025.  Otherwise no other new issues.      05/13/2024- Interim hx  Patient presents today for 3 month follow-up/ COPD, tobacco abuse  Maintained on Daliresp  and Trelegy and Flonase  which she says he is compliant with.  We discussed Biologics Dupixent if he qualifies and Othuvayre and he is interested in these.  He is due for lung cancer screening CT in June 2025.  TEST/EVENTS:  09/24/2018 PFT: FVC 57, FEV1 35, ratio 53, TLC 124, DLCOunc 52. Positive BD 11/18/2021 LDCT chest: moderate centrilobular emphysema. Similar lingular and RML scarring with volume loss. Subpleural lymph nodes along left major fissure. Nodule in RLL that was previously of concern has resolved. Other nodules, max 5.3, unchanged. Lung RADS 2. Cholelithiasis. Left nephrolithiasis. Cirrhosis. Atherosclerosis.  12/08/2022 CXR: lungs emphysematous but clear         Allergies  Allergen Reactions   Codeine Nausea And Vomiting    Immunization History  Administered Date(s) Administered   Influenza Split 09/23/2017   Influenza, High Dose Seasonal PF 07/19/2018, 09/30/2021   Influenza,  Quadrivalent, Recombinant, Inj, Pf 07/29/2019   Influenza,inj,Quad PF,6+ Mos 09/09/2015, 07/14/2016   Influenza-Unspecified 07/31/2014, 07/31/2020   PFIZER(Purple Top)SARS-COV-2 Vaccination 01/15/2020, 02/16/2020, 08/14/2020   Pneumococcal Conjugate-13 07/19/2018   Pneumococcal Polysaccharide-23 10/31/2013, 07/19/2018    Past Medical History:  Diagnosis Date   Anxiety    Arthritis    Asthma    Bipolar disorder (HCC)    COPD (chronic obstructive pulmonary disease) (HCC)    Depression    GERD (gastroesophageal reflux disease)    Headache(784.0)    Hiatal hernia    Hypertension     Tobacco History: Social History   Tobacco Use  Smoking Status Every Day   Current packs/day: 3.00   Average packs/day: 3.0 packs/day for 50.0 years (150.0 ttl pk-yrs)   Types: Cigarettes   Passive exposure: Past  Smokeless Tobacco Never  Tobacco Comments   6 cigs per day as of 12/08/22 ep   Ready to quit: Not Answered Counseling given: Not Answered Tobacco comments: 6 cigs per day as of 12/08/22 ep   Outpatient Medications Prior to Visit  Medication Sig Dispense Refill   albuterol  (PROVENTIL ) (2.5 MG/3ML) 0.083% nebulizer solution Take 3 mLs (2.5 mg total) by nebulization every 6 (six) hours as needed. For shortness of breath 75 mL 5   albuterol  (VENTOLIN  HFA) 108 (90 Base) MCG/ACT inhaler Inhale 2 puffs into the lungs every 6 (six) hours as needed for wheezing or shortness of breath. 18 g 3   ALPRAZolam  (XANAX ) 0.5 MG tablet Take 1 tablet (0.5 mg total) by mouth 3 (three) times daily as needed for panic attacks 90 tablet 3   amLODipine  (NORVASC ) 5 MG tablet Take 1 tablet by mouth daily.     aspirin  EC 81 MG tablet Take 1 tablet (81 mg total) by mouth daily.     atenolol -chlorthalidone  (TENORETIC ) 100-25 MG tablet Take 1 tablet by mouth daily. 30 tablet 11   atorvastatin  (LIPITOR) 80 MG tablet Take 1 tablet (80 mg total) by mouth daily at 6pm. 30 tablet 11   azelastine  (ASTELIN ) 0.1 % nasal spray  Place 1 spray into both nostrils 2 (two) times daily. Use in each nostril as directed 30 mL 1   carvedilol  (COREG ) 3.125 MG tablet Take 1 tablet (3.125 mg total) by mouth 2 (two) times daily with food 180 tablet 3   doxycycline  (VIBRA -TABS) 100 MG tablet Take 1 tablet (100 mg total) by mouth 2 (two) times daily. 10 tablet 0   famotidine  (PEPCID ) 20 MG tablet TAKE 1 TABLET(20 MG) BY MOUTH AT BEDTIME 30 tablet 5   fluticasone  (FLONASE ) 50 MCG/ACT nasal spray SHAKE LIQUID AND USE 2 SPRAYS(100 MCG) IN EACH NOSTRIL EVERY DAY 16 g 3   Fluticasone -Umeclidin-Vilant (TRELEGY ELLIPTA ) 200-62.5-25 MCG/ACT AEPB Inhale 1 puff into the lungs daily. 60 each 11   gabapentin  (NEURONTIN ) 300 MG capsule TAKE 1  CAPSULE (300 MG) BY MOUTH 3 TIMES PER DAY FOR 90 DAYS 270 capsule 3   lisinopril  (ZESTRIL ) 5 MG tablet Take 1 tablet (5 mg total) by mouth daily. 90 tablet 3   loratadine  (CLARITIN ) 10 MG tablet TAKE 1 TABLET (10 MG) BY MOUTH ONCE DAILY FOR 30 DAYS 30 tablet 3   nitroGLYCERIN  (NITROSTAT ) 0.4 MG SL tablet Place 1 tablet (0.4 mg) under your tongue for chest pain, every 5 min x3 doses. Call EMS on your second dose. 25 tablet 11   Respiratory Therapy Supplies (FLUTTER) DEVI Use 3 times a day. 1 each 0   roflumilast  (DALIRESP ) 500 MCG TABS tablet Take 1 tablet (500 mcg total) by mouth daily. 30 tablet 3   rosuvastatin  (CRESTOR ) 20 MG tablet Take 1 tablet (20 mg total) by mouth at bedtime. 90 tablet 3   sertraline  (ZOLOFT ) 50 MG tablet Take 1 tablet (50 mg total) by mouth daily. 90 tablet 3   No facility-administered medications prior to visit.      Review of Systems  Review of Systems   Physical Exam  There were no vitals taken for this visit. Physical Exam   Lab Results:  CBC    Component Value Date/Time   WBC 8.1 02/05/2024 1137   RBC 5.55 02/05/2024 1137   HGB 17.6 (H) 02/05/2024 1137   HCT 51.9 02/05/2024 1137   PLT 163.0 02/05/2024 1137   MCV 93.5 02/05/2024 1137   MCH 32.0 12/09/2015  0557   MCHC 34.0 02/05/2024 1137   RDW 15.4 02/05/2024 1137   LYMPHSABS 2.1 02/05/2024 1137   MONOABS 0.6 02/05/2024 1137   EOSABS 0.1 02/05/2024 1137   BASOSABS 0.0 02/05/2024 1137    BMET    Component Value Date/Time   NA 139 12/09/2015 0557   K 4.7 12/09/2015 0557   CL 109 12/09/2015 0557   CO2 24 12/09/2015 0557   GLUCOSE 176 (H) 12/09/2015 0557   BUN 17 12/09/2015 0557   CREATININE 0.94 12/09/2015 0557   CALCIUM  8.4 (L) 12/09/2015 0557   GFRNONAA >60 12/09/2015 0557   GFRAA >60 12/09/2015 0557    BNP No results found for: BNP  ProBNP    Component Value Date/Time   PROBNP 125.2 (H) 10/31/2013 1645    Imaging: CT CHEST LUNG CA SCREEN LOW DOSE W/O CM Result Date: 04/28/2024 CLINICAL DATA:  68 year old male current smoker with 56 pack-year smoking history EXAM: CT CHEST WITHOUT CONTRAST LOW-DOSE FOR LUNG CANCER SCREENING TECHNIQUE: Multidetector CT imaging of the chest was performed following the standard protocol without IV contrast. RADIATION DOSE REDUCTION: This exam was performed according to the departmental dose-optimization program which includes automated exposure control, adjustment of the mA and/or kV according to patient size and/or use of iterative reconstruction technique. COMPARISON:  04/13/2023 screening chest CT FINDINGS: Cardiovascular: Normal heart size. No significant pericardial effusion/thickening. Left anterior descending and left circumflex coronary atherosclerosis. Atherosclerotic nonaneurysmal thoracic aorta. Normal caliber pulmonary arteries. Mediastinum/Nodes: No significant thyroid nodules. Unremarkable esophagus. No pathologically enlarged axillary, mediastinal or hilar lymph nodes, noting limited sensitivity for the detection of hilar adenopathy on this noncontrast study. Lungs/Pleura: No pneumothorax. No pleural effusion. Severe centrilobular emphysema with diffuse bronchial wall thickening. No acute consolidative airspace disease or lung masses.  Similar thick bandlike scarring in the right middle lobe and lingula. Stable scattered solid pulmonary nodules up to 8.2 mm in the peripheral right lower lobe on image 221, not substantially changed back to baseline 12/20/2017 CT. No new significant pulmonary nodules. Upper  abdomen: Cholelithiasis. Diffusely irregular liver surface compatible with cirrhosis. Coarse 17 mm upper left renal parenchymal calcification, unchanged. Musculoskeletal: No aggressive appearing focal osseous lesions. Moderate thoracic spondylosis. IMPRESSION: 1. Lung-RADS 2, benign appearance or behavior. Continue annual screening with low-dose chest CT without contrast in 12 months. 2. Two-vessel coronary atherosclerosis. 3. Cholelithiasis. 4. Cirrhosis. 5. Aortic Atherosclerosis (ICD10-I70.0) and Emphysema (ICD10-J43.9). Electronically Signed   By: Selinda DELENA Blue M.D.   On: 04/28/2024 19:32     Assessment & Plan:   No problem-specific Assessment & Plan notes found for this encounter.     Almarie LELON Ferrari, NP 05/12/2024

## 2024-05-13 ENCOUNTER — Ambulatory Visit: Admitting: Internal Medicine

## 2024-05-13 ENCOUNTER — Ambulatory Visit (INDEPENDENT_AMBULATORY_CARE_PROVIDER_SITE_OTHER): Admitting: Primary Care

## 2024-05-13 ENCOUNTER — Ambulatory Visit
Admission: RE | Admit: 2024-05-13 | Discharge: 2024-05-13 | Disposition: A | Discharge status: Home / Self Care | Source: Ambulatory Visit | Attending: Surgery

## 2024-05-13 VITALS — BP 134/70 | HR 96 | Temp 97.6°F | Ht 64.5 in | Wt 187.0 lb

## 2024-05-13 DIAGNOSIS — Z122 Encounter for screening for malignant neoplasm of respiratory organs: Secondary | ICD-10-CM

## 2024-05-13 DIAGNOSIS — K8080 Other cholelithiasis without obstruction: Secondary | ICD-10-CM

## 2024-05-13 DIAGNOSIS — J449 Chronic obstructive pulmonary disease, unspecified: Secondary | ICD-10-CM

## 2024-05-13 DIAGNOSIS — R053 Chronic cough: Secondary | ICD-10-CM | POA: Diagnosis not present

## 2024-05-13 DIAGNOSIS — R160 Hepatomegaly, not elsewhere classified: Secondary | ICD-10-CM

## 2024-05-13 DIAGNOSIS — J9611 Chronic respiratory failure with hypoxia: Secondary | ICD-10-CM | POA: Diagnosis not present

## 2024-05-13 DIAGNOSIS — J441 Chronic obstructive pulmonary disease with (acute) exacerbation: Secondary | ICD-10-CM

## 2024-05-13 DIAGNOSIS — Z01811 Encounter for preprocedural respiratory examination: Secondary | ICD-10-CM

## 2024-05-13 DIAGNOSIS — F17210 Nicotine dependence, cigarettes, uncomplicated: Secondary | ICD-10-CM

## 2024-05-13 LAB — PULMONARY FUNCTION TEST
DL/VA % pred: 62 %
DL/VA: 2.61 ml/min/mmHg/L
DLCO cor % pred: 60 %
DLCO cor: 13.38 ml/min/mmHg
DLCO unc % pred: 60 %
DLCO unc: 13.38 ml/min/mmHg
FEF 25-75 Pre: 0.47 L/s
FEF2575-%Pred-Pre: 22 %
FEV1-%Pred-Pre: 41 %
FEV1-Pre: 1.12 L
FEV1FVC-%Pred-Pre: 66 %
FEV6-%Pred-Pre: 66 %
FEV6-Pre: 2.26 L
FEV6FVC-%Pred-Pre: 105 %
FVC-%Pred-Pre: 62 %
FVC-Pre: 2.28 L
Pre FEV1/FVC ratio: 49 %
Pre FEV6/FVC Ratio: 99 %

## 2024-05-13 MED ORDER — GADOPICLENOL 0.5 MMOL/ML IV SOLN
8.0000 mL | Freq: Once | INTRAVENOUS | Status: AC | PRN
  Administered 2024-05-13: 8 mL via INTRAVENOUS

## 2024-05-13 NOTE — Patient Instructions (Signed)
  VISIT SUMMARY: You came in today for a preoperative evaluation in preparation for your upcoming cholecystectomy. We reviewed your severe COPD management, smoking status, and other health conditions to ensure you are ready for surgery.  YOUR PLAN: -SEVERE COPD: Severe COPD means that your lung function is significantly reduced. Your current lung function is at 41%, which is a slight improvement from 2019. Continue using Trelegy inhaler on the day of surgery, Ohtuvayre  nebulizer twice daily, and start taking Mucinex twice daily for 1-2 weeks before surgery. Use a flutter valve three times daily and an incentive spirometer post-surgery. Early ambulation post-surgery is encouraged to prevent complications. Monitor for respiratory infections and postpone surgery if symptoms develop.  -CURRENT SMOKER: Smoking a few cigarettes daily increases your surgical risk and affects your COPD. We recommend reading 'The Easy Way to Stop Smoking' by Dasie Myron and talking to your family and friends for support in quitting.  -CHOLELITHIASIS: Cholelithiasis means you have gallstones, and you are scheduled for a cholecystectomy to remove your gallbladder. A preoperative pulmonary evaluation was done today for surgical clearance.   -CORONARY ARTERY DISEASE: Coronary artery disease means that the blood vessels supplying your heart are narrowed or blocked. You are currently managing this condition with Lipitor.  INSTRUCTIONS: Please follow the medication and therapy instructions provided for your COPD management. Ensure you read the recommended book for smoking cessation and seek support from your family and friends. Monitor for any signs of respiratory infections and contact us  if symptoms develop.   Follow-up 3 months with Dr. Geronimo

## 2024-05-13 NOTE — Patient Instructions (Signed)
Spirometry/dlco performed today. 

## 2024-05-13 NOTE — Progress Notes (Signed)
Spirometry/dlco performed today. 

## 2024-05-20 ENCOUNTER — Telehealth: Payer: Self-pay

## 2024-05-20 DIAGNOSIS — R911 Solitary pulmonary nodule: Secondary | ICD-10-CM

## 2024-05-20 NOTE — Telephone Encounter (Signed)
 Copy of Beth's risk assessment from 05/13/24 was faxed to [facility].

## 2024-05-20 NOTE — Telephone Encounter (Signed)
 Results reviewed by Ruthell, NP. Per provider please call patient and review results advise a 6 month f/u scan is needed to evaluate nodule growth. Order scan. Send results and plan to PCP.   IMPRESSION: 1. Lung-RADS 2, benign appearance or behavior. Continue annual screening with low-dose chest CT without contrast in 12 months. 2. Two-vessel coronary atherosclerosis. 3. Cholelithiasis. 4. Cirrhosis. 5. Aortic Atherosclerosis (ICD10-I70.0) and Emphysema (ICD10-J43.9).

## 2024-05-21 NOTE — Telephone Encounter (Signed)
 Called and spoke to pt. Informed him of the recommendations from Lauraine Lites NP to do a 6 month follow up scan due to nodule growth, 6.42mm to 8.31mm. Patient verbalized understanding and is agreeable to plan. Order placed. Patient had a liver MRI on 7/14 to assess the cirrhosis. Patient getting cardiac clearance for a future cholecystectomy. Patient taking statin and states he has regular follow ups with pcp regarding cholesterol.

## 2024-05-21 NOTE — Addendum Note (Signed)
 Addended by: RHETT KELLY POUR on: 05/21/2024 10:30 AM   Modules accepted: Orders

## 2024-05-30 ENCOUNTER — Other Ambulatory Visit

## 2024-05-31 NOTE — Progress Notes (Signed)
 Surgery orders requested via Epic inbox.

## 2024-06-03 ENCOUNTER — Ambulatory Visit: Payer: Self-pay | Admitting: Surgery

## 2024-06-06 NOTE — Patient Instructions (Signed)
 SURGICAL WAITING ROOM VISITATION Patients having surgery or a procedure may have no more than 2 support people in the waiting area - these visitors may rotate in the visitor waiting room.   If the patient needs to stay at the hospital during part of their recovery, the visitor guidelines for inpatient rooms apply.  PRE-OP VISITATION  Pre-op nurse will coordinate an appropriate time for 1 support person to accompany the patient in pre-op.  This support person may not rotate.  This visitor will be contacted when the time is appropriate for the visitor to come back in the pre-op area.  Please refer to the [facility] website for the visitor guidelines for Inpatients (after your surgery is over and you are in a regular room).  You are not required to quarantine at this time prior to your surgery. However, you must do this: Hand Hygiene often Do NOT share personal items Notify your provider if you are in close contact with someone who has COVID or you develop fever 100.4 or greater, new onset of sneezing, cough, sore throat, shortness of breath or body aches.  If you test positive for Covid or have been in contact with anyone that has tested positive in the last 10 days please notify you surgeon.    Your procedure is scheduled on:  Tuesday June 11, 2024  Report to [facility] Main Entrance: [address] entrance where the [employer] is available.   Report to admitting at: 09:15  AM  Call this number if you have any questions or problems the morning of surgery [phone]  FOLLOW ANY ADDITIONAL PRE OP INSTRUCTIONS YOU RECEIVED FROM YOUR SURGEON'S OFFICE!!!  Do not eat food after Midnight the night prior to your surgery/procedure.  After Midnight you may have the following liquids until   08:30 AM DAY OF SURGERY  Clear Liquid Diet Water Black Coffee (sugar ok, NO MILK/CREAM OR CREAMERS)  Tea (sugar ok, NO MILK/CREAM OR CREAMERS) regular and decaf                              Plain Jell-O  with no fruit (NO RED)                                           Fruit ices (not with fruit pulp, NO RED)                                     Popsicles (NO RED)                                                                  Juice: NO CITRUS JUICES: only apple, WHITE grape, WHITE cranberry Sports drinks like Gatorade or Powerade (NO RED)                Oral Hygiene is also important to reduce your risk of infection.        Remember - BRUSH YOUR TEETH THE MORNING OF SURGERY WITH YOUR REGULAR TOOTHPASTE  Do NOT smoke after Midnight  the night before surgery.  STOP TAKING all Vitamins, Herbs and supplements 1 week before your surgery.   Take ONLY these medicines the morning of surgery with A SIP OF WATER: Famotidine , amlodipine , atenolol , sertraline , pregabalin, Roflumilast . You may take your alprazolam  (Xanax ) if needed. You may use your inhalers and nebulizer if needed.   DO NOT TAKE Lisinopril  the morning of your surgery.    If You have been diagnosed with Sleep Apnea - Bring CPAP mask and tubing day of surgery. We will provide you with a CPAP machine on the day of your surgery.                   You may not have any metal on your body including  jewelry, and body piercing  Do not wear  lotions, powders, cologne, or deodorant  Men may shave face and neck.  Contacts, Hearing Aids, dentures or bridgework may not be worn into surgery. DENTURES WILL BE REMOVED PRIOR TO SURGERY PLEASE DO NOT APPLY Poly grip OR ADHESIVES!!!  You may bring a small overnight bag with you on the day of surgery, only pack items that are not valuable. [facility] IS NOT RESPONSIBLE   FOR VALUABLES THAT ARE LOST OR STOLEN.   Patients discharged on the day of surgery will not be allowed to drive home.  Someone NEEDS to stay with you for the first 24 hours after anesthesia.  Do not bring your home medications to the hospital. The Pharmacy will dispense medications listed on your medication list  to you during your admission in the Hospital.  Please read over the following fact sheets you were given: IF YOU HAVE QUESTIONS ABOUT YOUR PRE-OP INSTRUCTIONS, PLEASE CALL [phone]   [facility] - Preparing for Surgery Before surgery, you can play an important role.  Because skin is not sterile, your skin needs to be as free of germs as possible.  You can reduce the number of germs on your skin by washing with CHG (chlorahexidine gluconate) soap before surgery.  CHG is an antiseptic cleaner which kills germs and bonds with the skin to continue killing germs even after washing. Please DO NOT use if you have an allergy to CHG or antibacterial soaps.  If your skin becomes reddened/irritated stop using the CHG and inform your nurse when you arrive at Short Stay. Do not shave (including legs and underarms) for at least 48 hours prior to the first CHG shower.  You may shave your face/neck.  Please follow these instructions carefully:  1.  Shower with CHG Soap the night before surgery and the  morning of surgery.  2.  If you choose to wash your hair, wash your hair first as usual with your normal  shampoo.  3.  After you shampoo, rinse your hair and body thoroughly to remove the shampoo.                             4.  Use CHG as you would any other liquid soap.  You can apply chg directly to the skin and wash.  Gently with a scrungie or clean washcloth.  5.  Apply the CHG Soap to your body ONLY FROM THE NECK DOWN.   Do not use on face/ open                           Wound or open sores. Avoid contact  with eyes, ears mouth and genitals (private parts).                       Wash face,  Genitals (private parts) with your normal soap.             6.  Wash thoroughly, paying special attention to the area where your  surgery  will be performed.  7.  Thoroughly rinse your body with warm water from the neck down.  8.  DO NOT shower/wash with your normal soap after using and rinsing off the CHG Soap.             9.  Pat yourself dry with a clean towel.            10.  Wear clean pajamas.            11.  Place clean sheets on your bed the night of your first shower and do not  sleep with pets.  ON THE DAY OF SURGERY : Do not apply any lotions/deodorants the morning of surgery.  Please wear clean clothes to the hospital/surgery center.     FAILURE TO FOLLOW THESE INSTRUCTIONS MAY RESULT IN THE CANCELLATION OF YOUR SURGERY  PATIENT SIGNATURE_________________________________  NURSE SIGNATURE__________________________________  ________________________________________________________________________

## 2024-06-06 NOTE — Progress Notes (Signed)
 COVID Vaccine received:  []  No [x]  Yes Date of any COVID positive Test in last 90 days:  PCP - Damian Christians, NP  Arnett Repress, MD Cardiologist -Darice Sharps, MD at [facility] cardiac clearance in 12-27-2023 note Pulmonology- Dorethia Cave, MD  clearance in 05-13-24 Note in Epic  Chest x-ray -  EKG -  2021   will repeat Stress Test -  ECHO - 12-08-2015  Epic Cardiac Cath - 04-07-2019  LHC- PCI / DES x1 at [facility] by Dr. Alverna Sieving, DO CT Coronary Calcium  score:  Pulm. Function tests- 05-13-2024 in Epic  Bowel Prep - []  No  []   Yes ______  Pacemaker / ICD device [x]  No []  Yes   Spinal Cord Stimulator:[x]  No []  Yes       History of Sleep Apnea? [x]  No []  Yes   CPAP used?- [x]  No []  Yes    Patient has: [x]  NO Hx DM   []  Pre-DM   []  DM1  []   DM2 Does the patient monitor blood sugar?   [x]  N/A   []  No []  Yes   Blood Thinner / Instructions:  none Aspirin  Instructions:  AS 81 mg  ERAS Protocol Ordered: []  No  [x]  Yes PRE-SURGERY []  ENSURE  []  G2   [x]  No Drink Ordered Patient is to be NPO after: 0830  Dental hx: []  Dentures:  []  N/A      []  Bridge or Partial:                   []  Loose or Damaged teeth:   Comments:   Activity level: Able to walk up 2 flights of stairs without becoming significantly short of breath or having chest pain?  []  No   []    Yes  Patient can perform ADLs without assistance. []  No   []   Yes  Anesthesia review: HTN, CAD-STEMI -LHC-DES x1 in 2020, Smoker - chronic resp. Failure, COPD, Bipolar , Cirrhosis  Patient denies any S&S of respiratory illness or Covid - no shortness of breath, fever, cough or chest pain at PAT appointment.  Patient verbalized understanding and agreement to the Pre-Surgical Instructions that were given to them at this PAT appointment. Patient was also educated of the need to review these PAT instructions again prior to his surgery.I reviewed the appropriate phone numbers to call if they have any and questions or concerns.

## 2024-06-07 ENCOUNTER — Encounter (HOSPITAL_COMMUNITY)
Admission: RE | Admit: 2024-06-07 | Discharge: 2024-06-07 | Disposition: A | Discharge status: Home / Self Care | Source: Ambulatory Visit | Attending: Surgery | Admitting: Surgery

## 2024-06-07 VITALS — BP 130/72 | HR 90 | Temp 98.0°F | Resp 20 | Ht 65.0 in | Wt 186.0 lb

## 2024-06-07 DIAGNOSIS — Z01818 Encounter for other preprocedural examination: Secondary | ICD-10-CM

## 2024-06-07 DIAGNOSIS — I10 Essential (primary) hypertension: Secondary | ICD-10-CM

## 2024-06-07 DIAGNOSIS — K769 Liver disease, unspecified: Secondary | ICD-10-CM

## 2024-06-10 ENCOUNTER — Encounter (HOSPITAL_COMMUNITY): Payer: Self-pay

## 2024-06-10 NOTE — Progress Notes (Addendum)
 Case: [id number] Date/Time: 06/11/24 1115   Procedure: CHOLECYSTECTOMY, ROBOT-ASSISTED, LAPAROSCOPIC - WITH ICG DYE   Anesthesia type: General   Diagnosis:      Liver mass [R16.0]     Cirrhosis of liver with ascites, unspecified hepatic cirrhosis type (HCC) [K74.60, R18.8]     Other specified diseases of liver [K76.89]     Biliary colic [K80.50]     Chronic obstructive pulmonary disease, unspecified COPD type (HCC) [J44.9]     Chronic respiratory failure with hypoxia (HCC) [J96.11]   Pre-op diagnosis: GALLSTONES   Location: [facility] ROOM 02 / [facility]   Surgeons: Dasie Leonor CROME, MD       DISCUSSION: Glen Lopez is a 68 yo male with PMH of current smoking, hx of STEMI and CAD s/p PCI to circumflex (2020), systolic CHF, severe COPD with chronic respiratory failure, GERD, hiatal hernia, cirrhosis, bipolar disorder, depression, anxiety, arthritis.  Patient follows with cardiology at [facility].  He had a STEMI in 2020 and underwent stenting to the circumflex.  Also mild systolic CHF with EF 45 to 50% by last echo in 05/2023.  He was last seen in clinic on 12/27/2023.  Blood pressure well-controlled.  On GDMT.  No additional cardiac testing recommended and advised to follow-up in 1 year. Cardiac clearance scanned in media on 8/11  Patient follows with pulmonology at [facility] for severe COPD and chronic respiratory failure.  His last seen in clinic on 05/13/2024 for follow-up and preop clearance.  He has chronic DOE.  He is still a smoker.  He uses inhalers and nebulizers.  He wears 2 L of oxygen  at night. Pre-bronchodilator lung function improved from 35% in 2019 to 41% at last office visit.  No recent exacerbations and breathing symptoms stable.  Pulmonary risk assessment provided:   Cholelithiasis present, pending cholecystectomy. Preoperative pulmonary evaluation today for surgical clearance. He will be considered intermediate-high risk for prolonged mechanical ventilation and/or post op pulmonary  complications.   VS: BP 130/72 Comment: right side sitting  Pulse 90   Temp 36.7 C (Oral)   Resp 20   Ht 5' 5 (1.651 m)   Wt 84.4 kg   SpO2 92%   BMI 30.95 kg/m   PROVIDERS: Campbell Reynolds, NP Cardiologist -Darice Sharps, MD at [facility] Pulmonology- Dorethia Cave, MD  LABS: Scanned in media on 06/07/24 from [facility]. Labs drawn 05/29/24. WBC 11.9, Hgb 19.5, platelets 169. Na 141, K 4.4, SCr 1.13 (GFR 71), LFTs WNL.   IMAGES:  CT chest 04/15/2024:  IMPRESSION: 1. Lung-RADS 2, benign appearance or behavior. Continue annual screening with low-dose chest CT without contrast in 12 months. 2. Two-vessel coronary atherosclerosis. 3. Cholelithiasis. 4. Cirrhosis. 5. Aortic Atherosclerosis (ICD10-I70.0) and Emphysema (ICD10-J43.9).    EKG 12/27/23 (scanned in media on 8/8)  Sinus tachycardia, rate 115 (pt had just used albuterol  per notes) LAE Otherwise normal When compared with ECG of 08-Apr-2019 08 27,  Vent. rate has increased BY  39 BPM  Non-specific change in ST segment in inferior leads  Non-specific change in ST segment in anterior leads  T wave inversion no longer evident in inferior leads  T wave inversion no longer evident in lateral leads   CV: Echo 05/11/23 ([facility]):  SUMMARY Left ventricular systolic function is mildly reduced. LV ejection fraction = 45-50%. LV Global L Strain =--16..1%. There is mild diastolic dysfunction, Grade IA, with normal left atrial pressure. There is basal LV inferolateral wall hypokinesis There is mid LV inferolateral wall hypokinesis The  right ventricle is normal in size and function. The atria are normal in size. A patent foramen ovale is suspected. The aortic valve is normal in structure and function. There is mild mitral regurgitation. IVC size was normal. There is no pericardial effusion. No significant change compared to prior study.  LHC 04/07/2019 ([facility]):  Conclusions  Diagnostic Summary  INDICATION   STEMI  1. Severe stenosis of the mid circumflex at the OM1 bifurcation.  2. Otherwise nonobstructive CAD.  3. Elevated LVEDP.  4. An LV gram was not done. Will get an echocardiogram.  Interventional Summary  1. Successful stenting of the circumflex into the OM with a 3.5-18 mm Xience  drug eluting stent.  2. Successful kissing balloon angioplasty to the distal circumflex with a 2.25  mm balloon.  After deployment of the circumflex-OM stent, there was plaque shift into the  distal circumflex. This was managed with a 2-step kissing balloon procedure  with a good result.  Interventional Recommendations  1. Recovery in the CCU.  2. Aggressive CVD risk factor modification.  3. Dual antiplatelet therapy with aspirin  and prasugrel has been started. This  should continue for 12 months with no elective interruptions for the first 6  months.  Past Medical History:  Diagnosis Date   Anxiety    Arthritis    Asthma    Bipolar disorder (HCC)    COPD (chronic obstructive pulmonary disease) (HCC)    Depression    GERD (gastroesophageal reflux disease)    Headache(784.0)    Hiatal hernia    Hypertension     Past Surgical History:  Procedure Laterality Date   artheroscopic knee surgery R Right 01/2013    MEDICATIONS:  traMADol  (ULTRAM ) 50 MG tablet   albuterol  (PROVENTIL ) (2.5 MG/3ML) 0.083% nebulizer solution   albuterol  (VENTOLIN  HFA) 108 (90 Base) MCG/ACT inhaler   ALPRAZolam  (XANAX ) 0.5 MG tablet   amLODipine  (NORVASC ) 10 MG tablet   aspirin  EC 81 MG tablet   atenolol  (TENORMIN ) 100 MG tablet   atorvastatin  (LIPITOR) 80 MG tablet   cetirizine (ZYRTEC) 10 MG tablet   ezetimibe  (ZETIA ) 10 MG tablet   famotidine  (PEPCID ) 20 MG tablet   Fluticasone -Umeclidin-Vilant (TRELEGY ELLIPTA ) 200-62.5-25 MCG/ACT AEPB   lisinopril  (ZESTRIL ) 5 MG tablet   nitroGLYCERIN  (NITROSTAT ) 0.4 MG SL tablet   pregabalin  (LYRICA ) 100 MG capsule   Respiratory Therapy Supplies (FLUTTER) DEVI   roflumilast   (DALIRESP ) 500 MCG TABS tablet   sertraline  (ZOLOFT ) 50 MG tablet   No current facility-administered medications for this encounter.    Burnard CHRISTELLA Odis DEVONNA [facility] Short Stay/Anesthesiology [facility] Phone [phone] 06/10/2024 9:18 AM

## 2024-06-11 ENCOUNTER — Other Ambulatory Visit: Payer: Self-pay

## 2024-06-11 ENCOUNTER — Encounter (HOSPITAL_COMMUNITY)
Admission: RE | Disposition: A | Discharge status: Home / Self Care | Payer: Self-pay | Source: Home / Self Care | Attending: Surgery

## 2024-06-11 ENCOUNTER — Observation Stay (HOSPITAL_COMMUNITY)
Admission: RE | Admit: 2024-06-11 | Discharge: 2024-06-12 | Disposition: A | Discharge status: Home / Self Care | Attending: Surgery | Admitting: Surgery

## 2024-06-11 ENCOUNTER — Ambulatory Visit (HOSPITAL_BASED_OUTPATIENT_CLINIC_OR_DEPARTMENT_OTHER): Admitting: Certified Registered Nurse Anesthetist

## 2024-06-11 ENCOUNTER — Ambulatory Visit (HOSPITAL_COMMUNITY): Admitting: Medical

## 2024-06-11 ENCOUNTER — Encounter (HOSPITAL_COMMUNITY): Payer: Self-pay | Admitting: Surgery

## 2024-06-11 DIAGNOSIS — I2510 Atherosclerotic heart disease of native coronary artery without angina pectoris: Secondary | ICD-10-CM | POA: Diagnosis not present

## 2024-06-11 DIAGNOSIS — I10 Essential (primary) hypertension: Secondary | ICD-10-CM | POA: Diagnosis not present

## 2024-06-11 DIAGNOSIS — Z7982 Long term (current) use of aspirin: Secondary | ICD-10-CM | POA: Diagnosis not present

## 2024-06-11 DIAGNOSIS — K429 Umbilical hernia without obstruction or gangrene: Secondary | ICD-10-CM

## 2024-06-11 DIAGNOSIS — K7460 Unspecified cirrhosis of liver: Secondary | ICD-10-CM

## 2024-06-11 DIAGNOSIS — K8020 Calculus of gallbladder without cholecystitis without obstruction: Secondary | ICD-10-CM

## 2024-06-11 DIAGNOSIS — R932 Abnormal findings on diagnostic imaging of liver and biliary tract: Secondary | ICD-10-CM | POA: Insufficient documentation

## 2024-06-11 DIAGNOSIS — J449 Chronic obstructive pulmonary disease, unspecified: Secondary | ICD-10-CM | POA: Diagnosis not present

## 2024-06-11 DIAGNOSIS — F12920 Cannabis use, unspecified with intoxication, uncomplicated: Secondary | ICD-10-CM | POA: Diagnosis not present

## 2024-06-11 DIAGNOSIS — K8010 Calculus of gallbladder with chronic cholecystitis without obstruction: Principal | ICD-10-CM | POA: Insufficient documentation

## 2024-06-11 DIAGNOSIS — F17210 Nicotine dependence, cigarettes, uncomplicated: Secondary | ICD-10-CM | POA: Diagnosis not present

## 2024-06-11 SURGERY — CHOLECYSTECTOMY, ROBOT-ASSISTED, LAPAROSCOPIC
Anesthesia: General

## 2024-06-11 MED ORDER — PHENYLEPHRINE HCL (PRESSORS) 10 MG/ML IV SOLN
INTRAVENOUS | Status: AC
Start: 2024-06-11 — End: 2024-06-11
  Filled 2024-06-11: qty 1

## 2024-06-11 MED ORDER — ORAL CARE MOUTH RINSE: 15.0000 mL | Freq: Once | OROMUCOSAL | Status: AC

## 2024-06-11 MED ORDER — FENTANYL CITRATE (PF) 100 MCG/2ML IJ SOLN
INTRAMUSCULAR | Status: AC
Start: 2024-06-11 — End: 2024-06-11
  Filled 2024-06-11: qty 2

## 2024-06-11 MED ORDER — SUGAMMADEX SODIUM 200 MG/2ML IV SOLN
INTRAVENOUS | Status: DC | PRN
  Administered 2024-06-11 (×2): 200 mg via INTRAVENOUS

## 2024-06-11 MED ORDER — ALPRAZOLAM 0.5 MG PO TABS
0.5000 mg | ORAL_TABLET | Freq: Three times a day (TID) | ORAL | Status: DC | PRN
  Administered 2024-06-11 – 2024-06-12 (×4): 0.5 mg via ORAL
  Filled 2024-06-11 (×2): qty 1

## 2024-06-11 MED ORDER — PREGABALIN 25 MG PO CAPS
100.0000 mg | ORAL_CAPSULE | Freq: Two times a day (BID) | ORAL | Status: DC
  Administered 2024-06-11 (×2): 100 mg via ORAL
  Filled 2024-06-11: qty 4

## 2024-06-11 MED ORDER — ROCURONIUM BROMIDE 10 MG/ML (PF) SYRINGE
PREFILLED_SYRINGE | INTRAVENOUS | Status: DC | PRN
Start: 2024-06-11 — End: 2024-06-11
  Administered 2024-06-11: 20 mg via INTRAVENOUS
  Administered 2024-06-11 (×2): 50 mg via INTRAVENOUS
  Administered 2024-06-11: 20 mg via INTRAVENOUS

## 2024-06-11 MED ORDER — LIDOCAINE HCL (PF) 2 % IJ SOLN
INTRAMUSCULAR | Status: AC
  Filled 2024-06-11: qty 5

## 2024-06-11 MED ORDER — LACTATED RINGERS IR SOLN
Status: DC | PRN
Start: 2024-06-11 — End: 2024-06-11
  Administered 2024-06-11 (×2): 1

## 2024-06-11 MED ORDER — PHENYLEPHRINE HCL-NACL 20-0.9 MG/250ML-% IV SOLN
INTRAVENOUS | Status: DC | PRN
  Administered 2024-06-11 (×2): 40 ug/min via INTRAVENOUS

## 2024-06-11 MED ORDER — ONDANSETRON 4 MG PO TBDP: 4.0000 mg | ORAL_TABLET | Freq: Four times a day (QID) | ORAL | Status: DC | PRN

## 2024-06-11 MED ORDER — DIPHENHYDRAMINE HCL 50 MG/ML IJ SOLN: 12.5000 mg | Freq: Four times a day (QID) | INTRAMUSCULAR | Status: DC | PRN

## 2024-06-11 MED ORDER — IPRATROPIUM-ALBUTEROL 0.5-2.5 (3) MG/3ML IN SOLN: 3.0000 mL | RESPIRATORY_TRACT | Status: DC

## 2024-06-11 MED ORDER — AMLODIPINE BESYLATE 10 MG PO TABS: 10.0000 mg | ORAL_TABLET | Freq: Every day | ORAL | Status: DC

## 2024-06-11 MED ORDER — DIPHENHYDRAMINE HCL 12.5 MG/5ML PO ELIX: 12.5000 mg | ORAL_SOLUTION | Freq: Four times a day (QID) | ORAL | Status: DC | PRN

## 2024-06-11 MED ORDER — LACTATED RINGERS IV SOLN: INTRAVENOUS | Status: DC

## 2024-06-11 MED ORDER — DEXAMETHASONE SODIUM PHOSPHATE 10 MG/ML IJ SOLN
INTRAMUSCULAR | Status: AC
  Filled 2024-06-11: qty 1

## 2024-06-11 MED ORDER — POLYETHYLENE GLYCOL 3350 17 G PO PACK
17.0000 g | PACK | Freq: Every day | ORAL | Status: DC | PRN
Start: 2024-06-11 — End: 2024-06-12

## 2024-06-11 MED ORDER — ALBUTEROL SULFATE (2.5 MG/3ML) 0.083% IN NEBU: 2.5000 mg | INHALATION_SOLUTION | Freq: Four times a day (QID) | RESPIRATORY_TRACT | Status: DC | PRN

## 2024-06-11 MED ORDER — BUPIVACAINE-EPINEPHRINE (PF) 0.25% -1:200000 IJ SOLN
INTRAMUSCULAR | Status: AC
  Filled 2024-06-11: qty 30

## 2024-06-11 MED ORDER — MORPHINE SULFATE (PF) 2 MG/ML IV SOLN
2.0000 mg | INTRAVENOUS | Status: DC | PRN
  Administered 2024-06-11 (×2): 2 mg via INTRAVENOUS
  Filled 2024-06-11: qty 1

## 2024-06-11 MED ORDER — LIDOCAINE HCL (PF) 2 % IJ SOLN
INTRAMUSCULAR | Status: DC | PRN
  Administered 2024-06-11 (×2): 60 mg via INTRADERMAL

## 2024-06-11 MED ORDER — BUPIVACAINE-EPINEPHRINE 0.25% -1:200000 IJ SOLN
INTRAMUSCULAR | Status: DC | PRN
  Administered 2024-06-11 (×2): 30 mL

## 2024-06-11 MED ORDER — SERTRALINE HCL 50 MG PO TABS: 50.0000 mg | ORAL_TABLET | Freq: Every day | ORAL | Status: DC

## 2024-06-11 MED ORDER — EPHEDRINE SULFATE-NACL 50-0.9 MG/10ML-% IV SOSY
PREFILLED_SYRINGE | INTRAVENOUS | Status: DC | PRN
  Administered 2024-06-11 (×8): 5 mg via INTRAVENOUS

## 2024-06-11 MED ORDER — ENOXAPARIN SODIUM 40 MG/0.4ML IJ SOSY
40.0000 mg | PREFILLED_SYRINGE | INTRAMUSCULAR | Status: DC
  Administered 2024-06-12 (×2): 40 mg via SUBCUTANEOUS
  Filled 2024-06-11: qty 0.4

## 2024-06-11 MED ORDER — ALBUTEROL SULFATE (2.5 MG/3ML) 0.083% IN NEBU
2.5000 mg | INHALATION_SOLUTION | Freq: Once | RESPIRATORY_TRACT | Status: AC
  Administered 2024-06-11 (×2): 2.5 mg via RESPIRATORY_TRACT

## 2024-06-11 MED ORDER — INDOCYANINE GREEN 25 MG IV SOLR
1.2500 mg | Freq: Once | INTRAVENOUS | Status: AC
  Administered 2024-06-11 (×2): 1.25 mg via INTRAVENOUS
  Filled 2024-06-11: qty 10

## 2024-06-11 MED ORDER — ACETAMINOPHEN 10 MG/ML IV SOLN
INTRAVENOUS | Status: DC | PRN
  Administered 2024-06-11 (×2): 1000 mg via INTRAVENOUS

## 2024-06-11 MED ORDER — ATORVASTATIN CALCIUM 40 MG PO TABS
80.0000 mg | ORAL_TABLET | Freq: Every day | ORAL | Status: DC
  Administered 2024-06-11 (×2): 80 mg via ORAL
  Filled 2024-06-11: qty 4

## 2024-06-11 MED ORDER — DOCUSATE SODIUM 100 MG PO CAPS
100.0000 mg | ORAL_CAPSULE | Freq: Two times a day (BID) | ORAL | Status: DC
  Administered 2024-06-11 (×2): 100 mg via ORAL
  Filled 2024-06-11: qty 1

## 2024-06-11 MED ORDER — PROPOFOL 10 MG/ML IV BOLUS
INTRAVENOUS | Status: DC | PRN
  Administered 2024-06-11 (×2): 130 mg via INTRAVENOUS

## 2024-06-11 MED ORDER — CEFAZOLIN SODIUM-DEXTROSE 2-4 GM/100ML-% IV SOLN
2.0000 g | INTRAVENOUS | Status: AC
  Administered 2024-06-11 (×2): 2 g via INTRAVENOUS
  Filled 2024-06-11: qty 100

## 2024-06-11 MED ORDER — PHENYLEPHRINE HCL (PRESSORS) 10 MG/ML IV SOLN
INTRAVENOUS | Status: AC
  Filled 2024-06-11: qty 1

## 2024-06-11 MED ORDER — ROFLUMILAST 500 MCG PO TABS
500.0000 ug | ORAL_TABLET | Freq: Every day | ORAL | Status: DC
  Filled 2024-06-11: qty 1

## 2024-06-11 MED ORDER — 0.9 % SODIUM CHLORIDE (POUR BTL) OPTIME
TOPICAL | Status: DC | PRN
Start: 2024-06-11 — End: 2024-06-11
  Administered 2024-06-11 (×2): 1000 mL

## 2024-06-11 MED ORDER — IPRATROPIUM-ALBUTEROL 0.5-2.5 (3) MG/3ML IN SOLN
3.0000 mL | Freq: Once | RESPIRATORY_TRACT | Status: DC
  Filled 2024-06-11: qty 3

## 2024-06-11 MED ORDER — HYDROMORPHONE HCL 1 MG/ML IJ SOLN
INTRAMUSCULAR | Status: AC
Start: 2024-06-11 — End: 2024-06-11
  Filled 2024-06-11: qty 2

## 2024-06-11 MED ORDER — EZETIMIBE 10 MG PO TABS: 10.0000 mg | ORAL_TABLET | Freq: Every day | ORAL | Status: DC

## 2024-06-11 MED ORDER — ONDANSETRON HCL 4 MG/2ML IJ SOLN: 4.0000 mg | Freq: Four times a day (QID) | INTRAMUSCULAR | Status: DC | PRN

## 2024-06-11 MED ORDER — PHENYLEPHRINE 80 MCG/ML (10ML) SYRINGE FOR IV PUSH (FOR BLOOD PRESSURE SUPPORT)
PREFILLED_SYRINGE | INTRAVENOUS | Status: DC | PRN
  Administered 2024-06-11 (×2): 160 ug via INTRAVENOUS
  Administered 2024-06-11 (×2): 80 ug via INTRAVENOUS
  Administered 2024-06-11: 160 ug via INTRAVENOUS
  Administered 2024-06-11 (×4): 80 ug via INTRAVENOUS
  Administered 2024-06-11: 160 ug via INTRAVENOUS

## 2024-06-11 MED ORDER — ROCURONIUM BROMIDE 10 MG/ML (PF) SYRINGE
PREFILLED_SYRINGE | INTRAVENOUS | Status: AC
Start: 2024-06-11 — End: 2024-06-11
  Filled 2024-06-11: qty 10

## 2024-06-11 MED ORDER — ACETAMINOPHEN 10 MG/ML IV SOLN
INTRAVENOUS | Status: AC
Start: 2024-06-11 — End: 2024-06-11
  Filled 2024-06-11: qty 100

## 2024-06-11 MED ORDER — ATENOLOL 100 MG PO TABS
100.0000 mg | ORAL_TABLET | Freq: Every day | ORAL | Status: DC
  Filled 2024-06-11: qty 1
  Filled 2024-06-11: qty 4
  Filled 2024-06-11: qty 1
  Filled 2024-06-11: qty 2

## 2024-06-11 MED ORDER — FAMOTIDINE 20 MG PO TABS: 20.0000 mg | ORAL_TABLET | Freq: Every day | ORAL | Status: DC

## 2024-06-11 MED ORDER — ALBUTEROL SULFATE (2.5 MG/3ML) 0.083% IN NEBU
INHALATION_SOLUTION | RESPIRATORY_TRACT | Status: AC
Start: 2024-06-11 — End: 2024-06-11
  Filled 2024-06-11: qty 3

## 2024-06-11 MED ORDER — OXYCODONE HCL 5 MG PO TABS
5.0000 mg | ORAL_TABLET | ORAL | Status: DC | PRN
  Filled 2024-06-11: qty 2

## 2024-06-11 MED ORDER — HYDROMORPHONE HCL 1 MG/ML IJ SOLN
0.2500 mg | INTRAMUSCULAR | Status: DC | PRN
  Administered 2024-06-11 (×6): 0.5 mg via INTRAVENOUS

## 2024-06-11 MED ORDER — METHOCARBAMOL 500 MG PO TABS: 500.0000 mg | ORAL_TABLET | Freq: Four times a day (QID) | ORAL | Status: DC | PRN

## 2024-06-11 MED ORDER — ASPIRIN 81 MG PO TBEC: 81.0000 mg | DELAYED_RELEASE_TABLET | Freq: Every day | ORAL | Status: DC

## 2024-06-11 MED ORDER — BUDESON-GLYCOPYRROL-FORMOTEROL 160-9-4.8 MCG/ACT IN AERO
2.0000 | INHALATION_SPRAY | Freq: Two times a day (BID) | RESPIRATORY_TRACT | Status: DC
  Administered 2024-06-11 – 2024-06-12 (×4): 2 via RESPIRATORY_TRACT
  Filled 2024-06-11: qty 5.9

## 2024-06-11 MED ORDER — ATENOLOL 100 MG PO TABS
100.0000 mg | ORAL_TABLET | ORAL | Status: AC
  Administered 2024-06-11 (×2): 100 mg via ORAL
  Filled 2024-06-11: qty 1

## 2024-06-11 MED ORDER — CHLORHEXIDINE GLUCONATE 0.12 % MT SOLN
15.0000 mL | Freq: Once | OROMUCOSAL | Status: AC
  Administered 2024-06-11 (×2): 15 mL via OROMUCOSAL

## 2024-06-11 MED ORDER — FENTANYL CITRATE (PF) 100 MCG/2ML IJ SOLN
INTRAMUSCULAR | Status: DC | PRN
  Administered 2024-06-11 (×2): 100 ug via INTRAVENOUS

## 2024-06-11 MED ORDER — PROPOFOL 10 MG/ML IV BOLUS
INTRAVENOUS | Status: AC
  Filled 2024-06-11: qty 20

## 2024-06-11 MED ORDER — ONDANSETRON HCL 4 MG/2ML IJ SOLN
INTRAMUSCULAR | Status: AC
  Filled 2024-06-11: qty 2

## 2024-06-11 SURGICAL SUPPLY — 47 items
BAG COUNTER SPONGE SURGICOUNT (BAG) IMPLANT
BLADE SURG 15 STRL LF DISP TIS (BLADE) ×1 IMPLANT
CHLORAPREP W/TINT 26 (MISCELLANEOUS) ×1 IMPLANT
CLIP APPLIE 5 13 M/L LIGAMAX5 (MISCELLANEOUS) IMPLANT
CLIP LIGATING HEM O LOK PURPLE (MISCELLANEOUS) ×1 IMPLANT
CLIP LIGATING HEMO O LOK GREEN (MISCELLANEOUS) IMPLANT
CNTNR URN SCR LID CUP LEK RST (MISCELLANEOUS) ×1 IMPLANT
COVER MAYO STAND STRL (DRAPES) IMPLANT
COVER SURGICAL LIGHT HANDLE (MISCELLANEOUS) ×1 IMPLANT
COVER TIP SHEARS 8 DVNC (MISCELLANEOUS) IMPLANT
DEFOGGER SCOPE WARM SEASHARP (MISCELLANEOUS) IMPLANT
DERMABOND ADVANCED .7 DNX12 (GAUZE/BANDAGES/DRESSINGS) ×1 IMPLANT
DERMABOND ADVANCED .7 DNX6 (GAUZE/BANDAGES/DRESSINGS) IMPLANT
DRAPE ARM DVNC X/XI (DISPOSABLE) ×4 IMPLANT
DRAPE C-ARM 42X120 X-RAY (DRAPES) IMPLANT
DRAPE COLUMN DVNC XI (DISPOSABLE) ×1 IMPLANT
DRIVER NDL LRG 8 DVNC XI (INSTRUMENTS) ×1 IMPLANT
DRIVER NDLE LRG 8 DVNC XI (INSTRUMENTS) ×1 IMPLANT
ELECT REM PT RETURN 15FT ADLT (MISCELLANEOUS) ×1 IMPLANT
GLOVE BIO SURGEON STRL SZ 6.5 (GLOVE) ×2 IMPLANT
GLOVE INDICATOR 6.5 STRL GRN (GLOVE) ×3 IMPLANT
GOWN STRL REUS W/ TWL XL LVL3 (GOWN DISPOSABLE) ×2 IMPLANT
GRASPER SUT TROCAR 14GX15 (MISCELLANEOUS) IMPLANT
GRASPER TIP-UP FEN DVNC XI (INSTRUMENTS) ×1 IMPLANT
IV CATH 14GX2 1/4 (CATHETERS) IMPLANT
KIT BASIN OR (CUSTOM PROCEDURE TRAY) ×1 IMPLANT
KIT TURNOVER KIT A (KITS) ×1 IMPLANT
NDL HYPO 22X1.5 SAFETY MO (MISCELLANEOUS) ×1 IMPLANT
NDL INSUFFLATION 14GA 120MM (NEEDLE) ×1 IMPLANT
NEEDLE HYPO 22X1.5 SAFETY MO (MISCELLANEOUS) ×1 IMPLANT
NEEDLE INSUFFLATION 14GA 120MM (NEEDLE) ×1 IMPLANT
PACK CARDIOVASCULAR III (CUSTOM PROCEDURE TRAY) ×1 IMPLANT
SCISSORS MNPLR CVD DVNC XI (INSTRUMENTS) ×1 IMPLANT
SEAL UNIV 5-12 XI (MISCELLANEOUS) ×3 IMPLANT
SEALER VESSEL EXT DVNC XI (MISCELLANEOUS) IMPLANT
SET CHOLANGIOGRAPH MIX (MISCELLANEOUS) IMPLANT
SOLUTION ANTFG W/FOAM PAD STRL (MISCELLANEOUS) ×1 IMPLANT
SOLUTION ELECTROSURG ANTI STCK (MISCELLANEOUS) ×1 IMPLANT
SPIKE FLUID TRANSFER (MISCELLANEOUS) ×1 IMPLANT
SUT VIC AB 2-0 SH 27X BRD (SUTURE) ×1 IMPLANT
SUT VIC AB 4-0 PS2 18 (SUTURE) ×1 IMPLANT
SYR 20ML ECCENTRIC (SYRINGE) ×1 IMPLANT
SYR 20ML LL LF (SYRINGE) ×1 IMPLANT
SYSTEM RETRIEVL 5MM INZII UNIV (BASKET) IMPLANT
TOWEL OR 17X26 10 PK STRL BLUE (TOWEL DISPOSABLE) ×1 IMPLANT
TROCAR ADV FIXATION 5X100MM (TROCAR) IMPLANT
TUBING INSUFFLATION 10FT LAP (TUBING) ×2 IMPLANT

## 2024-06-11 NOTE — Anesthesia Postprocedure Evaluation (Signed)
 Anesthesia Post Note  Patient: Glen Lopez  Procedure(s) Performed: CHOLECYSTECTOMY, ROBOT-ASSISTED, LAPAROSCOPIC     Patient location during evaluation: PACU Anesthesia Type: General Level of consciousness: awake and alert Pain management: pain level controlled Vital Signs Assessment: post-procedure vital signs reviewed and stable Respiratory status: spontaneous breathing, nonlabored ventilation, respiratory function stable and patient connected to nasal cannula oxygen  Cardiovascular status: blood pressure returned to baseline and stable Postop Assessment: no apparent nausea or vomiting Anesthetic complications: no   No notable events documented.  Last Vitals:  Vitals:   06/11/24 1412 06/11/24 1415  BP:  (!) 108/54  Pulse: 67 65  Resp: 13 13  Temp:    SpO2: (!) 88% 92%    Last Pain:  Vitals:   06/11/24 1415  TempSrc:   PainSc: Asleep                 Jan Walters,W. EDMOND

## 2024-06-11 NOTE — Plan of Care (Signed)

## 2024-06-11 NOTE — Anesthesia Preprocedure Evaluation (Addendum)
 Anesthesia Evaluation  Patient identified by MRN, date of birth, ID band Patient awake    Reviewed: Allergy & Precautions, H&P , NPO status , Patient's Chart, lab work & pertinent test results, reviewed documented beta blocker date and time   Airway Mallampati: II  TM Distance: >3 FB Neck ROM: Full    Dental no notable dental hx. (+) Edentulous Upper, Edentulous Lower, Dental Advisory Given   Pulmonary asthma , COPD,  COPD inhaler, Current Smoker and Patient abstained from smoking.   Pulmonary exam normal breath sounds clear to auscultation       Cardiovascular hypertension, Pt. on medications and Pt. on home beta blockers + CAD and + Cardiac Stents   Rhythm:Regular Rate:Normal     Neuro/Psych  Headaches  Anxiety Depression Bipolar Disorder   negative neurological ROS  negative psych ROS   GI/Hepatic Neg liver ROS, hiatal hernia,GERD  Medicated,,(+) Cirrhosis         Endo/Other  negative endocrine ROS    Renal/GU negative Renal ROS  negative genitourinary   Musculoskeletal  (+) Arthritis , Osteoarthritis,    Abdominal   Peds  Hematology negative hematology ROS (+)   Anesthesia Other Findings   Reproductive/Obstetrics negative OB ROS                              Anesthesia Physical Anesthesia Plan  ASA: 3  Anesthesia Plan: General   Post-op Pain Management: Ofirmev  IV (intra-op)*   Induction: Intravenous  PONV Risk Score and Plan: 2 and Ondansetron , Dexamethasone  and Treatment may vary due to age or medical condition  Airway Management Planned: Oral ETT  Additional Equipment:   Intra-op Plan:   Post-operative Plan: Extubation in OR  Informed Consent: I have reviewed the patients History and Physical, chart, labs and discussed the procedure including the risks, benefits and alternatives for the proposed anesthesia with the patient or authorized representative who has  indicated his/her understanding and acceptance.     Dental advisory given  Plan Discussed with: CRNA  Anesthesia Plan Comments:          Anesthesia Quick Evaluation

## 2024-06-11 NOTE — Op Note (Signed)
 Date: 06/11/24  Patient: Glen Lopez MRN: [id number]  Preoperative Diagnosis: Symptomatic cholelithiasis Postoperative Diagnosis: Same  Procedure: Robotic-assisted laparoscopic cholecystectomy with fluorescence ICG cholangiography  Surgeon: Leonor Dawn, MD  EBL: Minimal   Anesthesia: General endotracheal  Specimens: Gallbladder  Indications: Glen Lopez is a 68 yo male who was referred with frequent RUQ abdominal pain, worse after eating. He has known cirrhosis with a LiRADS-3 liver lesion, and surveillance MRI also showed gallstones. After a discussion of the risks and benefits of surgery, he agreed to proceed with cholecystectomy.  Findings: Cholelithiasis without signs of acute cholecystitis. Cirrhosis of the liver. Small umbilical hernia, approximately 1cm in diameter.  Procedure details: Informed consent was obtained in the preoperative area prior to the procedure. The patient was brought to the operating room and placed on the table in the supine position. General anesthesia was induced and appropriate lines and drains were placed for intraoperative monitoring. Perioperative antibiotics were administered per SCIP guidelines. The abdomen was prepped and draped in the usual sterile fashion. A pre-procedure timeout was taken verifying patient identity, surgical site and procedure to be performed.  A curvilinear supraumbilical skin incision was created and the underlying umbilical hernia sac was opened.  There was a small hernia defect that was approximately 1cm in size.  A 12mm robotic trocar was placed through this defect and the abdomen was insufflated.  The abdomen was inspected with no evidence of visceral or vascular injury.  An 8 mm port was placed in the right mid abdomen, followed by two 8mm ports in the left mid abdomen, all under direct visualization.  The patient was placed in reverse Trendelenburg position and the robot was docked to the patient.  The liver appeared  cirrhotic but there was no ascites within the abdomen.  The fundus of the gallbladder was grasped and retracted cephalad.  The infundibulum was retracted laterally and the peritoneum overlying the gallbladder was incised with cautery.  The cystic triangle was dissected out using blunt dissection and cautery. ICG was used to confirm the biliary anatomy. The cystic artery and cystic duct were circumferentially dissected out and the lower third of the gallbladder was taken off the cystic plate using cautery.  The critical view of safety was obtained, and the cystic duct and cystic artery were clipped and divided, leaving two clips behind on the cystic duct stump.  The gallbladder was taken off the liver using cautery.  There was some spillage of bile from the gallbladder during this dissection. Hemostasis was achieved in the gallbladder fossa using cautery.  The cystic duct and cystic artery stumps were visualized and there was no evidence of bleeding or bile leak.  The robot was undocked from the patient.  The gallbladder was placed in Endo Catch bag and extracted via the umbilical port site.  The surgical site was copiously irrigated with sterile saline.  The pneumoperitoneum was evacuated and the ports were removed.  The umbilical port site fascia was closed with 0 Vicryl figure-of-eight suture.  The skin at each port site was closed with 4-0 monocryl subcuticular suture.  Dermabond was applied.  The patient tolerated the procedure well with no apparent complications.  All counts were correct x2 at the end of the procedure. The patient was extubated and taken to PACU in stable condition.  Leonor Dawn, MD 06/11/24 1:44 PM

## 2024-06-11 NOTE — Transfer of Care (Signed)
 Immediate Anesthesia Transfer of Care Note  Patient: Glen Lopez  Procedure(s) Performed: Procedure(s) with comments: CHOLECYSTECTOMY, ROBOT-ASSISTED, LAPAROSCOPIC (N/A) - WITH ICG DYE  Patient Location: PACU  Anesthesia Type:General  Level of Consciousness: Patient easily awoken, comfortable, cooperative, following commands, responds to stimulation.   Airway & Oxygen  Therapy: Patient spontaneously breathing, ventilating well, oxygen  via simple oxygen  mask.  Post-op Assessment: Report given to PACU RN, vital signs reviewed and stable, moving all extremities.   Post vital signs: Reviewed and stable.  Complications: No apparent anesthesia complications Last Vitals:  Vitals Value Taken Time  BP 116/69 06/11/24 1350  Temp    Pulse 81 06/11/24 13:46  Resp 17 06/11/24 13:46  SpO2 97 % 06/11/24 13:46  Vitals shown include unfiled device data.  Last Pain:  Vitals:   06/11/24 0959  TempSrc:   PainSc: 4       Patients Stated Pain Goal: 4 (06/11/24 0959)  Complications: No notable events documented.

## 2024-06-11 NOTE — H&P (Signed)
 Glen Lopez is an 68 y.o. male.   Chief Complaint: abdominal pain, gallstones HPI: Glen Lopez is a 68 y.o. male who is seen today for follow up of gallstones. He was first seen by Dr. Belinda and referred to me due to his history of cirrhosis. For the past few months he has having right upper quadrant pain which radiates around the flank to the back.  It has become more frequent and now occurs daily.  It gets worse after eating.  He has adjusted his diet and been avoiding fatty foods.  He has cirrhosis and follows with [facility].  Most recently had labs in May of this year which showed normal LFTs and a normal INR of 1.1. His AFP was also normal at 3.3. He has been undergoing MRI for surveillance of an L3 liver lesion, most recently in January of this year. This showed a 1.5cm LiRADS-3 lesion in the caudate lobe. This had grown slightly since his previous study in February 2024. After his office visit with me on 7/2, he had a liver MRI on 7/14 which showed a stable LiRADS-3 lesion in the liver. He also saw his pulmonologist and was stratified as moderate to  high risk for periop respiratory complications.   He says he quit drinking many years ago. He is a current smoker and has COPD, and uses oxygen  at night. He has chronic dyspnea on exertion. He follows with pulmonology. He also has a history of CAD with NSTEMI in 2020, and follows with cardiology at [facility]. According to outside notes his last echo in 2024 showed an LVEF of 45-50%, with basal and inferolateral hypokinesis. He has not had any prior abdominal surgeries.   Past Medical History:  Diagnosis Date   Anxiety    Arthritis    Asthma    Bipolar disorder (HCC)    COPD (chronic obstructive pulmonary disease) (HCC)    Depression    GERD (gastroesophageal reflux disease)    Headache(784.0)    Hiatal hernia    Hypertension     Past Surgical History:  Procedure Laterality Date   artheroscopic knee surgery R Right 01/2013     Family History  Problem Relation Age of Onset   Heart disease Father        MI at age 8   Asthma Sister    Social History:  reports that he has been smoking cigarettes. He has a 150 pack-year smoking history. He has been exposed to tobacco smoke. He has never used smokeless tobacco. He reports current drug use. Drug: Cocaine. He reports that he does not drink alcohol.  Allergies:  Allergies  Allergen Reactions   Codeine Nausea And Vomiting    Medications Prior to Admission  Medication Sig Dispense Refill   albuterol  (PROVENTIL ) (2.5 MG/3ML) 0.083% nebulizer solution Take 3 mLs (2.5 mg total) by nebulization every 6 (six) hours as needed. For shortness of breath 75 mL 5   albuterol  (VENTOLIN  HFA) 108 (90 Base) MCG/ACT inhaler Inhale 2 puffs into the lungs every 6 (six) hours as needed for wheezing or shortness of breath. 18 g 3   ALPRAZolam  (XANAX ) 0.5 MG tablet Take 1 tablet (0.5 mg total) by mouth 3 (three) times daily as needed for panic attacks (Patient taking differently: Take 0.5 mg by mouth 3 (three) times daily as needed for anxiety.) 90 tablet 3   amLODipine  (NORVASC ) 10 MG tablet Take 10 mg by mouth in the morning.     aspirin  EC 81 MG  tablet Take 1 tablet (81 mg total) by mouth daily.     atenolol  (TENORMIN ) 100 MG tablet Take 100 mg by mouth in the morning.     atorvastatin  (LIPITOR) 80 MG tablet Take 1 tablet (80 mg total) by mouth daily at 6pm. (Patient taking differently: Take 80 mg by mouth in the morning.) 30 tablet 11   cetirizine (ZYRTEC) 10 MG tablet Take 10 mg by mouth every evening.     ezetimibe  (ZETIA ) 10 MG tablet Take 10 mg by mouth in the morning.     famotidine  (PEPCID ) 20 MG tablet TAKE 1 TABLET(20 MG) BY MOUTH AT BEDTIME (Patient taking differently: Take 20 mg by mouth in the morning.) 30 tablet 5   Fluticasone -Umeclidin-Vilant (TRELEGY ELLIPTA ) 200-62.5-25 MCG/ACT AEPB Inhale 1 puff into the lungs daily. 60 each 11   lisinopril  (ZESTRIL ) 5 MG tablet  Take 1 tablet (5 mg total) by mouth daily. 90 tablet 3   pregabalin  (LYRICA ) 100 MG capsule Take 100 mg by mouth 2 (two) times daily.     Respiratory Therapy Supplies (FLUTTER) DEVI Use 3 times a day. 1 each 0   roflumilast  (DALIRESP ) 500 MCG TABS tablet Take 1 tablet (500 mcg total) by mouth daily. 30 tablet 3   sertraline  (ZOLOFT ) 50 MG tablet Take 1 tablet (50 mg total) by mouth daily. 90 tablet 3   traMADol  (ULTRAM ) 50 MG tablet Take 50 mg by mouth every 6 (six) hours as needed for moderate pain (pain score 4-6). Rx by Dr. Dasie 1 month ago, patient still has a few     nitroGLYCERIN  (NITROSTAT ) 0.4 MG SL tablet Place 1 tablet (0.4 mg) under your tongue for chest pain, every 5 min x3 doses. Call EMS on your second dose. 25 tablet 11    No results found for this or any previous visit (from the past 48 hours). No results found.  Review of Systems  Blood pressure 131/89, pulse 94, temperature 98.5 F (36.9 C), temperature source Oral, resp. rate 17, height 5' 5 (1.651 m), weight 84.4 kg, SpO2 94%. Physical Exam Vitals reviewed.  Constitutional:      General: He is not in acute distress.    Appearance: Normal appearance.  HENT:     Head: Normocephalic and atraumatic.  Pulmonary:     Effort: Pulmonary effort is normal. No respiratory distress.     Comments: On nasal cannula. Abdominal:     General: There is no distension.     Palpations: Abdomen is soft.     Tenderness: There is no abdominal tenderness.  Skin:    General: Skin is warm and dry.     Coloration: Skin is not jaundiced.  Neurological:     General: No focal deficit present.     Mental Status: He is alert and oriented to person, place, and time.      Assessment/Plan 68 yo male with compensated cirrhosis and symptomatic cholelithiasis. He presents today for elective cholecystectomy. He received cardiac and pulmonary risk stratification. Will observe overnight to monitor for respiratory complications given his COPD and  oxygen  requirements. Anticipate discharge home tomorrow if he is stable. The procedure details were reviewed and informed consent has been obtained.  Leonor LITTIE Dasie, MD 06/11/2024, 11:43 AM

## 2024-06-11 NOTE — Discharge Instructions (Addendum)
 [facility] DISCHARGE INSTRUCTIONS  Activity No heavy lifting greater than 15 pounds for 4 weeks after surgery. Ok to shower in 24 hours, but do not bathe or submerge incisions underwater. Do not drive while taking narcotic pain medication. You may drive when you are no longer taking prescription pain medication, you can comfortably wear a seatbelt, and you can safely maneuver your car and apply brakes.  Wound Care Your incisions are covered with skin glue called Dermabond. This will peel off on its own over time. You may shower and allow warm soapy water to run over your incisions. Gently pat dry. Do not submerge your incision underwater until cleared by your surgeon. Monitor your incision for any new redness, tenderness, or drainage. Many patients will experience some swelling and bruising at the incisions.  Ice packs will help.  Swelling and bruising can take several days to resolve.   Medications A  prescription for pain medication may be given to you upon discharge.  Take your pain medication as prescribed, if needed.  If narcotic pain medicine is not needed, then you may take acetaminophen  (Tylenol ) or ibuprofen (Advil) as needed. It is common to experience some constipation if taking pain medication after surgery.  Increasing fluid intake and taking a stool softener (such as Colace) will usually help or prevent this problem from occurring.  A mild laxative (Milk of Magnesia or Miralax ) should be taken according to package directions if there are no bowel movements after 48 hours. Take your usually prescribed medications unless otherwise directed. If you need a refill on your pain medication, please contact your pharmacy.  They will contact our office to request authorization. Prescriptions will not be filled after 5 pm or on weekends.  When to Call Us : Fever greater than 100.5 New redness, drainage, or swelling at incision site Severe pain, nausea, or  vomiting Persistent bleeding from incisions Jaundice (yellowing of the whites of the eyes or skin)  Follow-up You have an appointment scheduled with Dr. Dasie on July 02, 2024 at 3:20pm. This will be at the [facility] office at [address], [place], [name]. Please arrive at least 15 minutes prior to your scheduled appointment time.  IF YOU HAVE DISABILITY OR FAMILY LEAVE FORMS, YOU MUST BRING THEM TO THE OFFICE FOR PROCESSING.   DO NOT GIVE THEM TO YOUR DOCTOR.  The clinic staff is available to answer your questions during regular business hours.  Please don't hesitate to call and ask to speak to one of the nurses for clinical concerns.  If you have a medical emergency, go to the nearest emergency room or call 911.  A surgeon from [facility] is always on call at the hospital  [address], [place], [name]  [address] ?  [address] [address], [place], [name]   [address] [phone] ? Toll Free: [phone] ? FAX [phone] Web site: [email or web address]      Managing Your Pain After Surgery Without Opioids    Thank you for participating in our program to help patients manage their pain after surgery without opioids. This is part of our effort to provide you with the best care possible, without exposing you or your family to the risk that opioids pose.  What pain can I expect after surgery? You can expect to have some pain after surgery. This is normal. The pain is typically worse the day after surgery, and quickly begins to get better. Many studies have  found that many patients are able to manage their pain after surgery with Over-the-Counter (OTC) medications such as Tylenol  and Motrin. If you have a condition that does not allow you to take Tylenol  or Motrin, notify your surgical team.  How will I manage my pain? The best strategy for controlling your pain after surgery is around the clock pain control with  Tylenol  (acetaminophen ) and Motrin (ibuprofen or Advil). Alternating these medications with each other allows you to maximize your pain control. In addition to Tylenol  and Motrin, you can use heating pads or ice packs on your incisions to help reduce your pain.  How will I alternate your regular strength over-the-counter pain medication? You will take a dose of pain medication every three hours. Start by taking 650 mg of Tylenol  (2 pills of 325 mg) 3 hours later take 600 mg of Motrin (3 pills of 200 mg) 3 hours after taking the Motrin take 650 mg of Tylenol  3 hours after that take 600 mg of Motrin.   - 1 -  See example - if your first dose of Tylenol  is at 12:00 PM   12:00 PM Tylenol  650 mg (2 pills of 325 mg)  3:00 PM Motrin 600 mg (3 pills of 200 mg)  6:00 PM Tylenol  650 mg (2 pills of 325 mg)  9:00 PM Motrin 600 mg (3 pills of 200 mg)  Continue alternating every 3 hours   We recommend that you follow this schedule around-the-clock for at least 3 days after surgery, or until you feel that it is no longer needed. Use the table on the last page of this handout to keep track of the medications you are taking. Important: Do not take more than 3000mg  of Tylenol  or 3200mg  of Motrin in a 24-hour period. Do not take ibuprofen/Motrin if you have a history of bleeding stomach ulcers, severe kidney disease, &/or actively taking a blood thinner  What if I still have pain? If you have pain that is not controlled with the over-the-counter pain medications (Tylenol  and Motrin or Advil) you might have what we call "breakthrough" pain. You will receive a prescription for a small amount of an opioid pain medication such as Oxycodone , Tramadol , or Tylenol  with Codeine. Use these opioid pills in the first 24 hours after surgery if you have breakthrough pain. Do not take more than 1 pill every 4-6 hours.  If you still have uncontrolled pain after using all opioid pills, don't hesitate to call our staff  using the number provided. We will help make sure you are managing your pain in the best way possible, and if necessary, we can provide a prescription for additional pain medication.   Day 1    Time  Name of Medication Number of pills taken  Amount of Acetaminophen   Pain Level   Comments  AM PM       AM PM       AM PM       AM PM       AM PM       AM PM       AM PM       AM PM       Total Daily amount of Acetaminophen  Do not take more than  3,000 mg per day      Day 2    Time  Name of Medication Number of pills taken  Amount of Acetaminophen   Pain Level   Comments  AM PM  AM PM       AM PM       AM PM       AM PM       AM PM       AM PM       AM PM       Total Daily amount of Acetaminophen  Do not take more than  3,000 mg per day      Day 3    Time  Name of Medication Number of pills taken  Amount of Acetaminophen   Pain Level   Comments  AM PM       AM PM       AM PM       AM PM         AM PM       AM PM       AM PM       AM PM       Total Daily amount of Acetaminophen  Do not take more than  3,000 mg per day      Day 4    Time  Name of Medication Number of pills taken  Amount of Acetaminophen   Pain Level   Comments  AM PM       AM PM       AM PM       AM PM       AM PM       AM PM       AM PM       AM PM       Total Daily amount of Acetaminophen  Do not take more than  3,000 mg per day      Day 5    Time  Name of Medication Number of pills taken  Amount of Acetaminophen   Pain Level   Comments  AM PM       AM PM       AM PM       AM PM       AM PM       AM PM       AM PM       AM PM       Total Daily amount of Acetaminophen  Do not take more than  3,000 mg per day      Day 6    Time  Name of Medication Number of pills taken  Amount of Acetaminophen   Pain Level  Comments  AM PM       AM PM       AM PM       AM PM       AM PM       AM PM       AM PM       AM PM       Total Daily amount of  Acetaminophen  Do not take more than  3,000 mg per day      Day 7    Time  Name of Medication Number of pills taken  Amount of Acetaminophen   Pain Level   Comments  AM PM       AM PM       AM PM       AM PM       AM PM       AM PM       AM PM       AM PM  Total Daily amount of Acetaminophen  Do not take more than  3,000 mg per day        For additional information about how and where to safely dispose of unused opioid medications - [email or web address]  Disclaimer: This document contains information and/or instructional materials adapted from [facility] for the typical patient with your condition. It does not replace medical advice from your health care provider because your experience may differ from that of the typical patient. Talk to your health care provider if you have any questions about this document, your condition or your treatment plan. Adapted from [facility]

## 2024-06-11 NOTE — Anesthesia Procedure Notes (Signed)
 Procedure Name: Intubation Date/Time: 06/11/2024 12:38 PM  Performed by: Joshua Vernell BROCKS, CRNAPre-anesthesia Checklist: Patient identified, Emergency Drugs available, Suction available and Patient being monitored Patient Re-evaluated:Patient Re-evaluated prior to induction Oxygen  Delivery Method: Circle system utilized Preoxygenation: Pre-oxygenation with 100% oxygen  Induction Type: IV induction Ventilation: Mask ventilation without difficulty Laryngoscope Size: Mac and 4 Grade View: Grade I Tube type: Oral Tube size: 7.5 mm Number of attempts: 1 Airway Equipment and Method: Stylet Placement Confirmation: ETT inserted through vocal cords under direct vision, positive ETCO2 and breath sounds checked- equal and bilateral Secured at: 21 cm Tube secured with: Tape Dental Injury: Teeth and Oropharynx as per pre-operative assessment

## 2024-06-12 DIAGNOSIS — K8010 Calculus of gallbladder with chronic cholecystitis without obstruction: Secondary | ICD-10-CM | POA: Diagnosis not present

## 2024-06-12 LAB — SURGICAL PATHOLOGY

## 2024-06-12 MED ORDER — OXYCODONE HCL 5 MG PO TABS: 5.0000 mg | ORAL_TABLET | Freq: Four times a day (QID) | ORAL | 0 refills | Status: AC | PRN

## 2024-06-12 MED ORDER — METHOCARBAMOL 500 MG PO TABS: 500.0000 mg | ORAL_TABLET | Freq: Four times a day (QID) | ORAL | 0 refills | Status: AC | PRN

## 2024-06-12 MED ORDER — DOCUSATE SODIUM 100 MG PO CAPS
100.0000 mg | ORAL_CAPSULE | Freq: Two times a day (BID) | ORAL | 0 refills | Status: AC
Start: 2024-06-12 — End: ?

## 2024-06-12 MED ORDER — POLYETHYLENE GLYCOL 3350 17 G PO PACK: 17.0000 g | PACK | Freq: Every day | ORAL | Status: AC | PRN

## 2024-06-12 NOTE — TOC Transition Note (Signed)
 Transition of Care [facility]) - Discharge Note   Patient Details  Name: Glen Lopez MRN: [id number] Date of Birth: 1956-03-25  Transition of Care [facility] CM/SW Contact:  Alfonse JONELLE Rex, RN Phone Number: 06/12/2024, 10:38 AM   Clinical Narrative: Patient dc home prior to [facility] initial assessment. No TOC Consult/Needs.             Patient Goals and CMS Choice            Discharge Placement                       Discharge Plan and Services Additional resources added to the After Visit Summary for                                       Social Drivers of Health (SDOH) Interventions SDOH Screenings   Food Insecurity: No Food Insecurity (06/11/2024)  Housing: Low Risk  (06/11/2024)  Transportation Needs: No Transportation Needs (06/11/2024)  Utilities: Not At Risk (06/11/2024)  Social Connections: Moderately Isolated (06/11/2024)  Tobacco Use: High Risk (06/11/2024)     Readmission Risk Interventions     No data to display

## 2024-06-12 NOTE — Progress Notes (Signed)
 Feeling well this morning. Pain controlled. Vitals stable, O2 sats in high 80s on 2.5L oxygen  (home regimen). Patient feels well and denies shortness of breath. Would like to go home today. Discharge plan reviewed, will discharge home today.

## 2024-06-13 NOTE — Discharge Summary (Signed)
 Physician Discharge Summary   Patient ID: Glen Lopez [id number] y.o. 07-Nov-1955  Admit date: 06/11/2024  Discharge date and time: 06/12/2024  8:43 AM   Admitting Physician: Leonor LITTIE Dawn, MD   Discharge Physician: Leonor Dawn, MD  Admission Diagnoses: Liver cirrhosis, symptomatic cholelithiasis, LiRADS-3 liver mass  Discharge Diagnoses: Same  Admission Condition: stable  Discharged Condition: stable  Indication for Admission: Glen Lopez is a 68 yo male with known cirrhosis of the liver who was referred with frequent RUQ abdominal pain, exacerbated by eating. Imaging workup showed cholelithiasis. After an extensive discussion of the risks and benefits of surgery, he consented to proceed with cholecystectomy.  Hospital Course: The patient was taken to the OR on 8/12 for a robotic assisted laparoscopic cholecystectomy.  Please see separately dictated operative report for further details.  Postoperatively he was admitted for observation due to his pulmonary comorbidities.  He was admitted to the MedSurg floor from PACU in stable condition.  He was advanced to a regular diet which he tolerated without difficulty.  On the morning of POD 1, his pain was well-controlled and he was hemodynamically stable with no respiratory distress.  He was tolerating PO intake.  He was examined and deemed appropriate for discharge home.  Consults: None  Significant Diagnostic Studies:  Surgical Pathology: A. GALLBLADDER, CHOLECYSTECTOMY:       Chronic cholecystitis.       Cholelithiasis.   Treatments: analgesia: oxycodone  robaxin  and surgery: Robotic-assisted laparoscopic cholecystectomy  Discharge Exam: General: resting comfortably, NAD Neuro: alert and oriented, no focal deficits Resp: normal work of breathing on nasal cannula Abdomen: soft, nondistended, nontender to palpation. Incisions clean and dry with some ecchymosis but no induration or drainage. Extremities: warm and  well-perfused   Disposition: Discharge disposition: 01-Home or Self Care       Patient Instructions:  Allergies as of 06/12/2024       Reactions   Codeine Nausea And Vomiting        Medication List     STOP taking these medications    traMADol  50 MG tablet Commonly known as: ULTRAM        TAKE these medications    albuterol  (2.5 MG/3ML) 0.083% nebulizer solution Commonly known as: PROVENTIL  Take 3 mLs (2.5 mg total) by nebulization every 6 (six) hours as needed. For shortness of breath   albuterol  108 (90 Base) MCG/ACT inhaler Commonly known as: VENTOLIN  HFA Inhale 2 puffs into the lungs every 6 (six) hours as needed for wheezing or shortness of breath.   ALPRAZolam  0.5 MG tablet Commonly known as: XANAX  Take 1 tablet (0.5 mg total) by mouth 3 (three) times daily as needed for panic attacks What changed:  when to take this reasons to take this   amLODipine  10 MG tablet Commonly known as: NORVASC  Take 10 mg by mouth in the morning.   aspirin  EC 81 MG tablet Take 1 tablet (81 mg total) by mouth daily.   atenolol  100 MG tablet Commonly known as: TENORMIN  Take 100 mg by mouth in the morning.   atorvastatin  80 MG tablet Commonly known as: LIPITOR Take 1 tablet (80 mg total) by mouth daily at 6pm. What changed:  how much to take how to take this when to take this   cetirizine 10 MG tablet Commonly known as: ZYRTEC Take 10 mg by mouth every evening.   docusate sodium  100 MG capsule Commonly known as: COLACE Take 1 capsule (100 mg total) by mouth 2 (two) times daily.  ezetimibe  10 MG tablet Commonly known as: ZETIA  Take 10 mg by mouth in the morning.   famotidine  20 MG tablet Commonly known as: PEPCID  TAKE 1 TABLET(20 MG) BY MOUTH AT BEDTIME What changed: See the new instructions.   Flutter Devi Use 3 times a day.   lisinopril  5 MG tablet Commonly known as: ZESTRIL  Take 1 tablet (5 mg total) by mouth daily.   methocarbamol  500 MG  tablet Commonly known as: ROBAXIN  Take 1 tablet (500 mg total) by mouth every 6 (six) hours as needed for muscle spasms.   nitroGLYCERIN  0.4 MG SL tablet Commonly known as: NITROSTAT  Place 1 tablet (0.4 mg) under your tongue for chest pain, every 5 min x3 doses. Call EMS on your second dose.   oxyCODONE  5 MG immediate release tablet Commonly known as: Oxy IR/ROXICODONE  Take 1 tablet (5 mg total) by mouth every 6 (six) hours as needed for severe pain (pain score 7-10).   polyethylene glycol 17 g packet Commonly known as: MIRALAX  / GLYCOLAX  Take 17 g by mouth daily as needed for mild constipation.   pregabalin  100 MG capsule Commonly known as: LYRICA  Take 100 mg by mouth 2 (two) times daily.   roflumilast  500 MCG Tabs tablet Commonly known as: DALIRESP  Take 1 tablet (500 mcg total) by mouth daily.   sertraline  50 MG tablet Commonly known as: ZOLOFT  Take 1 tablet (50 mg total) by mouth daily.   Trelegy Ellipta  200-62.5-25 MCG/ACT Aepb Generic drug: Fluticasone -Umeclidin-Vilant Inhale 1 puff into the lungs daily.       Activity: no driving while on analgesics and no heavy lifting for 4 weeks Diet: regular diet Wound Care: keep wound clean and dry  Follow-up with Dr. Dasie on 07/02/24.  Signed: Leonor LITTIE Dasie 06/13/2024 2:12 PM

## 2024-06-25 ENCOUNTER — Other Ambulatory Visit: Payer: Self-pay | Admitting: Internal Medicine

## 2024-07-11 ENCOUNTER — Other Ambulatory Visit: Payer: Self-pay | Admitting: Internal Medicine

## 2024-07-15 ENCOUNTER — Other Ambulatory Visit: Payer: Self-pay | Admitting: Primary Care

## 2024-08-13 ENCOUNTER — Ambulatory Visit: Admitting: Internal Medicine

## 2024-08-13 DIAGNOSIS — J449 Chronic obstructive pulmonary disease, unspecified: Secondary | ICD-10-CM

## 2024-09-18 ENCOUNTER — Ambulatory Visit: Admitting: Primary Care

## 2024-09-19 ENCOUNTER — Telehealth: Payer: Self-pay | Admitting: Primary Care

## 2024-09-19 ENCOUNTER — Ambulatory Visit (INDEPENDENT_AMBULATORY_CARE_PROVIDER_SITE_OTHER): Admitting: Primary Care

## 2024-09-19 ENCOUNTER — Encounter: Payer: Self-pay | Admitting: Primary Care

## 2024-09-19 VITALS — BP 128/68 | HR 93 | Temp 97.6°F | Ht 66.0 in | Wt 188.8 lb

## 2024-09-19 DIAGNOSIS — J441 Chronic obstructive pulmonary disease with (acute) exacerbation: Secondary | ICD-10-CM | POA: Diagnosis not present

## 2024-09-19 DIAGNOSIS — R911 Solitary pulmonary nodule: Secondary | ICD-10-CM

## 2024-09-19 DIAGNOSIS — F17210 Nicotine dependence, cigarettes, uncomplicated: Secondary | ICD-10-CM | POA: Diagnosis not present

## 2024-09-19 DIAGNOSIS — J9611 Chronic respiratory failure with hypoxia: Secondary | ICD-10-CM

## 2024-09-19 DIAGNOSIS — J439 Emphysema, unspecified: Secondary | ICD-10-CM

## 2024-09-19 MED ORDER — AMOXICILLIN-POT CLAVULANATE 875-125 MG PO TABS: 1.0000 | ORAL_TABLET | Freq: Two times a day (BID) | ORAL | 0 refills | Status: AC

## 2024-09-19 MED ORDER — VARENICLINE TARTRATE (STARTER) 0.5 MG X 11 & 1 MG X 42 PO TBPK: ORAL_TABLET | ORAL | 0 refills | Status: AC

## 2024-09-19 MED ORDER — PREDNISONE 10 MG PO TABS: ORAL_TABLET | ORAL | 0 refills | Status: AC

## 2024-09-19 NOTE — Progress Notes (Signed)
 @Patient  ID: Glen Lopez, male    DOB: 01/06/1956, 68 y.o.   MRN: [id number]  Chief Complaint  Patient presents with   COPD   Medical Management of Chronic Issues    Respiratory failure. Uses 2.5 L O2 HS    Referring provider: Campbell Reynolds, NP  HPI: 68 year old male, current everyday smoker. PMH severe COPD, chronic resp failure with hypoxia, HTN, tobacco use, post nasal drip.   Patient of Dr. Geronimo. Maintained on Trelegy 200 1 puff daily, prn ventolin  and hypertonic saline nebulizer as needed.   Continue to encourage complete smoking cessation. Following with lung cancer screening clinic.   Previous [facility] pulmonary encounter:  08/09/2023 Patient presents today for follow-up Doing alright, appears at baseline Trelegy works best for him, prefers this over Breztri   Using Albuterol  2-3 times a day  He has a chronic cough which is worse at night Weather does affect his breathing He is able to cough up mucus and clear his airways He uses flutter valve as needed  He will take mucinex as needed  Able to do most ADLS, he cuts his grass but will need to stop to rest Walks his dogs morning and evenings about half a mile He wears 2.5L oxygen  at night. Takes xanax  at bedtime for insomnia  He has custody for his two grandchildren ages 54 and 89 He is still smoking 3-4 cigarettes. He quit smoking for 1 month back in 2022. Being round people who smoke will trigger him. He has tried chantix, patches before Denies fevers, weight loss or hemopytsis   COPD  Respiratory symptoms- Chest congestion. Dyspnea baseline.  Last exacerbation-  > 6 months ago  Maintenance medications - Trelegy 200mcg, daliresp  500mcg SABA - 2-3 times a day  Oxygen  - He wears 2.5L oxygen  at night; no daytime requirements Prednisone  - No recent use, sending in RX today for wheezing heard on exam  Smoking status- Current smoker, smoking 3-4 cigarettes a day     02/05/2024 -       Chief Complaint   Patient presents with   Follow-up      Increased cough over the past 3-4 wks. He had been producing yellow sputum- now clear.     Glen Lopez 68 y.o. -returns for regular follow-up.  Last PFT 2019.  He continues to smoke.  Have not seen him in a while.  He tells me for the last 3 weeks he is got increased cough, congestion, and yellow sputum but no fever no sick contacts.  Coincides with onset of allergy season.  He says he called the office and was told to wait for today's follow-up.  He could not and then he took 10 days of his wife's penicillin which ended 6 days ago and he is reports it has not helped him at all.  He is got significant cough review of the medications show that he is on lisinopril  for the last 1 year he says.  He is also on Daliresp  and Trelegy and Flonase  which she says he is compliant with.  We discussed Biologics Dupixent if he qualifies and Othuvayre and he is interested in these.  He is due for lung cancer screening CT in June 2025.  Otherwise no other new issues.      05/13/2024 Discussed the use of AI scribe software for clinical note transcription with the patient, who gave verbal consent to proceed.  History of Present Illness   Glen Lopez is a  68 year old male with severe COPD who presents for a preoperative evaluation for cholecystectomy.  He is undergoing a preoperative evaluation in preparation for a cholecystectomy, which requires clearance from his cardiologist and pulmonologist. He has severe COPD and uses two liters of oxygen  at night.  His COPD management includes Trelegy, Daliresp , and Flonase  nasal spray. He recently started using a nebulizer called Otuver, intended for twice-daily use, although he sometimes forgets the second dose. Lung cancer screening in June revealed severe emphysema with bronchial wall thickening, stable scarring in the right middle lobe, and stable scattered pulmonary nodules in the right lower lobe, with no new nodules  since his baseline CT scan in 2019. His lung function was 35% in 2019 and is currently 41% pre-bronchodilator.  He reports stable breathing since April, with shortness of breath during overexertion, such as walking his dog. He has a chronic cough that is intermittent and not currently producing colored mucus. No respiratory infections requiring antibiotics in the last four weeks.  He has a history of cholelithiasis, cirrhosis of the liver, and coronary artery disease. He is on Lipitor for cholesterol management and denies being on any blood thinners.  He smokes a couple of cigarettes a day and has previously quit for a month but resumed due to social influences.      09/19/2024- interim hx  Discussed the use of AI scribe software for clinical note transcription with the patient, who gave verbal consent to proceed.  History of Present Illness Glen Lopez is a 68 year old male with severe COPD who presents for a routine follow-up.  He has severe COPD with no significant changes since his last visit in July. He experiences congestion, which he attributes to weather changes, and uses Trelegy 200 micrograms once daily, Daliresp , and a nebulizer twice a day. He also uses a flutter valve as needed for congestion. He wakes up feeling like he is choking due to phlegm, which he attributes to weather changes, and uses albuterol  for breathing treatments at least once a day, typically in the middle of the day or late evening.  He continues to smoke three to four cigarettes a day despite efforts to quit. He has a long history of smoking, starting at the age of 89. His wife and grandchildren also smoke, which makes quitting more challenging. He has tried various methods to quit smoking, including patches and medications, but has not been successful long-term. He previously quit for a month when his wife and grandson were not smoking around him.  He is part of a lung cancer screening program and had a CT  scan in June, which showed severe emphysema and a stable pulmonary nodule measuring eight millimeters in the right lower lobe, unchanged since 2019. There were no new significant pulmonary nodules or consolidated air spaces.  He has a history of myocardial infarction with stent placement and PTSD. He takes Zoloft  and Xanax  for PTSD and reports no alcohol use for years. He recently had his gallbladder removed with no complications.  He has custody of his grandchildren, which adds stress to his life. He experiences wheezing and congestion, particularly with weather changes, and uses Mucinex frequently. He notes increased congestion currently.   TEST/EVENTS:  09/24/2018 PFT: FVC 57, FEV1 35, ratio 53, TLC 124, DLCOunc 52. Positive BD 11/18/2021 LDCT chest: moderate centrilobular emphysema. Similar lingular and RML scarring with volume loss. Subpleural lymph nodes along left major fissure. Nodule in RLL that was previously of concern has resolved. Other  nodules, max 5.3, unchanged. Lung RADS 2. Cholelithiasis. Left nephrolithiasis. Cirrhosis. Atherosclerosis.  12/08/2022 CXR: lungs emphysematous but clear  05/13/2024 Spriometry>> FVC 2.28 (62%), FEV2 1.12 (41%), ratio 49, DLCO 13.38 (60%)  Allergies  Allergen Reactions   Codeine Nausea And Vomiting    Immunization History  Administered Date(s) Administered   Fluad Trivalent(High Dose 65+) 08/20/2024   INFLUENZA, HIGH DOSE SEASONAL PF 07/19/2018, 09/30/2021   Influenza Split 09/23/2017   Influenza, Quadrivalent, Recombinant, Inj, Pf 07/29/2019   Influenza,inj,Quad PF,6+ Mos 09/09/2015, 07/14/2016   Influenza-Unspecified 07/31/2014, 07/31/2020   PFIZER(Purple Top)SARS-COV-2 Vaccination 01/15/2020, 02/16/2020, 08/14/2020   Pfizer(Comirnaty)Fall Seasonal Vaccine 12 years and older 08/20/2024   Pneumococcal Conjugate-13 07/19/2018   Pneumococcal Polysaccharide-23 10/31/2013, 07/19/2018    Past Medical History:  Diagnosis Date   Anxiety     Arthritis    Asthma    Bipolar disorder (HCC)    COPD (chronic obstructive pulmonary disease) (HCC)    Depression    GERD (gastroesophageal reflux disease)    Headache(784.0)    Hiatal hernia    Hypertension     Tobacco History: Social History   Tobacco Use  Smoking Status Every Day   Current packs/day: 3.00   Average packs/day: 3.0 packs/day for 50.0 years (150.0 ttl pk-yrs)   Types: Cigarettes   Passive exposure: Past  Smokeless Tobacco Never  Tobacco Comments   6 cigs per day as of 12/08/22 ep   Ready to quit: Not Answered Counseling given: Not Answered Tobacco comments: 6 cigs per day as of 12/08/22 ep   Outpatient Medications Prior to Visit  Medication Sig Dispense Refill   albuterol  (PROVENTIL ) (2.5 MG/3ML) 0.083% nebulizer solution Take 3 mLs (2.5 mg total) by nebulization every 6 (six) hours as needed. For shortness of breath 75 mL 5   albuterol  (VENTOLIN  HFA) 108 (90 Base) MCG/ACT inhaler INHALE 2 PUFFS INTO THE LUNGS EVERY 6 HOURS AS NEEDED FOR WHEEZING OR SHORTNESS OF BREATH 18 g 3   ALPRAZolam  (XANAX ) 0.5 MG tablet Take 1 tablet (0.5 mg total) by mouth 3 (three) times daily as needed for panic attacks (Patient taking differently: Take 0.5 mg by mouth 3 (three) times daily as needed for anxiety.) 90 tablet 3   amLODipine  (NORVASC ) 10 MG tablet Take 10 mg by mouth in the morning.     aspirin  EC 81 MG tablet Take 1 tablet (81 mg total) by mouth daily.     atenolol  (TENORMIN ) 100 MG tablet Take 100 mg by mouth in the morning.     atorvastatin  (LIPITOR) 80 MG tablet Take 1 tablet (80 mg total) by mouth daily at 6pm. (Patient taking differently: Take 80 mg by mouth in the morning.) 30 tablet 11   cetirizine (ZYRTEC) 10 MG tablet Take 10 mg by mouth every evening.     docusate sodium  (COLACE) 100 MG capsule Take 1 capsule (100 mg total) by mouth 2 (two) times daily. 10 capsule 0   ezetimibe  (ZETIA ) 10 MG tablet Take 10 mg by mouth in the morning.     famotidine  (PEPCID ) 20  MG tablet TAKE 1 TABLET(20 MG) BY MOUTH AT BEDTIME (Patient taking differently: Take 20 mg by mouth in the morning.) 30 tablet 5   Fluticasone -Umeclidin-Vilant (TRELEGY ELLIPTA ) 200-62.5-25 MCG/ACT AEPB INHALE 1 PUFF INTO THE LUNGS DAILY 60 each 9   lisinopril  (ZESTRIL ) 5 MG tablet Take 1 tablet (5 mg total) by mouth daily. 90 tablet 3   methocarbamol  (ROBAXIN ) 500 MG tablet Take 1 tablet (500 mg total) by  mouth every 6 (six) hours as needed for muscle spasms. 15 tablet 0   nitroGLYCERIN  (NITROSTAT ) 0.4 MG SL tablet Place 1 tablet (0.4 mg) under your tongue for chest pain, every 5 min x3 doses. Call EMS on your second dose. 25 tablet 11   oxyCODONE  (OXY IR/ROXICODONE ) 5 MG immediate release tablet Take 1 tablet (5 mg total) by mouth every 6 (six) hours as needed for severe pain (pain score 7-10). 12 tablet 0   polyethylene glycol (MIRALAX  / GLYCOLAX ) 17 g packet Take 17 g by mouth daily as needed for mild constipation.     pregabalin  (LYRICA ) 100 MG capsule Take 100 mg by mouth 2 (two) times daily.     Respiratory Therapy Supplies (FLUTTER) DEVI Use 3 times a day. 1 each 0   roflumilast  (DALIRESP ) 500 MCG TABS tablet TAKE 1 TABLET(500 MCG) BY MOUTH DAILY 30 tablet 3   sertraline  (ZOLOFT ) 50 MG tablet Take 1 tablet (50 mg total) by mouth daily. 90 tablet 3   No facility-administered medications prior to visit.    Review of Systems  Review of Systems  HENT:  Positive for congestion.   Respiratory:  Positive for cough.    Physical Exam  BP 128/68   Pulse 93   Temp 97.6 F (36.4 C)   Ht 5' 6 (1.676 m)   Wt 188 lb 12.8 oz (85.6 kg)   SpO2 90% Comment: RA  BMI 30.47 kg/m  Physical Exam Constitutional:      Appearance: Normal appearance.  HENT:     Head: Normocephalic and atraumatic.  Cardiovascular:     Rate and Rhythm: Normal rate and regular rhythm.  Pulmonary:     Effort: Pulmonary effort is normal.     Breath sounds: Normal breath sounds.  Neurological:     General: No focal  deficit present.     Mental Status: He is alert and oriented to person, place, and time. Mental status is at baseline.  Psychiatric:        Mood and Affect: Mood normal.        Behavior: Behavior normal.        Thought Content: Thought content normal.        Judgment: Judgment normal.     Lab Results:  CBC    Component Value Date/Time   WBC 8.1 02/05/2024 1137   RBC 5.55 02/05/2024 1137   HGB 17.6 (H) 02/05/2024 1137   HCT 51.9 02/05/2024 1137   PLT 163.0 02/05/2024 1137   MCV 93.5 02/05/2024 1137   MCH 32.0 12/09/2015 0557   MCHC 34.0 02/05/2024 1137   RDW 15.4 02/05/2024 1137   LYMPHSABS 2.1 02/05/2024 1137   MONOABS 0.6 02/05/2024 1137   EOSABS 0.1 02/05/2024 1137   BASOSABS 0.0 02/05/2024 1137    BMET    Component Value Date/Time   NA 139 12/09/2015 0557   K 4.7 12/09/2015 0557   CL 109 12/09/2015 0557   CO2 24 12/09/2015 0557   GLUCOSE 176 (H) 12/09/2015 0557   BUN 17 12/09/2015 0557   CREATININE 0.94 12/09/2015 0557   CALCIUM  8.4 (L) 12/09/2015 0557   GFRNONAA >60 12/09/2015 0557   GFRAA >60 12/09/2015 0557    BNP No results found for: BNP  ProBNP    Component Value Date/Time   PROBNP 125.2 (H) 10/31/2013 1645    Imaging: No results found.   Assessment & Plan:   1. Chronic obstructive pulmonary disease with emphysema, unspecified emphysema type (HCC) (Primary)  2.  Chronic respiratory failure with hypoxia (HCC)  3. COPD with acute exacerbation (HCC)  Assessment and Plan Assessment & Plan Severe chronic obstructive pulmonary disease (COPD) with emphysema and chronic respiratory failure with hypoxia Severe COPD with emphysema and chronic respiratory failure with hypoxia. Continues to smoke despite understanding the irreversible damage. Currently on Trelegy, Daliresp , and Otuvir. Uses a flutter valve as needed for congestion. Oxygen  therapy is in use. Recent CT scan showed severe emphysema, no lung masses, and a stable pulmonary nodule. No new  significant pulmonary nodules. Emphysema is a major concern due to its irreversible nature. - Continue Trelegy 200 mcg daily. - Continue Daliresp  500 mcg daily. - Temporarily discontinue Ohtuvayre  nebulizer to avoid duplication of PDE4 inhibitors and increased risk for side effects  - Prescribed Augmentin  and prednisone  for acute bronchitic symptoms. - Encouraged smoking cessation efforts. - Continue supplemental oxygen  2.5L at bedtime   Acute bronchitis  Experiencing acute bronchitic symptoms with congestion and wheezing, likely exacerbated by weather changes. Reports clear sputum production and wheezing on examination. At risk for decompensation due to severe COPD/emphysema.  - Prescribed Augmentin  and prednisone  for acute bronchitic symptoms. - Continue mucinex and flutter valve  - Continue albuterol  as needed for bronchodilation.  Nicotine dependence (tobacco use disorder) Long-standing nicotine dependence with smoking since age 37. Has attempted cessation multiple times with limited success. Currently smoking 3-4 cigarettes daily. Discussed the challenges of quitting and the potential benefits of cessation. Chantix has a 20-30% success rate for smoking cessation. No history of kidney disease or seizures. No current depression or suicidal thoughts.  - Prescribed Chantix starter pack and maintenance dose for 12 weeks. - Advised to monitor for any increase in depression or suicidal thoughts while on Chantix. - Encouraged reading 'Dale Ruth' book for additional support. - Discussed potential timing of Chantix initiation, possibly after the holidays.  Stable right lower lobe pulmonary nodule Right lower lobe pulmonary nodule measuring 8 mm, unchanged since 2019. No new significant pulmonary nodules on recent CT scan. - Continue monitoring with lung cancer screening program.  Smoking/Tobacco Cessation Counseling Glen Lopez is a current user of tobacco or nicotine products. He is  considering quitting at this time. Counseling provided today addressed the risks of continued use and the benefits of cessation. Discussed tobacco/nicotine use history, readiness to quit, and evidence-based treatment options including behavioral strategies, support resources, and pharmacologic therapies. Provided encouragement and educational materials on steps and resources to quit smoking. Patient questions were addressed, and follow-up recommended for continued support. Total time spent on counseling: 8 minutes.   Almarie LELON Ferrari, NP 09/19/2024

## 2024-09-19 NOTE — Patient Instructions (Addendum)
  VISIT SUMMARY: During your visit today, we reviewed your severe COPD and discussed your current symptoms, including congestion and wheezing. We also addressed your ongoing nicotine dependence and the stable pulmonary nodule in your right lower lobe. We made some adjustments to your medications and provided new prescriptions to help manage your symptoms.  YOUR PLAN: -SEVERE CHRONIC OBSTRUCTIVE PULMONARY DISEASE (COPD) WITH EMPHYSEMA AND CHRONIC RESPIRATORY FAILURE WITH HYPOXIA: COPD is a chronic lung disease that makes it hard to breathe and is often caused by smoking. You should continue taking Trelegy 200 mcg daily and Daliresp  500 mcg daily. Recommend temporarily discontinuing the Ohtuvayre  nebulizer to avoid duplication of medications. Continue using oxygen  therapy as needed. We have prescribed Augmentin  and prednisone  to help with your acute bronchitic symptoms. Please continue your efforts to quit smoking as it will help manage your COPD.  -ACUTE BRONCHITIC SYMPTOMS WITH CONGESTION AND WHEEZING: Acute bronchitis is an inflammation of the airways that causes coughing and congestion. We have prescribed Augmentin  and prednisone  to help with these symptoms. Continue using albuterol  as needed for relief.  -NICOTINE DEPENDENCE (TOBACCO USE DISORDER): Nicotine dependence is an addiction to tobacco products. We have prescribed a Chantix starter pack and a maintenance dose for 12 weeks to help you quit smoking. Please monitor for any increase in depression or suicidal thoughts while on Chantix. We also recommend reading 'Dale Ruth' book for additional support. Consider starting Chantix after the holidays for better timing.  -STABLE RIGHT LOWER LOBE PULMONARY NODULE: A pulmonary nodule is a small growth in the lung. Your nodule has been stable since 2019, and we will continue to monitor it through the lung cancer screening program.  INSTRUCTIONS: Please follow up as recommended with the lung cancer  screening program to monitor your pulmonary nodule. Continue your current medications and new prescriptions as directed. If you experience any new or worsening symptoms, please contact our office.  Follow-up: 3 months with Dr. Geronimo / continue to see beth as needed

## 2024-09-19 NOTE — Telephone Encounter (Signed)
 Not sure if patient is taking ohtuvayre , but I would like to discontinue. Its not on his medication list and I told him to stop taking it because he is currently on PDE4 with daliresp . Thanks

## 2024-09-20 NOTE — Telephone Encounter (Signed)
 No documentation from pharmacy team and per chart review, that we initiated Ohtuvayre  BIV. Thank you for update

## 2024-09-30 ENCOUNTER — Other Ambulatory Visit: Payer: Self-pay | Admitting: Primary Care

## 2024-09-30 ENCOUNTER — Other Ambulatory Visit: Payer: Self-pay | Admitting: Internal Medicine

## 2024-09-30 NOTE — Telephone Encounter (Unsigned)
 Copied from CRM #[id number]. Topic: Clinical - Medication Refill >> Sep 30, 2024 10:19 AM [name] wrote: Medication: albuterol  (VENTOLIN  HFA) 108 (90 Base) MCG/ACT inhaler  Has the patient contacted their pharmacy? Yes (Agent: If no, request that the patient contact the pharmacy for the refill. If patient does not wish to contact the pharmacy document the reason why and proceed with request.) (Agent: If yes, when and what did the pharmacy advise?)  This is the patient's preferred pharmacy:   [facility] STORE #[id number] [name], [place] - [address] AT [facility] [address] [name] [address] Phone: [phone] Fax: [phone]  Is this the correct pharmacy for this prescription? Yes If no, delete pharmacy and type the correct one.   Has the prescription been filled recently? Yes, last month  Is the patient out of the medication? Yes  Has the patient been seen for an appointment in the last year OR does the patient have an upcoming appointment? Yes  Can we respond through MyChart? Yes  Agent: Please be advised that Rx refills may take up to 3 business days. We ask that you follow-up with your pharmacy.

## 2024-10-14 ENCOUNTER — Other Ambulatory Visit: Payer: Self-pay | Admitting: Surgery

## 2024-10-14 DIAGNOSIS — K7689 Other specified diseases of liver: Secondary | ICD-10-CM

## 2024-10-15 ENCOUNTER — Inpatient Hospital Stay: Admission: RE | Admit: 2024-10-15 | Source: Ambulatory Visit

## 2024-10-21 ENCOUNTER — Inpatient Hospital Stay: Admission: RE | Admit: 2024-10-21 | Discharge: 2024-10-21 | Attending: Acute Care | Admitting: Acute Care

## 2024-10-21 DIAGNOSIS — R911 Solitary pulmonary nodule: Secondary | ICD-10-CM

## 2024-10-30 ENCOUNTER — Other Ambulatory Visit: Payer: Self-pay | Admitting: Acute Care

## 2024-10-30 DIAGNOSIS — Z87891 Personal history of nicotine dependence: Secondary | ICD-10-CM

## 2024-10-30 DIAGNOSIS — F17210 Nicotine dependence, cigarettes, uncomplicated: Secondary | ICD-10-CM

## 2024-10-30 DIAGNOSIS — Z122 Encounter for screening for malignant neoplasm of respiratory organs: Secondary | ICD-10-CM

## 2024-11-19 ENCOUNTER — Other Ambulatory Visit

## 2024-11-20 ENCOUNTER — Ambulatory Visit: Payer: Self-pay | Admitting: Internal Medicine

## 2024-11-20 ENCOUNTER — Encounter: Payer: Self-pay | Admitting: Internal Medicine

## 2024-11-20 DIAGNOSIS — J441 Chronic obstructive pulmonary disease with (acute) exacerbation: Secondary | ICD-10-CM

## 2024-11-20 MED ORDER — DOXYCYCLINE HYCLATE 100 MG PO TABS: ORAL_TABLET | ORAL | 0 refills | Status: DC

## 2024-11-20 MED ORDER — PREDNISONE 10 MG PO TABS: ORAL_TABLET | ORAL | 0 refills | Status: AC

## 2024-11-20 NOTE — Telephone Encounter (Signed)
 Called the pt to offer appt and there was no answer- LMTCB.  When he calls back please schedule acute visit.

## 2024-11-20 NOTE — Telephone Encounter (Signed)
 FYI Only or Action Required?: Action required by provider: clinical question for provider and update on patient condition.  Patient is followed in Pulmonology for COPD, last seen on 09/19/2024 by Hope Almarie ORN, NP.  Called Nurse Triage reporting Shortness of Breath.  Symptoms began several days ago.  Interventions attempted: Home oxygen  use and Increased fluids/rest.  Symptoms are: rapidly worsening.  Triage Disposition: Go to ED Now (or PCP Triage)  Patient/caregiver understands and will follow disposition?: No, wishes to speak with PCP     Reason for Disposition  Patient sounds very sick or weak to the triager  Answer Assessment - Initial Assessment Questions This RN recommended pt be examined asap, pt refusing, requesting zpak to [facility] on [facility]. Advised pt call 911 or get to hospital asap if any new or worsening symptoms. Sending message to [facility] office for call back to pt with further recommendations.       1. RESPIRATORY STATUS: Describe your breathing? (e.g., wheezing, shortness of breath, unable to speak, severe coughing)      Using my oxygen , when not using it, get little tight in chest, normal for me Just a little bit harsh sound while breathing in laying down Not gasping yet Some wheezing with laying down  4. SEVERITY: How bad is your breathing? (e.g., mild, moderate, severe)      Not worst yet  5. RECURRENT SYMPTOM: Have you had difficulty breathing before? If Yes, ask: When was the last time? and What happened that time?      Get this every year, want to treat it before get pneumonia and go to hospital  6. CARDIAC HISTORY: Do you have any history of heart disease? (e.g., heart attack, angina, bypass surgery, angioplasty)      HTN  7. LUNG HISTORY: Do you have any history of lung disease?  (e.g., pulmonary embolus, asthma, emphysema)     Significant  9. OTHER SYMPTOMS: Do you have any other symptoms? (e.g., chest pain,  cough, dizziness, fever, runny nose)     Congestion, can even taste it Sputum is clear Just a little fatigue Coughing spells when lay down  Denies: Too weak to stand Struggling to breathe Gasping Chest pain Hx blood clots in legs or lungs Fever  Protocols used: Breathing Difficulty-A-AH

## 2024-11-20 NOTE — Telephone Encounter (Signed)
 5d doxy and and 5d pred sent to phamracy Go to ER if worse

## 2024-11-20 NOTE — Telephone Encounter (Signed)
 I called and spoke with the pt and notified of response per Dr Marchelle Gearing  He verbalized understanding  Nothing further needed

## 2024-11-21 ENCOUNTER — Ambulatory Visit: Admitting: Primary Care

## 2024-11-21 MED ORDER — DOXYCYCLINE HYCLATE 100 MG PO TABS: ORAL_TABLET | ORAL | 0 refills | Status: AC

## 2024-11-21 NOTE — Telephone Encounter (Signed)
 Copied from CRM [id number]. Topic: Clinical - Prescription Issue >> Nov 21, 2024 10:26 AM Dedra NOVAK wrote: Reason for CRM: Patient said [facility] on [facility] will not fill his doxycycline  because it doesn't have instructions. >> Nov 21, 2024 11:54 AM Rilla B wrote: Patient's wife calling back.  States they are waiting for someone from the office to call and give the instructions for the antibiotic (10 pills) that was prescribed.    Dr. Geronimo, the instructions on the doxycycline  prescription were as follows: Please take prednisone  40 mg x1 day, then 30 mg x1 day, then 20 mg x1 day, then 10 mg x1 day, and then 5 mg x1 day and stop, Normal   Please advise on what the doxycycline  instructions should be so that pt can get it filled at pharmacy. Thank you!

## 2024-11-21 NOTE — Telephone Encounter (Signed)
 Doxy instruction sent

## 2024-11-27 NOTE — Telephone Encounter (Signed)
 See second message from 1/21. The script was fixed.  Nothing further needed.

## 2024-12-20 ENCOUNTER — Ambulatory Visit: Admitting: Internal Medicine
